# Patient Record
Sex: Female | Born: 1980
Health system: Southern US, Community
[De-identification: ages and names within clinical notes are randomized; demographics above are authoritative.]

## PROBLEM LIST (undated history)

## (undated) DIAGNOSIS — E559 Vitamin D deficiency, unspecified: Secondary | ICD-10-CM

## (undated) DIAGNOSIS — N921 Excessive and frequent menstruation with irregular cycle: Secondary | ICD-10-CM

## (undated) DIAGNOSIS — Z9289 Personal history of other medical treatment: Secondary | ICD-10-CM

## (undated) DIAGNOSIS — I10 Essential (primary) hypertension: Secondary | ICD-10-CM

## (undated) DIAGNOSIS — K760 Fatty (change of) liver, not elsewhere classified: Secondary | ICD-10-CM

## (undated) DIAGNOSIS — E119 Type 2 diabetes mellitus without complications: Secondary | ICD-10-CM

## (undated) DIAGNOSIS — D509 Iron deficiency anemia, unspecified: Secondary | ICD-10-CM

## (undated) DIAGNOSIS — D649 Anemia, unspecified: Secondary | ICD-10-CM

## (undated) DIAGNOSIS — E7800 Pure hypercholesterolemia, unspecified: Secondary | ICD-10-CM

## (undated) HISTORY — PX: ABDOMINAL HYSTERECTOMY: SHX81

## (undated) HISTORY — DX: Iron deficiency anemia, unspecified: D50.9

## (undated) HISTORY — DX: Excessive and frequent menstruation with irregular cycle: N92.1

## (undated) HISTORY — DX: Essential (primary) hypertension: I10

## (undated) HISTORY — PX: HIP PINNING: SHX1757

## (undated) HISTORY — DX: Fatty (change of) liver, not elsewhere classified: K76.0

## (undated) HISTORY — DX: Anemia, unspecified: D64.9

## (undated) HISTORY — PX: TONSILLECTOMY AND ADENOIDECTOMY: SUR1326

## (undated) HISTORY — DX: Vitamin D deficiency, unspecified: E55.9

---

## 2004-08-10 HISTORY — PX: TUBAL LIGATION: SHX77

## 2010-07-22 ENCOUNTER — Emergency Department (HOSPITAL_COMMUNITY)
Admission: EM | Admit: 2010-07-22 | Discharge: 2010-07-22 | Payer: Self-pay | Source: Home / Self Care | Admitting: Emergency Medicine

## 2010-09-22 ENCOUNTER — Emergency Department (HOSPITAL_COMMUNITY)
Admission: EM | Admit: 2010-09-22 | Discharge: 2010-09-23 | Disposition: A | Discharge status: Home / Self Care | Payer: Medicaid Other | Attending: Emergency Medicine | Admitting: Emergency Medicine

## 2010-09-22 ENCOUNTER — Emergency Department (HOSPITAL_COMMUNITY): Payer: Medicaid Other

## 2010-09-22 DIAGNOSIS — D259 Leiomyoma of uterus, unspecified: Secondary | ICD-10-CM | POA: Insufficient documentation

## 2010-09-22 DIAGNOSIS — I10 Essential (primary) hypertension: Secondary | ICD-10-CM | POA: Insufficient documentation

## 2010-09-22 DIAGNOSIS — D649 Anemia, unspecified: Secondary | ICD-10-CM | POA: Insufficient documentation

## 2010-09-22 LAB — URINALYSIS, ROUTINE W REFLEX MICROSCOPIC
Bilirubin Urine: NEGATIVE
Nitrite: NEGATIVE
Specific Gravity, Urine: 1.027 (ref 1.005–1.030)
Urine Glucose, Fasting: NEGATIVE mg/dL
pH: 6 (ref 5.0–8.0)

## 2010-09-22 LAB — CBC
Hemoglobin: 7.7 g/dL — ABNORMAL LOW (ref 12.0–15.0)
MCV: 67.4 fL — ABNORMAL LOW (ref 78.0–100.0)
Platelets: 338 10*3/uL (ref 150–400)
RBC: 3.87 MIL/uL (ref 3.87–5.11)
WBC: 17.2 10*3/uL — ABNORMAL HIGH (ref 4.0–10.5)

## 2010-09-22 LAB — DIFFERENTIAL
Basophils Relative: 0 % (ref 0–1)
Eosinophils Absolute: 0 10*3/uL (ref 0.0–0.7)
Lymphocytes Relative: 13 % (ref 12–46)
Monocytes Relative: 3 % (ref 3–12)
Neutrophils Relative %: 84 % — ABNORMAL HIGH (ref 43–77)

## 2010-09-22 LAB — POCT I-STAT, CHEM 8
BUN: 13 mg/dL (ref 6–23)
Calcium, Ion: 1.15 mmol/L (ref 1.12–1.32)
Chloride: 106 mEq/L (ref 96–112)
Creatinine, Ser: 0.8 mg/dL (ref 0.4–1.2)
Glucose, Bld: 102 mg/dL — ABNORMAL HIGH (ref 70–99)
HCT: 27 % — ABNORMAL LOW (ref 36.0–46.0)
Potassium: 3.7 mEq/L (ref 3.5–5.1)

## 2010-09-22 LAB — WET PREP, GENITAL
Clue Cells Wet Prep HPF POC: NONE SEEN
Trich, Wet Prep: NONE SEEN

## 2010-10-09 ENCOUNTER — Inpatient Hospital Stay (HOSPITAL_COMMUNITY)
Admission: AD | Admit: 2010-10-09 | Discharge: 2010-10-09 | Disposition: A | Discharge status: Home / Self Care | Payer: Medicaid Other | Source: Ambulatory Visit | Attending: Obstetrics & Gynecology | Admitting: Obstetrics & Gynecology

## 2010-10-09 DIAGNOSIS — N949 Unspecified condition associated with female genital organs and menstrual cycle: Secondary | ICD-10-CM | POA: Insufficient documentation

## 2010-10-09 DIAGNOSIS — D259 Leiomyoma of uterus, unspecified: Secondary | ICD-10-CM

## 2010-10-09 DIAGNOSIS — N938 Other specified abnormal uterine and vaginal bleeding: Secondary | ICD-10-CM | POA: Insufficient documentation

## 2010-10-09 LAB — CBC
HCT: 29.8 % — ABNORMAL LOW (ref 36.0–46.0)
Hemoglobin: 8.6 g/dL — ABNORMAL LOW (ref 12.0–15.0)
RBC: 4.36 MIL/uL (ref 3.87–5.11)
RDW: 18.8 % — ABNORMAL HIGH (ref 11.5–15.5)
WBC: 7.3 10*3/uL (ref 4.0–10.5)

## 2010-10-09 LAB — URINE MICROSCOPIC-ADD ON

## 2010-10-09 LAB — URINALYSIS, ROUTINE W REFLEX MICROSCOPIC
Bilirubin Urine: NEGATIVE
Specific Gravity, Urine: 1.015 (ref 1.005–1.030)
pH: 5.5 (ref 5.0–8.0)

## 2010-10-09 LAB — DIFFERENTIAL
Basophils Absolute: 0 10*3/uL (ref 0.0–0.1)
Lymphocytes Relative: 26 % (ref 12–46)
Neutro Abs: 4.6 10*3/uL (ref 1.7–7.7)
Neutrophils Relative %: 64 % (ref 43–77)

## 2010-10-09 LAB — SAMPLE TO BLOOD BANK

## 2010-10-31 ENCOUNTER — Encounter: Payer: Self-pay | Admitting: Oncology

## 2010-11-13 ENCOUNTER — Inpatient Hospital Stay (HOSPITAL_COMMUNITY)
Admission: AD | Admit: 2010-11-13 | Discharge: 2010-11-13 | Disposition: A | Discharge status: Home / Self Care | Payer: Medicaid Other | Source: Ambulatory Visit | Attending: Obstetrics and Gynecology | Admitting: Obstetrics and Gynecology

## 2010-11-13 DIAGNOSIS — N938 Other specified abnormal uterine and vaginal bleeding: Secondary | ICD-10-CM | POA: Insufficient documentation

## 2010-11-13 DIAGNOSIS — N949 Unspecified condition associated with female genital organs and menstrual cycle: Secondary | ICD-10-CM | POA: Insufficient documentation

## 2010-11-13 DIAGNOSIS — D259 Leiomyoma of uterus, unspecified: Secondary | ICD-10-CM | POA: Insufficient documentation

## 2010-11-13 LAB — DIFFERENTIAL
Eosinophils Relative: 2 % (ref 0–5)
Monocytes Absolute: 0.4 10*3/uL (ref 0.1–1.0)
Monocytes Relative: 6 % (ref 3–12)
Neutrophils Relative %: 65 % (ref 43–77)

## 2010-11-13 LAB — CBC
HCT: 25.5 % — ABNORMAL LOW (ref 36.0–46.0)
Platelets: 335 10*3/uL (ref 150–400)
RDW: 18.6 % — ABNORMAL HIGH (ref 11.5–15.5)
WBC: 7.4 10*3/uL (ref 4.0–10.5)

## 2010-11-13 LAB — COMPREHENSIVE METABOLIC PANEL
ALT: 18 U/L (ref 0–35)
AST: 17 U/L (ref 0–37)
Albumin: 3.7 g/dL (ref 3.5–5.2)
Alkaline Phosphatase: 61 U/L (ref 39–117)
Glucose, Bld: 139 mg/dL — ABNORMAL HIGH (ref 70–99)
Potassium: 3.7 mEq/L (ref 3.5–5.1)
Sodium: 142 mEq/L (ref 135–145)
Total Protein: 6.4 g/dL (ref 6.0–8.3)

## 2010-11-15 ENCOUNTER — Inpatient Hospital Stay (HOSPITAL_COMMUNITY)
Admission: AD | Admit: 2010-11-15 | Discharge: 2010-11-15 | Disposition: A | Discharge status: Home / Self Care | Payer: Medicaid Other | Source: Ambulatory Visit | Attending: Obstetrics and Gynecology | Admitting: Obstetrics and Gynecology

## 2010-11-15 DIAGNOSIS — M79609 Pain in unspecified limb: Secondary | ICD-10-CM

## 2010-11-15 DIAGNOSIS — N949 Unspecified condition associated with female genital organs and menstrual cycle: Secondary | ICD-10-CM | POA: Insufficient documentation

## 2010-11-15 DIAGNOSIS — N938 Other specified abnormal uterine and vaginal bleeding: Secondary | ICD-10-CM | POA: Insufficient documentation

## 2010-11-15 LAB — CBC
MCH: 19.8 pg — ABNORMAL LOW (ref 26.0–34.0)
MCHC: 29.1 g/dL — ABNORMAL LOW (ref 30.0–36.0)
MCV: 67.9 fL — ABNORMAL LOW (ref 78.0–100.0)
Platelets: 317 10*3/uL (ref 150–400)
RDW: 18.5 % — ABNORMAL HIGH (ref 11.5–15.5)

## 2010-11-15 LAB — BASIC METABOLIC PANEL
BUN: 9 mg/dL (ref 6–23)
Creatinine, Ser: 0.58 mg/dL (ref 0.4–1.2)
GFR calc non Af Amer: >60 mL/min (ref >60)

## 2010-11-21 ENCOUNTER — Encounter (HOSPITAL_BASED_OUTPATIENT_CLINIC_OR_DEPARTMENT_OTHER): Payer: Medicaid Other | Admitting: Oncology

## 2010-11-21 ENCOUNTER — Other Ambulatory Visit: Payer: Self-pay | Admitting: Oncology

## 2010-11-21 DIAGNOSIS — I10 Essential (primary) hypertension: Secondary | ICD-10-CM

## 2010-11-21 DIAGNOSIS — D259 Leiomyoma of uterus, unspecified: Secondary | ICD-10-CM

## 2010-11-21 DIAGNOSIS — D500 Iron deficiency anemia secondary to blood loss (chronic): Secondary | ICD-10-CM

## 2010-11-21 DIAGNOSIS — N920 Excessive and frequent menstruation with regular cycle: Secondary | ICD-10-CM

## 2010-11-21 LAB — CBC & DIFF AND RETIC
BASO%: 0 % (ref 0.0–2.0)
EOS%: 2.1 % (ref 0.0–7.0)
Immature Retic Fract: 16.3 % — ABNORMAL HIGH (ref 0.00–10.70)
LYMPH%: 19.9 % (ref 14.0–49.7)
MCH: 19.4 pg — ABNORMAL LOW (ref 25.1–34.0)
MCHC: 29 g/dL — ABNORMAL LOW (ref 31.5–36.0)
MONO#: 0.3 10*3/uL (ref 0.1–0.9)
RBC: 3.86 10*6/uL (ref 3.70–5.45)
Retic %: 1.86 % — ABNORMAL HIGH (ref 0.50–1.50)
WBC: 4.3 10*3/uL (ref 3.9–10.3)
lymph#: 0.9 10*3/uL (ref 0.9–3.3)

## 2010-11-21 LAB — COMPREHENSIVE METABOLIC PANEL
Alkaline Phosphatase: 50 U/L (ref 39–117)
Creatinine, Ser: 0.68 mg/dL (ref 0.40–1.20)
Glucose, Bld: 102 mg/dL — ABNORMAL HIGH (ref 70–99)
Sodium: 137 mEq/L (ref 135–145)
Total Bilirubin: 0.5 mg/dL (ref 0.3–1.2)
Total Protein: 6.9 g/dL (ref 6.0–8.3)

## 2010-11-21 LAB — LACTATE DEHYDROGENASE: LDH: 148 U/L (ref 94–250)

## 2010-11-25 LAB — HEMOGLOBINOPATHY EVALUATION
Hemoglobin Other: 0 % (ref 0.0–0.0)
Hgb A2 Quant: 2.4 % (ref 2.2–3.2)
Hgb A: 97.6 % (ref 96.8–97.8)

## 2010-11-25 LAB — IRON AND TIBC: TIBC: 453 ug/dL (ref 250–470)

## 2010-12-01 ENCOUNTER — Encounter (HOSPITAL_BASED_OUTPATIENT_CLINIC_OR_DEPARTMENT_OTHER): Payer: Medicaid Other | Admitting: Oncology

## 2010-12-01 DIAGNOSIS — D509 Iron deficiency anemia, unspecified: Secondary | ICD-10-CM

## 2010-12-03 ENCOUNTER — Encounter (HOSPITAL_COMMUNITY)
Admission: RE | Admit: 2010-12-03 | Discharge: 2010-12-03 | Disposition: A | Discharge status: Home / Self Care | Payer: Medicaid Other | Source: Ambulatory Visit | Attending: Obstetrics and Gynecology | Admitting: Obstetrics and Gynecology

## 2010-12-04 LAB — CBC
HCT: 24.1 % — ABNORMAL LOW (ref 36.0–46.0)
Hemoglobin: 6.8 g/dL — CL (ref 12.0–15.0)
MCH: 19.3 pg — ABNORMAL LOW (ref 26.0–34.0)
MCHC: 28.2 g/dL — ABNORMAL LOW (ref 30.0–36.0)
MCV: 68.3 fL — ABNORMAL LOW (ref 78.0–100.0)
RDW: 18.3 % — ABNORMAL HIGH (ref 11.5–15.5)

## 2010-12-05 ENCOUNTER — Ambulatory Visit (HOSPITAL_COMMUNITY)
Admission: RE | Admit: 2010-12-05 | Discharge: 2010-12-05 | Disposition: A | Discharge status: Home / Self Care | Payer: Medicaid Other | Source: Ambulatory Visit | Attending: Obstetrics and Gynecology | Admitting: Obstetrics and Gynecology

## 2010-12-05 ENCOUNTER — Other Ambulatory Visit: Payer: Self-pay | Admitting: Obstetrics and Gynecology

## 2010-12-05 DIAGNOSIS — Z01818 Encounter for other preprocedural examination: Secondary | ICD-10-CM | POA: Insufficient documentation

## 2010-12-05 DIAGNOSIS — Z01812 Encounter for preprocedural laboratory examination: Secondary | ICD-10-CM | POA: Insufficient documentation

## 2010-12-05 DIAGNOSIS — D250 Submucous leiomyoma of uterus: Secondary | ICD-10-CM | POA: Insufficient documentation

## 2010-12-05 DIAGNOSIS — N920 Excessive and frequent menstruation with regular cycle: Secondary | ICD-10-CM | POA: Insufficient documentation

## 2010-12-05 DIAGNOSIS — D649 Anemia, unspecified: Secondary | ICD-10-CM | POA: Insufficient documentation

## 2010-12-18 NOTE — H&P (Signed)
NAMESHAKEETA, GODETTE                ACCOUNT NO.:  [id number]  MEDICAL RECORD NO.:  [id number]           PATIENT TYPE:  LOCATION:                                 FACILITY:  PHYSICIAN:  Allyssia Skluzacek A. Travia Onstad, M.D. DATE OF BIRTH:  11/23/1980  DATE OF ADMISSION:  12/05/2010 DATE OF DISCHARGE:                             HISTORY & PHYSICAL   DIAGNOSES:  Menometrorrhagia, submucosal fibroid, and endometrial mass.  The patient is a 30 year old female, gravida 6, para 3 who presented to me back in March complaining of heavy vaginal bleeding since February 2012.  In March after taking Provera, she started bleeding and has not stopped bleeding since that time.  States she has had severe pelvic pain and clotting.  States she also has known uterine fibroids, currently not on any contraception or hormone therapy.  She is not started on any new medications.  She has no menopausal symptoms or vaginal discharge and no increased stress.  The patient was given Provera 10 mg twice a day and despite that her bleeding did slow down, but she still soaks through 2-3 pads a day.  She occasionally has nausea and vomiting with it.  She had ultrasound and sonohysterogram which was significant for uterus measuring 10.8 x 6.8 x 7.73.  Uterus is anteverted with normal ovaries. She had a submucosal 3.74-cm hot fibroid and endometrial mass measuring 1.7 x 2.81 cm.  PAST MEDICAL HISTORY:  Significant for chronic hypertension.  MEDICATIONS:  Provera, iron, and lisinopril.  The patient has no known drug allergies.  PAST SURGICAL HISTORY:  Significant for cesarean section x3, tubal ligation, tonsillectomy, and bilateral hip surgery.  The patient has occasional alcohol use.  No illicit drug use and smokes 4 cigarettes per day.  REVIEW OF SYSTEMS:  GENITOURINARY:  Significant for irregular bleeding and fibroids.  CARDIOVASCULAR:  Significant for chronic hypertension. ENDOCRINE:  No thyroid disease.   MUSCULOSKELETAL:  No weakness.  PHYSICAL EXAMINATION:  VITAL SIGNS:  Her blood pressure is 120/78.  She weighs 316 pounds.  She is 5 feet and 7 inches. HEENT:  Pupils are equal.  Hearing is normal.  Throat is clear. NECK:  Thyroid is not enlarged. HEART:  Regular rate and rhythm. LUNGS:  Clear to auscultation bilaterally. BREASTS:  No masses, discharge, skin change, or nipple retraction. BACK:  No CVA tenderness. GI:  Nontender without any mass or organomegaly. MUSCULOSKELETAL:  No cyanosis, clubbing, or edema. NEURO:  Within normal limits. PELVIS:  Full vaginal exam is within normal limits.  Uterus is difficult to tell the size secondary to body habitus.  Adnexa has no masses.  ASSESSMENT:  Menometrorrhagia, dysmenorrhea, submucosal fibroid, and endometrial mass.  Her hemoglobin is 7.8.  TSH and prolactin are normal.  The patient is given the option of observation and D and C with and without endometrial ablation, hysteroscopy hysterectomy or even try Lupron or birth control pills, but the patient is a smoker so she did not want to take the risk with birth control pills and obesity.  She desires to have a D and C, hysteroscopy, and removal of submucosal fibroid and endometrial mass  with ThermaChoice ablation.  She understands the risks but are not limited to bleeding, infection, damage to internal organs such as bowel, bladder, major blood vessels.  She understands that she may need a second surgery to totally remove the fibroid.     Rachel Dawson A. Normand Sloop, M.D.     Rachel Dawson  D:  12/04/2010  T:  12/05/2010  Job:  [id number]  Electronically Signed by Rachel Dawson M.D. on 12/18/2010 11:17:03 PM

## 2010-12-18 NOTE — Op Note (Signed)
Rachel Dawson, Rachel Dawson            ACCOUNT NO.:  [id number]  MEDICAL RECORD NO.:  [id number]           PATIENT TYPE:  O  LOCATION:  [facility]                          [facility]  [facility]  PHYSICIAN:  Jakevion Arney A. Zahraa Bhargava, M.D. DATE OF BIRTH:  02-Apr-1981  DATE OF PROCEDURE:  12/05/2010 DATE OF DISCHARGE:                              OPERATIVE REPORT   PREOPERATIVE DIAGNOSES:  Menometrorrhagia, anemia, uterine submucosal fibroid.  POSTOPERATIVE DIAGNOSES:  Menometrorrhagia, anemia, uterine submucosal fibroid.  PROCEDURES:  Dilation and curettage, hysteroscopy, resection of fibroid, attempted ThermaChoice ablation.  SURGEON:  Madalene Mickler A. Kortney Potvin, MD  ASSISTANTS:  None.  ANESTHESIA:  General and local.  FINDINGS:  Anteverted uterus, 3-cm fibroid.  Both ostia were visualized. There was a large clot seen.  SPECIMENS:  Endometrial curettings and pieces of fibroids sent to Pathology.  ESTIMATED BLOOD LOSS:  Minimal.  ESTIMATED BLOOD LOSS:  Minimal.  FLUID DEFICIT:  385 mL of normal saline, 250 mL of D5W.  Complications were none.  The patient went to PACU in stable condition.  PROCEDURE IN DETAIL:  The patient was taken to the operating room, placed in dorsal lithotomy position and prepped and draped in a normal sterile fashion.  A bivalve speculum was placed into the vagina.  The anterior lip of cervix was grasped with single-tooth tenaculum.  Before this was done, an in-and-out catheter was used to drain the bladder. The uterus did sound to 10 cm.  The cervix was dilated with Pratt dilators up to 21.  The hysteroscope was placed into the uterine cavity and there was a large clot which did distort some of the view of the anterior wall of the uterus.  I did see the submucosal fibroids 3 cm that did not appear to be on a stalk.  No polyps were seen and both ostia were visualized.  The hysteroscope was removed.  The resectoscope was then assembled and the VersaPoint loop was used to remove  some of the fibroid to sent to Pathology.  I then use a vaporizer to vaporize, I would say, about 70% of the fibroid.  I did not vaporize the entire fibroid as I did not want to perforate the uterus and get to the bottom of it, it was submucosal, not on the stalk.  I then did a sharp curetting and the endometrial curettings were sent to Pathology.  I then washed out the uterus and did another hysteroscopy.  Before washing the uterus out with D5W, there was 385 mL deficit of normal saline.  I then washed out the uterus with D5W for 3 minutes looking, there was no signs of perforation.  We then set up the ThermaChoice, ThermaChoice rep was there and I put the balloon in the uterus.  It did sound to 10 cm and could not maintain pressure in the balloon.  The cavity did not appear large on the hysteroscope, so I was concerned that there was a perforation.  I took the ThermaChoice balloon out.  I then re-did the hysteroscopy.  The cervix was normal.  The uterus was normal and together I looked at the entire fibroid and I said the  entire fibroid was not removed but there were no signs of perforation in any part of the fibroid.  I looked at the ostia, still no areas of perforation that I could see and the deficit with D5W was 250 mL which is also not consistent with a perforation.  I then removed the balloon, tried another one and again we just could not maintain the pressure.  I thought was that the cavity may just be bigger than we thought, but again I did not see any signs of perforation.  After the second balloon failed, I looked once, did look through the endocervix canal, the uterus, looked at the fibroid in detail that was mostly resected, both ostia, all walls of the uterus, most of the clot had been removed with polyp forceps in between doing the hysteroscopy and the hysteroscope myomectomy.  Again no signs of perforation.  I am not sure why the ThermaChoice did not work, but I did not  want to attempt to use any other heat in case there is a small perforation that I could not see, watched otherwise the patient carefully and explained to her in detail.     [name], M.D.     NAD/MEDQ  D:  12/05/2010  T:  12/06/2010  Job:  [id number]  Electronically Signed by Jaymes Graff M.D. on 12/18/2010 11:17:52 PM

## 2011-01-19 ENCOUNTER — Other Ambulatory Visit: Payer: Self-pay | Admitting: Oncology

## 2011-01-19 ENCOUNTER — Encounter (HOSPITAL_BASED_OUTPATIENT_CLINIC_OR_DEPARTMENT_OTHER): Payer: Medicaid Other | Admitting: Oncology

## 2011-01-19 DIAGNOSIS — D500 Iron deficiency anemia secondary to blood loss (chronic): Secondary | ICD-10-CM

## 2011-01-19 LAB — CBC WITH DIFFERENTIAL/PLATELET
BASO%: 0.3 % (ref 0.0–2.0)
HCT: 35.2 % (ref 34.8–46.6)
LYMPH%: 24.8 % (ref 14.0–49.7)
MCHC: 31.3 g/dL — ABNORMAL LOW (ref 31.5–36.0)
MCV: 78 fL — ABNORMAL LOW (ref 79.5–101.0)
MONO#: 0.3 10*3/uL (ref 0.1–0.9)
MONO%: 4.6 % (ref 0.0–14.0)
NEUT%: 68.3 % (ref 38.4–76.8)
Platelets: 291 10*3/uL (ref 145–400)
RBC: 4.51 10*6/uL (ref 3.70–5.45)
WBC: 7.4 10*3/uL (ref 3.9–10.3)

## 2011-01-19 LAB — FERRITIN: Ferritin: 18 ng/mL (ref 10–291)

## 2011-01-23 ENCOUNTER — Inpatient Hospital Stay (HOSPITAL_COMMUNITY)
Admission: AD | Admit: 2011-01-23 | Discharge: 2011-01-24 | Disposition: A | Discharge status: Home / Self Care | Payer: Medicaid Other | Source: Ambulatory Visit | Attending: Obstetrics and Gynecology | Admitting: Obstetrics and Gynecology

## 2011-01-23 DIAGNOSIS — N938 Other specified abnormal uterine and vaginal bleeding: Secondary | ICD-10-CM | POA: Insufficient documentation

## 2011-01-23 DIAGNOSIS — N949 Unspecified condition associated with female genital organs and menstrual cycle: Secondary | ICD-10-CM | POA: Insufficient documentation

## 2011-01-23 DIAGNOSIS — D649 Anemia, unspecified: Secondary | ICD-10-CM | POA: Insufficient documentation

## 2011-01-23 DIAGNOSIS — I10 Essential (primary) hypertension: Secondary | ICD-10-CM | POA: Insufficient documentation

## 2011-01-23 LAB — CBC
HCT: 32.8 % — ABNORMAL LOW (ref 36.0–46.0)
Hemoglobin: 10.1 g/dL — ABNORMAL LOW (ref 12.0–15.0)
MCH: 24.9 pg — ABNORMAL LOW (ref 26.0–34.0)
MCHC: 30.8 g/dL (ref 30.0–36.0)
RDW: 22.7 % — ABNORMAL HIGH (ref 11.5–15.5)

## 2011-01-24 LAB — POCT PREGNANCY, URINE: Preg Test, Ur: NEGATIVE

## 2011-01-30 ENCOUNTER — Emergency Department (HOSPITAL_COMMUNITY)
Admission: EM | Admit: 2011-01-30 | Discharge: 2011-01-31 | Discharge status: Against Medical Advice | Payer: 59 | Attending: Emergency Medicine | Admitting: Emergency Medicine

## 2011-01-30 DIAGNOSIS — N898 Other specified noninflammatory disorders of vagina: Secondary | ICD-10-CM | POA: Insufficient documentation

## 2011-02-02 ENCOUNTER — Inpatient Hospital Stay (HOSPITAL_COMMUNITY)
Admission: AD | Admit: 2011-02-02 | Discharge: 2011-02-02 | Disposition: A | Discharge status: Home / Self Care | Payer: 59 | Source: Ambulatory Visit | Attending: Obstetrics and Gynecology | Admitting: Obstetrics and Gynecology

## 2011-02-02 ENCOUNTER — Inpatient Hospital Stay (HOSPITAL_COMMUNITY): Payer: 59

## 2011-02-02 DIAGNOSIS — E669 Obesity, unspecified: Secondary | ICD-10-CM | POA: Insufficient documentation

## 2011-02-02 DIAGNOSIS — D259 Leiomyoma of uterus, unspecified: Secondary | ICD-10-CM | POA: Insufficient documentation

## 2011-02-02 DIAGNOSIS — I10 Essential (primary) hypertension: Secondary | ICD-10-CM | POA: Insufficient documentation

## 2011-02-02 DIAGNOSIS — D649 Anemia, unspecified: Secondary | ICD-10-CM | POA: Insufficient documentation

## 2011-02-02 DIAGNOSIS — N920 Excessive and frequent menstruation with regular cycle: Secondary | ICD-10-CM | POA: Insufficient documentation

## 2011-02-02 LAB — DIFFERENTIAL
Basophils Relative: 0 % (ref 0–1)
Eosinophils Absolute: 0.1 10*3/uL (ref 0.0–0.7)
Eosinophils Relative: 2 % (ref 0–5)
Lymphs Abs: 2 10*3/uL (ref 0.7–4.0)
Monocytes Relative: 6 % (ref 3–12)

## 2011-02-03 NOTE — Consult Note (Addendum)
NAMEZOIE, SARIN               ACCOUNT NO.:  [id number]  MEDICAL RECORD NO.:  [id number]  LOCATION:  [facility]                         [facility]ACILITY:  [facility]  PHYSICIAN:  Janine Limbo, M.D.DATE OF BIRTH:  January 13, 1981  DATE OF CONSULTATION:  02/02/2011 DATE OF DISCHARGE:  02/02/2011                                CONSULTATION   HISTORY OF PRESENT ILLNESS:  Ms. Melling is a 30 year old female, para 3-0- 3-3, who presents with menorrhagia and anemia.  The patient has been followed at the [facility].  The patient has a known history of submucosal fibroid.  The patient has a long history of menorrhagia, and her hemoglobin has been as low as 6.5.  The patient underwent a hysteroscopy with resection of a submucosal fibroid and a dilatation and curettage.  She has continued to have heavy bleeding.  She has been treated with progestin therapy by mouth and a Nexplanon procedure.  She has also been given estrogen by mouth.  Her bleeding continues to be extremely heavy with very large clots.  The patient has had 3 cesarean deliveries.  She has had a tubal ligation in the past.  PAST MEDICAL HISTORY:  The patient has a history of chronic hypertension.  She also has had a tonsillectomy and bilateral hip surgery.  The patient is obese.  CURRENT MEDICATIONS:  Lisinopril, iron, Provera, and estrogen tablets.  DRUG ALLERGIES:  No known drug allergies.  SOCIAL HISTORY:  The patient smokes 4 cigarettes each day.  She drinks alcohol socially.  She denies other recreational drug uses.  REVIEW OF SYSTEMS:  Please see history of present illness.  FAMILY HISTORY:  Noncontributory.  PHYSICAL EXAMINATION:  VITAL SIGNS:  Blood pressure is 149/81, pulse of 84, respirations 16, and temperature is 98.1. HEENT:  Within normal limits except for a patient that is frustrated. CHEST:  Clear. HEART:  Regular rate and rhythm. ABDOMEN:   Nontender. EXTREMITIES:  Grossly normal. NEUROLOGICAL:  Grossly normal. PELVIC:  External genitalia is normal.  Vagina is normal except for bright red blood in the vaginal vault.  Cervix is closed and no lesions are present.  Uterus is difficult to outline.  Adnexa no masses.  LABORATORY DATA:  Laboratory values hemoglobin is 8.5 (10.1 on January 24, 2011), hematocrit is 27.3%, platelet count is 309,000.  White blood cell count is 8000.  Ultrasound showed the uterus to be 12.8 x 8.9 cm.  There is a mass within the uterus that measures approximately 5.5 cm in size. This is consistent with a submucosal fibroid.  Ovaries are within normal limits.  There are fibroids noted at the fundus of the uterus measuring 3.8 cm.  ASSESSMENT: 1. Menorrhagia. 2. Anemia. 3. Fibroids. 4. Obesity. 5. Hypertension.  PLAN:  A long discussion was held with the patient and her support person.  We discussed our management options which included continued attempts at outpatient management using hormonal therapy.  We discussed uterine artery embolization.  We discussed endometrial ablation.  We discussed Lysteda therapy.  We also discussed hysterectomy.  The patient states that she no longer desires childbearing potential, and at this point she is ready to  proceed with hysterectomy.  I have recommended that the patient come into the hospital for IV estrogen therapy.  She reports that because of social issues, she is not able to do that.  We will therefore give the patient 25 mg of Premarin intramuscularly today. She will return to the hospital on February 03, 2011, for continued estrogen therapy.  She wishes to consider hysterectomy during that hospitalization if at all possible.     Janine Limbo, M.D.     AVS/MEDQ  D:  02/02/2011  T:  02/03/2011  Job:  [id number]  Electronically Signed by Kirkland Hun M.D. on 02/13/2011 07:24:55 AM

## 2011-02-05 ENCOUNTER — Encounter (HOSPITAL_COMMUNITY)
Admission: RE | Admit: 2011-02-05 | Discharge: 2011-02-05 | Disposition: A | Discharge status: Home / Self Care | Payer: Medicaid Other | Source: Ambulatory Visit | Attending: Obstetrics and Gynecology | Admitting: Obstetrics and Gynecology

## 2011-02-05 LAB — BASIC METABOLIC PANEL
CO2: 24 mEq/L (ref 19–32)
Calcium: 9.1 mg/dL (ref 8.4–10.5)
Creatinine, Ser: 0.58 mg/dL (ref 0.50–1.10)
Glucose, Bld: 121 mg/dL — ABNORMAL HIGH (ref 70–99)

## 2011-02-05 LAB — CBC
HCT: 25.6 % — ABNORMAL LOW (ref 36.0–46.0)
Hemoglobin: 7.8 g/dL — ABNORMAL LOW (ref 12.0–15.0)
MCH: 24.7 pg — ABNORMAL LOW (ref 26.0–34.0)
MCV: 81 fL (ref 78.0–100.0)
RBC: 3.16 MIL/uL — ABNORMAL LOW (ref 3.87–5.11)

## 2011-02-09 ENCOUNTER — Other Ambulatory Visit: Payer: Self-pay | Admitting: Obstetrics and Gynecology

## 2011-02-09 ENCOUNTER — Ambulatory Visit (HOSPITAL_COMMUNITY)
Admission: RE | Admit: 2011-02-09 | Discharge: 2011-02-10 | Disposition: A | Discharge status: Home / Self Care | Payer: Medicaid Other | Source: Ambulatory Visit | Attending: Obstetrics and Gynecology | Admitting: Obstetrics and Gynecology

## 2011-02-09 DIAGNOSIS — Z3046 Encounter for surveillance of implantable subdermal contraceptive: Secondary | ICD-10-CM | POA: Insufficient documentation

## 2011-02-09 DIAGNOSIS — Y9210 Unspecified residential institution as the place of occurrence of the external cause: Secondary | ICD-10-CM | POA: Insufficient documentation

## 2011-02-09 DIAGNOSIS — N920 Excessive and frequent menstruation with regular cycle: Secondary | ICD-10-CM | POA: Insufficient documentation

## 2011-02-09 DIAGNOSIS — D252 Subserosal leiomyoma of uterus: Secondary | ICD-10-CM | POA: Insufficient documentation

## 2011-02-09 DIAGNOSIS — D250 Submucous leiomyoma of uterus: Secondary | ICD-10-CM | POA: Insufficient documentation

## 2011-02-09 DIAGNOSIS — D649 Anemia, unspecified: Secondary | ICD-10-CM | POA: Insufficient documentation

## 2011-02-09 DIAGNOSIS — IMO0002 Reserved for concepts with insufficient information to code with codable children: Secondary | ICD-10-CM | POA: Insufficient documentation

## 2011-02-09 DIAGNOSIS — E669 Obesity, unspecified: Secondary | ICD-10-CM | POA: Insufficient documentation

## 2011-02-09 DIAGNOSIS — I10 Essential (primary) hypertension: Secondary | ICD-10-CM | POA: Insufficient documentation

## 2011-02-09 DIAGNOSIS — D251 Intramural leiomyoma of uterus: Secondary | ICD-10-CM | POA: Insufficient documentation

## 2011-02-09 LAB — CBC
HCT: 25.7 % — ABNORMAL LOW (ref 36.0–46.0)
Hemoglobin: 7.8 g/dL — ABNORMAL LOW (ref 12.0–15.0)
MCH: 24.3 pg — ABNORMAL LOW (ref 26.0–34.0)
MCHC: 30.4 g/dL (ref 30.0–36.0)
RBC: 3.21 MIL/uL — ABNORMAL LOW (ref 3.87–5.11)

## 2011-02-09 LAB — ABO/RH: ABO/RH(D): O POS

## 2011-02-10 LAB — CROSSMATCH
ABO/RH(D): O POS
Unit division: 0

## 2011-02-10 LAB — CBC
Hemoglobin: 8.9 g/dL — ABNORMAL LOW (ref 12.0–15.0)
MCH: 25.1 pg — ABNORMAL LOW (ref 26.0–34.0)
MCHC: 31.4 g/dL (ref 30.0–36.0)
Platelets: 375 10*3/uL (ref 150–400)

## 2011-02-11 NOTE — Op Note (Signed)
NAMEGEARL, KIMBROUGH               ACCOUNT NO.:  [id number]  MEDICAL RECORD NO.:  [id number]  LOCATION:  9316                          [facility]ILITY:  [facility]  PHYSICIAN:  Alantra Popoca A. Tsuyako Jolley, M.D. DATE OF BIRTH:  Feb 24, 1981  DATE OF PROCEDURE:  02/09/2011 DATE OF DISCHARGE:                              OPERATIVE REPORT   PREOPERATIVE DIAGNOSES: 1. Menorrhagia. 2. Severe anemia. 3. Uterine fibroids. 4. Nexplanon contraceptive implant.  POSTOPERATIVE DIAGNOSES: 1. Menorrhagia. 2. Severe anemia. 3. Uterine fibroids. 4. Nexplanon contraceptive implant.  PROCEDURES: 1. Laparoscopic-assisted vaginal hysterectomy. 2. Cystoscopy. 3. Removal of Nexplanon. 4. Repair of vaginal laceration and vaginal packing.  SURGEON:  Stevens Magwood A. Normand Sloop, MD  ASSISTANT:  Marquis Lunch. Lowell Guitar, PA.  ANESTHESIA:  General and local.  FINDINGS:  A fibroid uterus with some filmy bladder adhesions, normal- appearing tubes and ovaries.  The uterus weighed 377 grams and sent to pathology.  ESTIMATED BLOOD LOSS:  300 mL.  IV FLUIDS:  Two units of packed red blood cells as the preoperative hemoglobin was 7.  URINE OUTPUT:  700 mL.  COMPLICATIONS:  None.  DISPOSITION:  The patient went to the recovery room in stable condition.  The patient understood the risks to be but not limited to bleeding, infection, damage to internal organs such as bowel, bladder, and major blood vessels.  I reviewed this with her.  She consented to obtaining blood before the procedure.  DESCRIPTION OF PROCEDURE:  The patient was taken to the operating room where she was placed in dorsal lithotomy position and prepped and draped in normal sterile fashion.  A Foley catheter was placed into the bladder.  A bivalve speculum placed into the vagina.  The anterior lip of cervix was grasped with a single-tooth tenaculum and a Hulka manipulator was placed into the uterine cavity.  All instruments to the size of the Hulka were removed.   Attention was then turned to the umbilicus where 5 mL of 0.25% Marcaine was placed around the umbilicus and a 10-mm infraumbilical incision was made with a scalpel and carried down to the fascia.  The fascia was then incised and the midline extended bilaterally.  The peritoneum was identified, tented up, and entered sharply and without difficulty the fascia was circumscribed with 0-Vicryl.  The Hasson was placed and anchored to the fascial stitch. Intra-abdominal placement was confirmed with the laparoscope after the abdomen was filled with CO2 gas.  The patient had a large fibroid uterus, normal adnexa, and normal abdominal anatomy.  Two 5-mm trocars were placed in the right and left lower quadrants with being careful not to puncture the inferior epigastric arteries and vessels.  Then the patient's right and left utero-ovarian ligaments were cauterized and cut.  Hemostasis was assured.  Both round ligaments were cauterized and cut.  The bladder flap was then made by hydrodissection and then cut using the gyrus.  The bladder flap was removed away.  Attention was then turned to the vagina where 20 mL of Pitressin mixture was placed around the cervix in a circumferential manner.  The cervix was then cut with a knife in a circumferential manner and the vaginal mucosa posterior and inferior mucosa was dissected  off the cervix.  The anterior-posterior cul-de-sac was then entered without difficulty.  The uterosacral ligaments were clamped with Heaney clamps, cut, and suture ligated.  The cardinal ligaments were clamped, cut, and suture ligated.  Hemostasis was assured.  The uterus was then removed without difficulty and weighed.  There was some bleeding on the patient's left side of the vaginal wall from within the pedicles which were made hemostatic with a figure-of-eight suture.  Irrigation was done.  Hemostasis was assured. McCall suture was placed.  The two uterosacral ligament angles  were placed on the free needle.  The suture was placed in the free needle and anchored to the vagina.  The vaginal mucosa was then closed using 0- Vicryl in a figure-of-eight interrupted sutures.  The McCall suture was then tied after the vaginal retractor was removed.  There was a small laceration just above the introitus which was bleeding.  This was made hemostatic with figure-of-eight 0-Vicryl suture.  Cystoscopy was then done.  The patient's given indigo carmine.  The bladder was noted to be normal.  Both ureteral orifices were seen to the efflux.  Attention was then turned back to the abdomen which was insufflated with CO2 gas. Irrigation was done.  There was some bleeding from one of the pedicles on the left side to be of a vessel.  This was then cauterized with the gyrus.  Irrigation was done again and it was noted to be hemostatic. There was slight ooze from the cuff when Surgicel was placed along the cuff.  All instruments were removed without difficulty.  The umbilical suture was reapproximated by tying the suture that was already placed. The two small incisions were closed using Dermabond.  The skin incision at the umbilicus was closed using 3-0 Monocryl in a subcuticular fashion.  I did another cystoscopy because of the bleeding pedicle. Again, the bladder was intact.  Both ureters were seen to efflux. Attention was then turned to the patient's left arm where 3 mL of Marcaine was placed after Betadine was used and Nexplanon was removed without difficulty.  Steri-Strip was used to close the incision line. Sponge, lap, and needle counts were correct.  The patient went to recovery room in stable condition.     Brit Wernette A. Normand Sloop, M.D.     NAD/MEDQ  D:  02/09/2011  T:  02/10/2011  Job:  [id number]  Electronically Signed by Jaymes Graff M.D. on 02/11/2011 01:06:49 PM

## 2011-02-18 NOTE — H&P (Signed)
NAME:  Burks, Meribeth                    ACCOUNT NO.:  MEDICAL RECORD NO.:  [id number]  LOCATION:                                 FACILITY:  PHYSICIAN:  Naima A. Dillard, M.D. DATE OF BIRTH:  1981/04/25  DATE OF ADMISSION: DATE OF DISCHARGE:                             HISTORY & PHYSICAL   HISTORY OF PRESENT ILLNESS:  Ms. Rachel Dawson is a 30 year old single black female para 3-0-3-3, presenting for laparoscopically-assisted vaginal hysterectomy because of refractory menorrhagia and severe anemia.  Since February 2012, the patient has bled continuously, requiring her to change a Poise pad every 1-1-1/2 hours, accompanied by heavy clotting. The patient was tried on Provera, however, had no response to that therapy.  An ultrasound showed that she had a uterus measuring 10.8 x 7.79 x 6.84 cm and a submucosal fibroid measuring 3.74 x 3.73 x 3.10 cm. A sonohysterogram showed further delineation of the submucosal fibroid which appeared to be on a stalk, measuring 3.79 x 4.25 x 4.97 cm.  The patient had an additional endometrial mass that measured 1.23 x 1.73 x 2.81 cm and her ovaries appeared normal on that study.  Subsequently, the patient underwent hysteroscopy, D and C with resection of submucosal fibroid and an attempted ThermaChoice ablation, however, the ablation was not successful.  Subsequently, the patient continued to bleed and received a Hematology consult that concluded her bleeding was probably GYN related (i.e. fibroids) and was started on iron infusions to replenish her iron stores and normalize her hemoglobin.  The patient was then given Nexplanon intradermal progestin device to help to manage her bleeding, but again, her bleeding continued.  She admits to back pain, leg cramps, vaginitis symptoms, and mild urinary frequency, but denies any changes in her bowel movements.  Given the protracted nature of her symptoms and its lack of response to various therapies along with her  persistent anemia, the patient has decided to proceed with definitive therapy in the form of hysterectomy.  PAST MEDICAL HISTORY:  OB History:  Gravida 6, para 3-0-3-3.  The patient had 3 cesarean sections. GYN History:  Menarche 30 years old, last menstrual period in (bleeding since February 2012).  The patient uses bilateral tubal ligation as her method of contraception.  She denies any history of sexually transmitted diseases or abnormal Pap smears.  Last normal Pap smear was May 2011.  MEDICAL HISTORY:  Hypertension, anemia, uterine fibroids.  SURGICAL HISTORY:  In 1988, tonsillectomy.  In 1990, bilateral pins placed in both of her hips.  In 2006, bilateral tubal ligation.  In April 2012, hysteroscopy D and C with attempted ThermaChoice endometrial ablation.  The patient does have a history of blood transfusions during the time she had a tonsillectomy.  Denies any problems with anesthesia.  FAMILY HISTORY:  Unknown due to the fact she is adopted.  SOCIAL HISTORY:  The patient works as a Research scientist (medical) at [employer]. She is single.  HABITS:  She does use tobacco.  Occasionally, consumes alcohol.  Denies any illicit drug use.  CURRENT MEDICATIONS:  1  Lisinopril 20/25 mg daily. 1. Naproxen 500 mg twice daily as needed. 2. Vicodin as needed. 3.  Estradiol 2 mg twice daily. 4. Iron infusions every 2 months.  ALLERGIES:  The patient is allergic to LATEX, it causes a rash, but denies any known drug allergies.  She has no sensitivity to peanuts, soy, or shellfish.  REVIEW OF SYSTEMS:  The patient does wear glasses.  Denies any chest pain, shortness of breath, nausea, vomiting, diarrhea, but does admit to occasional frontal headaches without photophobia, nausea, or unilateral weakness.  She denies any skin rashes, any cough, any joint swelling, myalgias, arthralgias, and except as is mentioned in history present illness, the patient's review of systems is otherwise  negative.  PHYSICAL EXAMINATION:  VITAL SIGNS:  Blood pressure is 122/82, pulse is 80, respirations 16, temperature 98.1 degrees Fahrenheit orally, weight 316 pounds, height 5 feet 2 inches tall.  Body mass index is 50. HEENT:  Head, the patient is hirsute. NECK:  Supple without masses.  There is no thyromegaly or cervical adenopathy. HEART:  Regular rate and rhythm. LUNGS:  Clear. BACK:  No CVA tenderness. ABDOMEN:  No tenderness, masses, or organomegaly. EXTREMITIES:  No clubbing, cyanosis, or edema. PELVIC:  EG/BUS is normal.  Vagina is normal.  There is scant amount of blood in the vaginal vault.  Cervix is nontender without lesions. Uterus appears normal size, shape, and consistency without tenderness though exam is limited by body habitus.  Adnexa, no tenderness or masses.  IMPRESSION: 1. Menorrhagia. 2. Anemia. 3. Uterine fibroids. 4. Nexplanon.  DISPOSITION:  A discussion was held with the patient regarding indications for her procedure along with their risks which include but are not limited to reaction to anesthesia, damage to adjacent organs, infection, excessive bleeding, and the possibility of an open abdominal excision may be necessary to complete her surgery safely.  The patient was given the MiraLax bowel prep to be completed 24 hours prior to her procedure.  She has consented to proceed with a laparoscopically- assisted vaginal hysterectomy with the possibility of a total abdominal hysterectomy along with removal of Nexplanon intradermal in device at [facility] on February 09, 2011, at 12 o'clock noon.     Rashanna Christiana J. Lowell Guitar, P.A.-C   ______________________________ Pierre Bali Normand Sloop, M.D.   EJP/MEDQ  D:  02/13/2011  T:  02/14/2011  Job:  [id number]

## 2011-03-03 ENCOUNTER — Encounter (HOSPITAL_BASED_OUTPATIENT_CLINIC_OR_DEPARTMENT_OTHER): Payer: Medicaid Other | Admitting: Oncology

## 2011-03-03 ENCOUNTER — Other Ambulatory Visit: Payer: Self-pay | Admitting: Oncology

## 2011-03-03 DIAGNOSIS — D509 Iron deficiency anemia, unspecified: Secondary | ICD-10-CM

## 2011-03-03 LAB — CBC WITH DIFFERENTIAL/PLATELET
Basophils Absolute: 0 10*3/uL (ref 0.0–0.1)
Eosinophils Absolute: 0.2 10*3/uL (ref 0.0–0.5)
HCT: 31.8 % — ABNORMAL LOW (ref 34.8–46.6)
HGB: 9.8 g/dL — ABNORMAL LOW (ref 11.6–15.9)
LYMPH%: 21.7 % (ref 14.0–49.7)
MCV: 77.8 fL — ABNORMAL LOW (ref 79.5–101.0)
MONO%: 4.1 % (ref 0.0–14.0)
NEUT#: 5.5 10*3/uL (ref 1.5–6.5)
NEUT%: 71.8 % (ref 38.4–76.8)
Platelets: 374 10*3/uL (ref 145–400)

## 2011-03-04 ENCOUNTER — Ambulatory Visit: Admit: 2011-03-04 | Payer: Self-pay | Admitting: Obstetrics and Gynecology

## 2011-03-04 SURGERY — HYSTERECTOMY, VAGINAL, LAPAROSCOPY-ASSISTED
Anesthesia: General

## 2011-07-16 ENCOUNTER — Encounter: Payer: Self-pay | Admitting: *Deleted

## 2011-07-17 ENCOUNTER — Encounter: Payer: Self-pay | Admitting: Oncology

## 2011-07-17 DIAGNOSIS — N921 Excessive and frequent menstruation with irregular cycle: Secondary | ICD-10-CM | POA: Insufficient documentation

## 2011-07-17 DIAGNOSIS — D259 Leiomyoma of uterus, unspecified: Secondary | ICD-10-CM | POA: Insufficient documentation

## 2011-07-17 DIAGNOSIS — D509 Iron deficiency anemia, unspecified: Secondary | ICD-10-CM | POA: Insufficient documentation

## 2011-07-17 DIAGNOSIS — I10 Essential (primary) hypertension: Secondary | ICD-10-CM | POA: Insufficient documentation

## 2011-07-24 ENCOUNTER — Other Ambulatory Visit: Payer: Medicaid Other

## 2011-07-24 ENCOUNTER — Ambulatory Visit: Payer: Medicaid Other | Admitting: Oncology

## 2011-10-28 ENCOUNTER — Emergency Department (HOSPITAL_COMMUNITY)
Admission: EM | Admit: 2011-10-28 | Discharge: 2011-10-28 | Discharge status: Against Medical Advice | Payer: 59 | Attending: Emergency Medicine | Admitting: Emergency Medicine

## 2011-10-28 ENCOUNTER — Encounter (HOSPITAL_COMMUNITY): Payer: Self-pay | Admitting: *Deleted

## 2011-10-28 DIAGNOSIS — M25569 Pain in unspecified knee: Secondary | ICD-10-CM | POA: Insufficient documentation

## 2011-10-28 DIAGNOSIS — R42 Dizziness and giddiness: Secondary | ICD-10-CM | POA: Insufficient documentation

## 2011-10-28 DIAGNOSIS — R51 Headache: Secondary | ICD-10-CM | POA: Insufficient documentation

## 2011-10-28 DIAGNOSIS — R110 Nausea: Secondary | ICD-10-CM | POA: Insufficient documentation

## 2011-10-28 NOTE — ED Notes (Signed)
No answer

## 2011-10-28 NOTE — ED Notes (Signed)
No answer x1

## 2011-10-28 NOTE — ED Notes (Signed)
Called for patient in waiting room for a reassessment of vitals; no answer

## 2011-10-28 NOTE — ED Notes (Signed)
Pt states that starting yesterday she began frontal headache associated with nausea and fatigue. Reports i think my blood pressure is high but i took my meds. Pt states that she is having swelling to her right lower leg. Pt denies any sob or chest pain.

## 2012-05-23 ENCOUNTER — Emergency Department (HOSPITAL_COMMUNITY)
Admission: EM | Admit: 2012-05-23 | Discharge: 2012-05-23 | Disposition: A | Discharge status: Home / Self Care | Payer: 59 | Attending: Emergency Medicine | Admitting: Emergency Medicine

## 2012-05-23 ENCOUNTER — Emergency Department (HOSPITAL_COMMUNITY): Payer: 59

## 2012-05-23 ENCOUNTER — Encounter (HOSPITAL_COMMUNITY): Payer: Self-pay | Admitting: Emergency Medicine

## 2012-05-23 DIAGNOSIS — I10 Essential (primary) hypertension: Secondary | ICD-10-CM | POA: Insufficient documentation

## 2012-05-23 DIAGNOSIS — R079 Chest pain, unspecified: Secondary | ICD-10-CM | POA: Insufficient documentation

## 2012-05-23 DIAGNOSIS — R51 Headache: Secondary | ICD-10-CM | POA: Insufficient documentation

## 2012-05-23 LAB — CBC WITH DIFFERENTIAL/PLATELET
Basophils Absolute: 0 10*3/uL (ref 0.0–0.1)
Basophils Relative: 0 % (ref 0–1)
Eosinophils Absolute: 0.2 10*3/uL (ref 0.0–0.7)
HCT: 39.2 % (ref 36.0–46.0)
MCH: 26.9 pg (ref 26.0–34.0)
MCHC: 32.7 g/dL (ref 30.0–36.0)
Monocytes Absolute: 0.5 10*3/uL (ref 0.1–1.0)
Neutro Abs: 4.9 10*3/uL (ref 1.7–7.7)
Neutrophils Relative %: 62 % (ref 43–77)
RDW: 13.7 % (ref 11.5–15.5)

## 2012-05-23 LAB — BASIC METABOLIC PANEL
BUN: 9 mg/dL (ref 6–23)
Chloride: 103 mEq/L (ref 96–112)
Creatinine, Ser: 0.6 mg/dL (ref 0.50–1.10)
GFR calc Af Amer: >90 mL/min (ref >90)
GFR calc non Af Amer: >90 mL/min (ref >90)

## 2012-05-23 LAB — TROPONIN I: Troponin I: <0.3 ng/mL (ref <0.30)

## 2012-05-23 MED ORDER — PROMETHAZINE HCL 25 MG/ML IJ SOLN
25.0000 mg | Freq: Once | INTRAMUSCULAR | Status: AC
  Administered 2012-05-23: 25 mg via INTRAVENOUS
  Filled 2012-05-23: qty 1

## 2012-05-23 NOTE — ED Notes (Signed)
Pt c/o intermittant central chest pain radiates under right breast, described as burning x 3 days. Pt also c/o headache with nausea, left shoulder and left hand numbness.

## 2012-05-23 NOTE — ED Provider Notes (Signed)
History     CSN: [id number]  Arrival date & time 05/23/12  1059   First MD Initiated Contact with Patient 05/23/12 1156      Chief Complaint  Patient presents with  . Chest Pain    (Consider location/radiation/quality/duration/timing/severity/associated sxs/prior treatment) Patient is a 31 y.o. female presenting with chest pain. The history is provided by the patient.  Chest Pain Primary symptoms include nausea. Pertinent negatives for primary symptoms include no shortness of breath, no abdominal pain and no vomiting.  Pertinent negatives for associated symptoms include no numbness and no weakness.    patient's been having episodes of throbbing frontal headache. These are associated with nauseousness. She states the headache will improve and then she'll develop chest pain. It is in her lower chest and goes to the lower left breast. The headaches last couple hours and the chest pain last couple hours after that. The pain is in the chest as described as burning. No cough or trouble breathing. No swelling or legs. No numbness or weakness. No cough or fevers. She she states this is unlike previous headaches that she's had. Past Medical History  Diagnosis Date  . Microcytic anemia   . Menometrorrhagia   . Uterine fibroid   . Hypertension     Past Surgical History  Procedure Date  . Abdominal hysterectomy   . Tonsillectomy   . Cesarean section   . Hip pinning B/L hips    No family history on file.  History  Substance Use Topics  . Smoking status: Current Every Day Smoker    Types: Cigarettes  . Smokeless tobacco: Not on file  . Alcohol Use: No    OB History    Grav Para Term Preterm Abortions TAB SAB Ect Mult Living                  Review of Systems  Constitutional: Negative for activity change and appetite change.  HENT: Negative for neck stiffness.   Eyes: Negative for pain.  Respiratory: Negative for chest tightness and shortness of breath.   Cardiovascular:  Positive for chest pain. Negative for leg swelling.  Gastrointestinal: Positive for nausea. Negative for vomiting, abdominal pain and diarrhea.  Genitourinary: Negative for flank pain.  Musculoskeletal: Negative for back pain.  Skin: Negative for rash.  Neurological: Positive for headaches. Negative for weakness and numbness.  Psychiatric/Behavioral: Negative for behavioral problems.    Allergies  Review of patient's allergies indicates no known allergies.  Home Medications   Current Outpatient Rx  Name Route Sig Dispense Refill  . IBUPROFEN 200 MG PO TABS Oral Take 400 mg by mouth once as needed. As needed for severe headache.      BP 169/99  Pulse 55  Temp 98.1 F (36.7 C) (Oral)  Resp 20  SpO2 96%  LMP 01/05/2011  Physical Exam  Nursing note and vitals reviewed. Constitutional: She is oriented to person, place, and time. She appears well-developed and well-nourished.       Patient is obese  HENT:  Head: Normocephalic and atraumatic.  Eyes: EOM are normal. Pupils are equal, round, and reactive to light.  Neck: Normal range of motion. Neck supple.  Cardiovascular: Normal rate, regular rhythm and normal heart sounds.   No murmur heard. Pulmonary/Chest: Effort normal and breath sounds normal. No respiratory distress. She has no wheezes. She has no rales.  Abdominal: Soft. Bowel sounds are normal. She exhibits no distension. There is no tenderness. There is no rebound and no guarding.  Musculoskeletal: Normal range of motion.  Neurological: She is alert and oriented to person, place, and time. No cranial nerve deficit.  Skin: Skin is warm and dry.  Psychiatric: She has a normal mood and affect. Her speech is normal.    ED Course  Procedures (including critical care time)  Labs Reviewed  BASIC METABOLIC PANEL - Abnormal; Notable for the following:    Glucose, Bld 157 (*)     All other components within normal limits  CBC WITH DIFFERENTIAL  TROPONIN I   Dg Chest 2  View  05/23/2012  *RADIOLOGY REPORT*  Clinical Data: Chest pain.  Headaches and back pain.  History of hypertension, numbness.  Feet and leg swelling.  CHEST - 2 VIEW  Comparison: None  Findings: Lordotic positioning.  Heart size is mildly enlarged. There are no focal consolidations or pleural effusions.  No evidence for pulmonary edema. Visualized osseous structures have a normal appearance.  IMPRESSION: Cardiomegaly without pulmonary edema.   Original Report Authenticated By: Patterson Hammersmith, M.D.      1. Headache   2. Chest pain   3. Hypertension      Date: 05/23/2012  Rate: 57  Rhythm: sinus bradycardia  QRS Axis: normal  Intervals: normal  ST/T Wave abnormalities: normal  Conduction Disutrbances:none  Narrative Interpretation:   Old EKG Reviewed: none available    MDM     a  patient presents with chest pain also headache and nauseousness. Patient feels better after treatment. She has refused head CT for her a typical headache. She feels better after treatment. He'll be discharged home. I doubt cardiac cause to the chest pain.   Juliet Rude. Rubin Payor, MD 05/23/12 [id number]

## 2012-06-14 ENCOUNTER — Emergency Department (HOSPITAL_BASED_OUTPATIENT_CLINIC_OR_DEPARTMENT_OTHER)
Admission: EM | Admit: 2012-06-14 | Discharge: 2012-06-14 | Disposition: A | Discharge status: Home / Self Care | Payer: 59 | Attending: Emergency Medicine | Admitting: Emergency Medicine

## 2012-06-14 ENCOUNTER — Encounter (HOSPITAL_BASED_OUTPATIENT_CLINIC_OR_DEPARTMENT_OTHER): Payer: Self-pay

## 2012-06-14 DIAGNOSIS — Z862 Personal history of diseases of the blood and blood-forming organs and certain disorders involving the immune mechanism: Secondary | ICD-10-CM | POA: Insufficient documentation

## 2012-06-14 DIAGNOSIS — L02419 Cutaneous abscess of limb, unspecified: Secondary | ICD-10-CM | POA: Insufficient documentation

## 2012-06-14 DIAGNOSIS — I10 Essential (primary) hypertension: Secondary | ICD-10-CM | POA: Insufficient documentation

## 2012-06-14 DIAGNOSIS — L0291 Cutaneous abscess, unspecified: Secondary | ICD-10-CM

## 2012-06-14 DIAGNOSIS — L0390 Cellulitis, unspecified: Secondary | ICD-10-CM

## 2012-06-14 DIAGNOSIS — F17200 Nicotine dependence, unspecified, uncomplicated: Secondary | ICD-10-CM | POA: Insufficient documentation

## 2012-06-14 MED ORDER — LIDOCAINE-EPINEPHRINE 2 %-1:100000 IJ SOLN
20.0000 mL | Freq: Once | INTRAMUSCULAR | Status: AC
  Administered 2012-06-14: 1 mL
  Filled 2012-06-14: qty 1

## 2012-06-14 MED ORDER — HYDROCODONE-ACETAMINOPHEN 5-325 MG PO TABS: ORAL_TABLET | ORAL | Status: DC

## 2012-06-14 MED ORDER — CEPHALEXIN 250 MG PO CAPS
500.0000 mg | ORAL_CAPSULE | Freq: Once | ORAL | Status: AC
  Administered 2012-06-14: 500 mg via ORAL
  Filled 2012-06-14: qty 2

## 2012-06-14 MED ORDER — HYDROCODONE-ACETAMINOPHEN 5-325 MG PO TABS
1.0000 | ORAL_TABLET | Freq: Once | ORAL | Status: AC
  Administered 2012-06-14: 1 via ORAL
  Filled 2012-06-14: qty 1

## 2012-06-14 MED ORDER — SULFAMETHOXAZOLE-TMP DS 800-160 MG PO TABS
1.0000 | ORAL_TABLET | Freq: Once | ORAL | Status: AC
  Administered 2012-06-14: 1 via ORAL
  Filled 2012-06-14: qty 1

## 2012-06-14 MED ORDER — SULFAMETHOXAZOLE-TRIMETHOPRIM 800-160 MG PO TABS: 1.0000 | ORAL_TABLET | Freq: Two times a day (BID) | ORAL | Status: DC

## 2012-06-14 MED ORDER — CEPHALEXIN 500 MG PO CAPS: 500.0000 mg | ORAL_CAPSULE | Freq: Four times a day (QID) | ORAL | Status: DC

## 2012-06-14 NOTE — ED Provider Notes (Signed)
History     CSN: [id number]  Arrival date & time 06/14/12  1057   First MD Initiated Contact with Patient 06/14/12 1156      Chief Complaint  Patient presents with  . Cellulitis    (Consider location/radiation/quality/duration/timing/severity/associated sxs/prior treatment) HPI Comments: Patient complains of left lower leg redness that began approximately one and half days ago. Patient noted that it worsened overnight while she was at work. She thinks that she may have been bitten by something. She denies fever, chills, nausea or vomiting. No history of diabetes. Patient describes the pain as burning. Does not radiate. Onset acute. Course is gradually worsening. Nothing makes symptoms better or worse.  The history is provided by the patient.    Past Medical History  Diagnosis Date  . Microcytic anemia   . Menometrorrhagia   . Hypertension     Past Surgical History  Procedure Date  . Abdominal hysterectomy   . Tonsillectomy   . Cesarean section   . Hip pinning B/L hips    No family history on file.  History  Substance Use Topics  . Smoking status: Current Every Day Smoker    Types: Cigarettes  . Smokeless tobacco: Not on file  . Alcohol Use: Yes    OB History    Grav Para Term Preterm Abortions TAB SAB Ect Mult Living                  Review of Systems  Constitutional: Negative for fever.  Gastrointestinal: Negative for nausea and vomiting.  Skin: Positive for color change.       Positive for abscess.  Hematological: Negative for adenopathy.    Allergies  Review of patient's allergies indicates no known allergies.  Home Medications   Current Outpatient Rx  Name  Route  Sig  Dispense  Refill  . IBUPROFEN 200 MG PO TABS   Oral   Take 400 mg by mouth once as needed. As needed for severe headache.           BP 187/95  Pulse 80  Temp 98.9 F (37.2 C) (Oral)  Resp 20  Ht 5\' 7"  (1.702 m)  Wt 295 lb (133.811 kg)  BMI 46.20 kg/m2  SpO2 100%   LMP 01/05/2011  Physical Exam  Nursing note and vitals reviewed. Constitutional: She appears well-developed and well-nourished.  HENT:  Head: Normocephalic and atraumatic.  Eyes: Conjunctivae normal are normal.  Neck: Normal range of motion. Neck supple.  Pulmonary/Chest: No respiratory distress.  Musculoskeletal: Normal range of motion. She exhibits tenderness.  Neurological: She is alert.  Skin: Skin is warm and dry.       1cm area of induration with several cm circular area of skin redness and warmth on L lateral lower leg consistent with cellulitis. Compartments of lower leg are soft.   Psychiatric: She has a normal mood and affect.    ED Course  Procedures (including critical care time)  Labs Reviewed - No data to display No results found.   1. Abscess   2. Cellulitis     12:06 PM Patient seen and examined. Medications ordered. Will I&D small indurated area.   Vital signs reviewed and are as follows: Filed Vitals:   06/14/12 1106  BP: 187/95  Pulse: 80  Temp: 98.9 F (37.2 C)  Resp: 20   INCISION AND DRAINAGE Performed by: Carolee Rota Consent: Verbal consent obtained. Risks and benefits: risks, benefits and alternatives were discussed Type: abscess  Body area: L lateral  lower leg  Anesthesia: local infiltration  Local anesthetic: lidocaine 2% with epinephrine  Anesthetic total: 3 ml  Complexity: complex Blunt dissection to break up loculations  Drainage: purulent  Drainage amount: minimal  Packing material: none  Patient tolerance: Patient tolerated the procedure well with no immediate complications.   Pt counseled on wound care.   The patient was urged to return to the Emergency Department urgently with worsening pain, swelling, expanding erythema especially if it streaks away from the affected area, fever, or if they have any other concerns.   The patient was urged to return to the Emergency Department or go to their PCP in 48 hours for  wound recheck if the area is not significantly improved.  The patient verbalized understanding and stated agreement with this plan.   Patient counseled on use of narcotic pain medications. Counseled not to combine these medications with others containing tylenol. Urged not to drink alcohol, drive, or perform any other activities that requires focus while taking these medications. The patient verbalizes understanding and agrees with the plan.  12:44 PM Patient was informed of high blood pressure reading today.  Patient was counseled about long-term health problems hypertension can cause and was urged to follow-up with a primary care doctor in the next week for a blood pressure recheck.  Patient verbalized understanding.   MDM  Patient with skin abscess amenable to incision and drainage.  Cellulitis present without systemic symptoms. Outpatient abx trial given. Will d/c to home. Do not suspect necrotizing infection. No concern for compartment syndrome.        Renne Crigler, [place] 06/14/12 1244

## 2012-06-14 NOTE — ED Notes (Signed)
C/o small area to left outer lower leg with itching-started 2 days ago-has cont'd to increase in size with redness around area

## 2012-06-14 NOTE — ED Provider Notes (Signed)
Medical screening examination/treatment/procedure(s) were performed by non-physician practitioner and as supervising physician I was immediately available for consultation/collaboration.    Amando Chaput L Keira Bohlin, MD 06/14/12 1603 

## 2012-06-16 ENCOUNTER — Encounter (HOSPITAL_COMMUNITY): Payer: Self-pay | Admitting: Emergency Medicine

## 2012-06-16 ENCOUNTER — Emergency Department (HOSPITAL_COMMUNITY)
Admission: EM | Admit: 2012-06-16 | Discharge: 2012-06-16 | Disposition: A | Discharge status: Home / Self Care | Payer: 59 | Source: Home / Self Care | Attending: Family Medicine | Admitting: Family Medicine

## 2012-06-16 DIAGNOSIS — L03119 Cellulitis of unspecified part of limb: Secondary | ICD-10-CM

## 2012-06-16 DIAGNOSIS — L02416 Cutaneous abscess of left lower limb: Secondary | ICD-10-CM

## 2012-06-16 NOTE — ED Notes (Signed)
Reports checking blood pressure today at drug store, 193/107.  Patient reports feeling bad today: headache across forehead.  Reports she was dizzy earlier, not now.  And patient is being treated with antibiotics and pain medicine since Tuesday of this week-  Seen at [facility].  Patient reports swelling increasing in left lower leg.  Reports pain is worsening. Patient reports bp being elevated on Tuesday when she was seen for leg complaint.

## 2012-06-16 NOTE — ED Notes (Signed)
Dressing placed by livia, emt.  Patient ready to be discharged

## 2012-06-16 NOTE — ED Provider Notes (Signed)
History     CSN: [id number]  Arrival date & time 06/16/12  1301   First MD Initiated Contact with Patient 06/16/12 1312      Chief Complaint  Patient presents with  . Leg Pain    (Consider location/radiation/quality/duration/timing/severity/associated sxs/prior treatment) Patient is a 31 y.o. female presenting with leg pain. The history is provided by the patient and a friend.  Leg Pain  The incident occurred more than 2 days ago (seen 11/5 with abscess, had i+d , here for recheck,). The pain is present in the left leg. The pain is mild.    Past Medical History  Diagnosis Date  . Microcytic anemia   . Menometrorrhagia   . Hypertension     Past Surgical History  Procedure Date  . Abdominal hysterectomy   . Tonsillectomy   . Cesarean section   . Hip pinning B/L hips    History reviewed. No pertinent family history.  History  Substance Use Topics  . Smoking status: Current Every Day Smoker    Types: Cigarettes  . Smokeless tobacco: Not on file  . Alcohol Use: Yes    OB History    Grav Para Term Preterm Abortions TAB SAB Ect Mult Living                  Review of Systems  Allergies  Review of patient's allergies indicates no known allergies.  Home Medications   Current Outpatient Rx  Name  Route  Sig  Dispense  Refill  . CEPHALEXIN 500 MG PO CAPS   Oral   Take 1 capsule (500 mg total) by mouth 4 (four) times daily.   28 capsule   0   . HYDROCODONE-ACETAMINOPHEN 5-325 MG PO TABS      Take 1-2 tablets every 6 hours as needed for severe pain   10 tablet   0   . LISINOPRIL-HYDROCHLOROTHIAZIDE PO   Oral   Take by mouth. Unsure of mg for this medicine, has been off this since march or April 2013         . SULFAMETHOXAZOLE-TRIMETHOPRIM 800-160 MG PO TABS   Oral   Take 1 tablet by mouth every 12 (twelve) hours.   14 tablet   0   . IBUPROFEN 200 MG PO TABS   Oral   Take 400 mg by mouth once as needed. As needed for severe headache.            BP 107/75  Pulse 64  Temp 98.2 F (36.8 C) (Oral)  Resp 20  SpO2 98%  LMP 01/05/2011  Physical Exam  Nursing note and vitals reviewed. Constitutional: She is oriented to person, place, and time. She appears well-developed and well-nourished. She appears distressed.  Musculoskeletal: She exhibits tenderness.  Neurological: She is alert and oriented to person, place, and time.  Skin: Skin is warm and dry.       Nondraining i+d site to left lat calf area, no warmth , mild diffuse sts and tenderness.    ED Course  Procedures (including critical care time)  Labs Reviewed  GLUCOSE, CAPILLARY - Abnormal; Notable for the following:    Glucose-Capillary 105 (*)     All other components within normal limits   No results found.   1. Abscess of leg, left       MDM          Linna Hoff, MD 06/16/12 [phone]

## 2012-06-16 NOTE — ED Notes (Signed)
Patient's left lower leg is swollen, increased from Tuesday per patient.  Redness around wound.  Scabbing is dark.  Patient reports she works nights and stands alot

## 2013-11-03 ENCOUNTER — Emergency Department (HOSPITAL_COMMUNITY)
Admission: EM | Admit: 2013-11-03 | Discharge: 2013-11-03 | Disposition: A | Discharge status: Home / Self Care | Payer: 59 | Attending: Emergency Medicine | Admitting: Emergency Medicine

## 2013-11-03 ENCOUNTER — Encounter (HOSPITAL_COMMUNITY): Payer: Self-pay | Admitting: Emergency Medicine

## 2013-11-03 ENCOUNTER — Emergency Department (INDEPENDENT_AMBULATORY_CARE_PROVIDER_SITE_OTHER)
Admission: EM | Admit: 2013-11-03 | Discharge: 2013-11-03 | Disposition: A | Discharge status: Nursing Home (Includes ICF) | Payer: 59 | Source: Home / Self Care

## 2013-11-03 DIAGNOSIS — M7989 Other specified soft tissue disorders: Secondary | ICD-10-CM

## 2013-11-03 DIAGNOSIS — Z8742 Personal history of other diseases of the female genital tract: Secondary | ICD-10-CM | POA: Insufficient documentation

## 2013-11-03 DIAGNOSIS — R81 Glycosuria: Secondary | ICD-10-CM

## 2013-11-03 DIAGNOSIS — I10 Essential (primary) hypertension: Secondary | ICD-10-CM

## 2013-11-03 DIAGNOSIS — R519 Headache, unspecified: Secondary | ICD-10-CM

## 2013-11-03 DIAGNOSIS — R51 Headache: Secondary | ICD-10-CM

## 2013-11-03 DIAGNOSIS — Z862 Personal history of diseases of the blood and blood-forming organs and certain disorders involving the immune mechanism: Secondary | ICD-10-CM | POA: Insufficient documentation

## 2013-11-03 DIAGNOSIS — R29898 Other symptoms and signs involving the musculoskeletal system: Secondary | ICD-10-CM

## 2013-11-03 DIAGNOSIS — Z79899 Other long term (current) drug therapy: Secondary | ICD-10-CM | POA: Insufficient documentation

## 2013-11-03 DIAGNOSIS — I160 Hypertensive urgency: Secondary | ICD-10-CM

## 2013-11-03 DIAGNOSIS — Z90710 Acquired absence of both cervix and uterus: Secondary | ICD-10-CM | POA: Insufficient documentation

## 2013-11-03 DIAGNOSIS — R002 Palpitations: Secondary | ICD-10-CM

## 2013-11-03 DIAGNOSIS — F17200 Nicotine dependence, unspecified, uncomplicated: Secondary | ICD-10-CM | POA: Insufficient documentation

## 2013-11-03 LAB — BASIC METABOLIC PANEL
BUN: 10 mg/dL (ref 6–23)
CHLORIDE: 103 meq/L (ref 96–112)
CO2: 25 meq/L (ref 19–32)
Calcium: 9.7 mg/dL (ref 8.4–10.5)
Creatinine, Ser: 0.61 mg/dL (ref 0.50–1.10)
GFR calc Af Amer: >90 mL/min (ref >90)
GFR calc non Af Amer: >90 mL/min (ref >90)
Glucose, Bld: 134 mg/dL — ABNORMAL HIGH (ref 70–99)
Potassium: 3.8 mEq/L (ref 3.7–5.3)
SODIUM: 142 meq/L (ref 137–147)

## 2013-11-03 LAB — CBC WITH DIFFERENTIAL/PLATELET
Basophils Absolute: 0 10*3/uL (ref 0.0–0.1)
Basophils Relative: 0 % (ref 0–1)
EOS PCT: 1 % (ref 0–5)
Eosinophils Absolute: 0.1 10*3/uL (ref 0.0–0.7)
HEMATOCRIT: 41.5 % (ref 36.0–46.0)
Hemoglobin: 13.8 g/dL (ref 12.0–15.0)
LYMPHS ABS: 2.4 10*3/uL (ref 0.7–4.0)
LYMPHS PCT: 29 % (ref 12–46)
MCH: 27.4 pg (ref 26.0–34.0)
MCHC: 33.3 g/dL (ref 30.0–36.0)
MCV: 82.5 fL (ref 78.0–100.0)
MONO ABS: 0.4 10*3/uL (ref 0.1–1.0)
Monocytes Relative: 5 % (ref 3–12)
Neutro Abs: 5.5 10*3/uL (ref 1.7–7.7)
Neutrophils Relative %: 65 % (ref 43–77)
Platelets: 302 10*3/uL (ref 150–400)
RBC: 5.03 MIL/uL (ref 3.87–5.11)
RDW: 13.7 % (ref 11.5–15.5)
WBC: 8.4 10*3/uL (ref 4.0–10.5)

## 2013-11-03 LAB — URINALYSIS, ROUTINE W REFLEX MICROSCOPIC
BILIRUBIN URINE: NEGATIVE
GLUCOSE, UA: 250 mg/dL — AB
Hgb urine dipstick: NEGATIVE
KETONES UR: NEGATIVE mg/dL
LEUKOCYTES UA: NEGATIVE
Nitrite: NEGATIVE
PH: 6 (ref 5.0–8.0)
Protein, ur: NEGATIVE mg/dL
Specific Gravity, Urine: 1.022 (ref 1.005–1.030)
Urobilinogen, UA: 0.2 mg/dL (ref 0.0–1.0)

## 2013-11-03 LAB — I-STAT TROPONIN, ED: Troponin i, poc: 0 ng/mL (ref 0.00–0.08)

## 2013-11-03 MED ORDER — LISINOPRIL 10 MG PO TABS: 10.0000 mg | ORAL_TABLET | Freq: Every day | ORAL | Status: DC

## 2013-11-03 MED ORDER — LISINOPRIL 10 MG PO TABS
10.0000 mg | ORAL_TABLET | Freq: Once | ORAL | Status: AC
  Administered 2013-11-03: 10 mg via ORAL
  Filled 2013-11-03: qty 1

## 2013-11-03 NOTE — ED Notes (Signed)
Pt  Reports     Symptoms     Of           Headache  /  Arm  Pain  Bilateral          With  The  Symptoms  X  4  Days         Pt  Also  Reports  Some  Swelling    Of  extremitys             Pt  Reports       that  She  Has  Not  Been  On   her  bp  meds     As  She  Has  No pcp            She  Has  An  appt  With  [facility]  In  About 1  Month

## 2013-11-03 NOTE — ED Provider Notes (Signed)
CSN: [id number]     Arrival date & time 11/03/13  1042 History   First MD Initiated Contact with Patient 11/03/13 1135     Chief Complaint  Patient presents with  . Hypertension   (Consider location/radiation/quality/duration/timing/severity/associated sxs/prior Treatment) HPI Comments: 33 year old morbidly a severely obese female with a history of uncontrolled hypertension has been experiencing a headache for 4 days as well as numbness, tingling and heavy feeling in the right and left arms. She also notes some swelling in the hands and feet. 2 days ago she experienced an unknown period time of palpitations. Denies chest pain, tightness, heaviness, pressure or fullness. Denies nausea, vomiting or urinary symptoms. She has not seen her PCP in 3 years, she has not attempted to obtain one. He has not taken any of her medications during this time. Obesity , HTN, smoking are risk factors   Past Medical History  Diagnosis Date  . Microcytic anemia   . Menometrorrhagia   . Hypertension    Past Surgical History  Procedure Laterality Date  . Abdominal hysterectomy    . Tonsillectomy    . Cesarean section    . Hip pinning  B/L hips   History reviewed. No pertinent family history. History  Substance Use Topics  . Smoking status: Current Every Day Smoker    Types: Cigarettes  . Smokeless tobacco: Not on file  . Alcohol Use: Yes   OB History   Grav Para Term Preterm Abortions TAB SAB Ect Mult Living                 Review of Systems  Constitutional: Positive for activity change. Negative for fever.  HENT: Negative.   Respiratory: Negative for cough, chest tightness, shortness of breath and wheezing.   Cardiovascular: Positive for palpitations and leg swelling. Negative for chest pain.  Genitourinary: Negative.   Musculoskeletal: Negative for arthralgias, back pain and neck pain.  Skin: Negative for rash.  Neurological: Positive for numbness. Negative for tremors, syncope and speech  difficulty.    Allergies  Review of patient's allergies indicates no known allergies.  Home Medications   Current Outpatient Rx  Name  Route  Sig  Dispense  Refill  . cephALEXin (KEFLEX) 500 MG capsule   Oral   Take 1 capsule (500 mg total) by mouth 4 (four) times daily.   28 capsule   0   . HYDROcodone-acetaminophen (NORCO/VICODIN) 5-325 MG per tablet      Take 1-2 tablets every 6 hours as needed for severe pain   10 tablet   0   . ibuprofen (ADVIL,MOTRIN) 200 MG tablet   Oral   Take 400 mg by mouth once as needed. As needed for severe headache.         Marland Kitchen LISINOPRIL-HYDROCHLOROTHIAZIDE PO   Oral   Take by mouth. Unsure of mg for this medicine, has been off this since march or April 2013         . sulfamethoxazole-trimethoprim (SEPTRA DS) 800-160 MG per tablet   Oral   Take 1 tablet by mouth every 12 (twelve) hours.   14 tablet   0    BP 212/101  Pulse 74  Temp(Src) 98.1 F (36.7 C) (Oral)  Resp 20  SpO2 96%  LMP 01/05/2011 Physical Exam  Nursing note and vitals reviewed. Constitutional: She is oriented to person, place, and time. She appears well-developed and well-nourished. No distress.  HENT:  Mouth/Throat: No oropharyngeal exudate.  Eyes: Conjunctivae and EOM are normal. Pupils are  equal, round, and reactive to light.  Neck: Normal range of motion. Neck supple. No JVD present.  No cervical bruits  Cardiovascular: Normal rate, regular rhythm and normal heart sounds.   Pulmonary/Chest: Effort normal.  Bilateral BS diminished. No wheezes.  Limited chest expansion.    Musculoskeletal: She exhibits edema.  Lymphadenopathy:    She has no cervical adenopathy.  Neurological: She is alert and oriented to person, place, and time. She exhibits normal muscle tone.  Skin: Skin is warm and dry.    ED Course  Procedures (including critical care time) Labs Review Labs Reviewed - No data to display Imaging Review No results found. EKG: NSR. No ectopy. ST  abnormality V1, V2. No ischemic changes.  MDM   1. Hypertensive urgency   2. Arm heaviness   3. Headache   4. Hand swelling   5. Heart palpitations    Transfer to [facility] for evaluation of uncontrolled HTN with arm heaviness, palpitations 2 d ago, headache for 4 d.  No PCP. Risk factors, morbid obesity, smoking, uncontrolled HTN, sedentary lifestyle.     Janne Napoleon, NP 11/03/13 1245

## 2013-11-03 NOTE — ED Notes (Addendum)
Pt from [facility] with c/o HTN. Hx of the same, but denies taking any HTN rx. Reports 6/10 HA and bilateral arm heaviness x 4 days. Denies any CP, SOB, blurred vision. Neuro intact.

## 2013-11-03 NOTE — Discharge Instructions (Signed)
Take medications as directed every morning for your high blood pressure. Follow up with your doctor as scheduled for further management. Return to the emergency department if you develop any worsening symptoms, visual changes, dizziness, chest pain, shortness of breath. Resource guide provided below for further follow up reference.    Emergency Department Resource Guide 1) Find a Doctor and Pay Out of Pocket Although you won't have to find out who is covered by your insurance plan, it is a good idea to ask around and get recommendations. You will then need to call the office and see if the doctor you have chosen will accept you as a new patient and what types of options they offer for patients who are self-pay. Some doctors offer discounts or will set up payment plans for their patients who do not have insurance, but you will need to ask so you aren't surprised when you get to your appointment.  2) Contact Your Local Health Department Not all health departments have doctors that can see patients for sick visits, but many do, so it is worth a call to see if yours does. If you don't know where your local health department is, you can check in your phone book. The CDC also has a tool to help you locate your state's health department, and many state websites also have listings of all of their local health departments.  3) Find a [facility] If your illness is not likely to be very severe or complicated, you may want to try a walk in clinic. These are popping up all over the country in pharmacies, drugstores, and shopping centers. They're usually staffed by nurse practitioners or physician assistants that have been trained to treat common illnesses and complaints. They're usually fairly quick and inexpensive. However, if you have serious medical issues or chronic medical problems, these are probably not your best option.  No Primary Care Doctor: - Call Health Connect at  [phone] - they can help you  locate a primary care doctor that  accepts your insurance, provides certain services, etc. - Physician Referral Service- [phone]  Chronic Pain Problems: Organization         Address  Phone   Notes  [facility]  [phone] Patients need to be referred by their primary care doctor.   Medication Assistance: Organization         Address  Phone   Notes  [facility] Medication [facility] [address] [place], [place] [address] [phone] --Must be a resident of [facility] -- Must have NO insurance coverage whatsoever (no Medicaid/ Medicare, etc.) -- The pt. MUST have a primary care doctor that directs their care regularly and follows them in the community   MedAssist  [phone]   [employer]  [phone]    Agencies that provide inexpensive medical care: Organization         Address  Phone   Notes  [place]  [phone]   [facility]    [phone]   [facility] [place],  [address] [phone]   [place] [address], [place] [phone]   Planned Parenthood    [phone]   [facility]    [phone]   [place] [address], [place] Phone:  [phone], Fax:  [phone] Hours of Operation:  9  am - 6 pm, M-F.  Also accepts Medicaid/Medicare and self-pay.  [facility]  [address], [place] Phone: [phone]) [phone], Fax: [phone]) [phone]. Hours of Operation:  8:30 am - 5:30 pm, M-F.  Also accepts Medicaid and self-pay.  [facility] [place] [address], [place] Phone: [phone]) [phone]   [facility] [address], [place], [place] [phone], Ext. [phone] Mondays & Thursdays: 7-9 AM.  First 15 patients are seen on a first come, first serve basis.    Medicaid-accepting [facility]  Providers:  Organization         Address  Phone   Notes  [facility] [address], [place] [phone]) [phone] Also accepts self-pay patients.  [facility] [address] [place] [address], [place]  [phone]) [phone]   [facility] [address], [place] [phone]) [phone]   [facility] [address] [place] [address], [place] [phone]) [phone]   Renaye RakersVeita Bland [address], [place]   [phone] Only accepts [place] Access [place] patients after they have their name applied to their card.   Self-Pay (no insurance) in [facility] [place] [place]:  Organization         Address  Phone   Notes  Sickle Cell Patients, [facility] [address], [place] [phone]) [phone]   [facility] [address], [place] [phone]) [phone]   [facility] [place]  [address], [place] [phone]) [phone]   [facility]/Dr. Osei-Bonsu  [address] [place] [address], [place] or [phone], [place] [phone]) [phone] Phone number for both [place] and [place] locations is the same.  [facility] [address], [place] [phone]) [phone]   [facility] [place] [address] [place] [address], [place] or [address] [phone]) [phone]   [facility] [address], [place] [phone]) [phone], phone; [phone]) [phone], fax Sees patients 1st and 3rd Saturday of every month.  Must not qualify for public or private insurance (i.e. Medicaid, Medicare, [place] Health Choice, Veterans' Benefits)  Household income should be no more than 200% of the poverty level The clinic cannot treat you if you are pregnant or think you are pregnant  Sexually transmitted diseases are not treated at the clinic.    Dental Care: Organization         Address  Phone  Notes  [facility] Department of [facility] [address], [place] [phone] Accepts children up to age 35 who are enrolled in [place] or [place] Health Choice; pregnant women with a Medicaid card; and children who have applied for Medicaid or [place] Health Choice, but were declined, whose parents can pay a reduced fee at time of service.  [facility] [place]  [address], [place] [phone]) [phone] Accepts children up to age 35 who are enrolled in [place] or [place] Health Choice; pregnant women with a Medicaid card; and children who have applied for Medicaid or [place] Health Choice, but were declined, whose parents can pay a reduced fee at time of service.  [facility]  [address], [place] [phone]) [phone] Patients are seen by appointment only. Walk-ins are not accepted. [facility] will see patients 18 years of  age and older. Monday - Tuesday (8am-5pm) Most Wednesdays (8:30-5pm) $30 per visit, cash only  [facility]  [address], [facility] [phone] Patients are seen by appointment only. Walk-ins are not accepted. [place] will see patients 67 years of age and older. One Wednesday Evening (Monthly: Volunteer Based).  $30 per visit, cash only  [place]  [phone] for adults; Children under age 56, call Graduate Pediatric Dentistry at [phone]. Children aged 3-14, please call [phone] to request a pediatric application.  Dental services are provided in all areas of dental care including fillings, crowns and bridges, complete and partial dentures, implants, gum treatment, root canals, and extractions. Preventive care is also provided. Treatment is provided to both adults and children. Patients are selected via a lottery and there is often a waiting list.   [facility] [address] [place]  [phone] [email or web address]   [facility]  [address] [place], [place] [phone], Ext. [phone] Second and Fourth Thursday of each month, opens at 6:30 AM; Clinic ends at 9 AM.  Patients are seen on a first-come first-served basis, and a limited number are seen during each clinic.   [facility]  [address]ard Danker [place], [place] [phone]   Eligibility Requirements You must have lived in [place], [place], or [place] counties for at least the last three months.   You cannot be eligible for state or federal sponsored [employer], including [employer], [place], or [employer].   You generally cannot be eligible for healthcare insurance through your employer.    How to apply: Eligibility screenings are held every Tuesday and Wednesday afternoon from 1:00 pm until 4:00 pm. You do not need an appointment for the interview!  [facility] [address] [place], [place]   [place]  [place] Department  [place]  [phone]    Behavioral Health Resources in the Community: Intensive Outpatient Programs Organization         Address  Phone  Notes  [place] [address]. [address], [place], [place] [phone]   [facility] Outpatient [address], [place], [place]   ADS: Alcohol & Drug Svcs [address] [place], [place]   [place] [address],  [place], [place] or [phone]   Substance Abuse Resources Organization         Address  Phone  Notes  Alcohol and Drug Services  [phone]   [place]  [phone]   [facility] [place]   Chinita Pester  [phone]   Residential & Outpatient Substance Abuse Program  [phone]   Psychological Services Organization         Address  Phone  Notes  [facility] [place]  [employer]  [phone]   [place] [address], [place] or [phone]    Mobile Crisis Teams Organization         Address  Phone  Notes  [facility], Mobile Crisis Care Unit  [phone]   [facility]  [address] [place], [place]   [facility] [address] [place] [phone]    Self-Help/Support Groups Organization  Address  Phone             Notes  [place]. of [place] - variety of support groups  [place] Call for more information  Narcotics Anonymous (NA), [facility] [address], [employer] [place]  2 meetings at this location   Special educational needs teacher         Address  Phone  Notes  [facility] [place],    [place]  [phone]   [facility]  [address] [place] [address]92, [place], [place]   [place], [place] [phone] Admissions: 8am-3pm M-F  Incentives Substance [place] [address]    [place], [place] [phone]   [facility] [address] [place], [place], [place]   [facility] [address],  [place], [place]   [facility] Programs - Intensive Outpatient [address], [place], [place]   [facility] [place].,  [place], [place] [phone] or [phone]   [facility] (RTS) [address], [place], [place] Accepts Medicaid  [facility] [address],  [place] [phone] Substance Abuse/Addiction Treatment   [facility] Organization         Address  Phone  Notes  [facility]  [phone]   Domenic Schwab, PhD [address] [place], [place]   [phone] or [phone]    [place], [place] [phone]   [facility] [address], [place], [place] [phone] Insurance/Medicaid/sponsorship through [facility] and Families [address], [address] [place]                                    [place], [place] [phone] [place] [address], [place] [phone]    Dr. Adele Schilder  [phone]   [facility] Dept. 1) [address] [place] 2) [place] 3)  [place] [address], [place] [phone]  [phone]   [place] [phone] or [phone] (After Hours)       Hypertension Hypertension is another name for high blood pressure. High blood pressure may mean that your heart needs to work harder to pump blood. Blood pressure consists of two numbers, which includes a higher number over a lower number (example: 110/72). HOME CARE   Make lifestyle changes as told by your doctor. This may include weight loss and exercise.  Take your blood pressure medicine every day.  Limit how much salt you use.  Stop smoking if you smoke.  Do not use drugs.  Talk to your doctor if you are using decongestants or birth control pills. These medicines might make blood pressure higher.  Females should not drink more than 1 alcoholic drink per day. Males should not drink more than 2 alcoholic drinks per day.  See your doctor as told. GET HELP RIGHT AWAY IF:   You have a blood pressure reading with a top number of 180 or higher.  You get a very bad headache.  You get blurred or changing vision.  You feel confused.  You feel  weak, numb, or faint.  You get chest or belly (abdominal) pain.  You throw up (vomit).  You cannot breathe very well. MAKE SURE YOU:   Understand these instructions.  Will watch your condition.  Will get help right away  if you are not doing well or get worse. Document Released: 01/13/2008 Document Revised: 10/19/2011 Document Reviewed: 01/13/2008 [facility] Patient Information 2014 [place], [place].

## 2013-11-04 NOTE — ED Provider Notes (Signed)
CSN: [id number]     Arrival date & time 11/03/13  1254 History   First MD Initiated Contact with Patient 11/03/13 1507     Chief Complaint  Patient presents with  . Hypertension     (Consider location/radiation/quality/duration/timing/severity/associated sxs/prior Treatment) Patient is a 33 y.o. female presenting with hypertension.  Hypertension   33 yo obese female with hx of HTN, presents with c/o HTN. Patient states she was seen at urgent care for HA and "arm heaviness"  But sent to ED due to significantly elevated BP of 212/101. Patient admits to 4 day hx of generalized HA that has now resolved. Patient also states she has been experiencing upper and lower extremity swelling with complaints of bilateral arm "pressure" sensation that she also describes as "heaviness". Patient denies visual changes, urinary sxs, N/V, Chest pain, SOB, dizziness. Patient thinks her sxs are related to her BP and is here to get started on medication. Patietn states she just got a PCP but unable to see them until April. PMH significant for Anemia, HTN, and Hysterectomy.    Patient states Past Medical History  Diagnosis Date  . Microcytic anemia   . Menometrorrhagia   . Hypertension    Past Surgical History  Procedure Laterality Date  . Abdominal hysterectomy    . Tonsillectomy    . Cesarean section    . Hip pinning  B/L hips   No family history on file. History  Substance Use Topics  . Smoking status: Current Every Day Smoker -- 0.50 packs/day    Types: Cigarettes  . Smokeless tobacco: Not on file  . Alcohol Use: Yes   OB History   Grav Para Term Preterm Abortions TAB SAB Ect Mult Living                 Review of Systems  All other systems reviewed and are negative.   Review of Systems  All other systems reviewed and are negative.     Allergies  Review of patient's allergies indicates no known allergies.  Home Medications   Current Outpatient Rx  Name  Route  Sig  Dispense   Refill  . acetaminophen (TYLENOL) 325 MG tablet   Oral   Take 650 mg by mouth every 6 (six) hours as needed for mild pain, moderate pain, fever or headache.         . lisinopril (PRINIVIL,ZESTRIL) 10 MG tablet   Oral   Take 1 tablet (10 mg total) by mouth daily.   30 tablet   0    BP 153/75  Pulse 55  Temp(Src) 98 F (36.7 C) (Oral)  Resp 17  Wt 318 lb 1 oz (144.272 kg)  SpO2 100%  LMP 01/05/2011 Physical Exam  Nursing note and vitals reviewed. Constitutional: She is oriented to person, place, and time. She appears well-developed and well-nourished. No distress.  HENT:  Head: Normocephalic and atraumatic.  Right Ear: Tympanic membrane and ear canal normal.  Left Ear: Tympanic membrane and ear canal normal.  Nose: Nose normal. Right sinus exhibits no maxillary sinus tenderness and no frontal sinus tenderness. Left sinus exhibits no maxillary sinus tenderness and no frontal sinus tenderness.  Mouth/Throat: Uvula is midline, oropharynx is clear and moist and mucous membranes are normal. No oropharyngeal exudate, posterior oropharyngeal edema or posterior oropharyngeal erythema.  Eyes: Conjunctivae and EOM are normal. Pupils are equal, round, and reactive to light. Right eye exhibits no discharge. Left eye exhibits no discharge. No scleral icterus.  Neck: Trachea normal,  normal range of motion, full passive range of motion without pain and phonation normal. Neck supple. No JVD present. No spinous process tenderness and no muscular tenderness present. Carotid bruit is not present. No rigidity. No tracheal deviation, no edema, no erythema and normal range of motion present.  No meningeal signs on exam   Cardiovascular: Normal rate and regular rhythm.  Exam reveals no gallop and no friction rub.   No murmur heard. Pulmonary/Chest: Effort normal and breath sounds normal. No stridor. No respiratory distress. She has no wheezes. She has no rhonchi. She has no rales.  Abdominal: Soft.  Bowel sounds are normal. She exhibits no distension. There is no hepatosplenomegaly. There is no tenderness. There is no rigidity, no rebound, no guarding, no tenderness at McBurney's point and negative Murphy's sign.  Musculoskeletal: Normal range of motion. She exhibits no edema.  Lymphadenopathy:    She has no cervical adenopathy.  Neurological: She is alert and oriented to person, place, and time. She has normal strength. No cranial nerve deficit or sensory deficit.  CN II-XII grossly intact.   Skin: Skin is warm and dry. No rash noted. She is not diaphoretic.  Psychiatric: She has a normal mood and affect. Her behavior is normal.    ED Course  Procedures (including critical care time) Labs Review Labs Reviewed  BASIC METABOLIC PANEL - Abnormal; Notable for the following:    Glucose, Bld 134 (*)    All other components within normal limits  URINALYSIS, ROUTINE W REFLEX MICROSCOPIC - Abnormal; Notable for the following:    Glucose, UA 250 (*)    All other components within normal limits  CBC WITH DIFFERENTIAL  I-STAT TROPOININ, ED   Imaging Review No results found.   EKG Interpretation None      MDM   Final diagnoses:  HTN (hypertension)  Glucosuria    Patient hypertensive on presentation to 212/101 Patient started on Lisinopril 10.  BP normalized throughout stay and noted to be 153/75 prior to discharge.  No evidence of end organ damage. BMP WNL  CBC WNL  Paitent noted to have mild glucosuria.  Negative troponin. No evidence of ischemia. EKG shows no ischemia.   Patient appears stable for discharge. Patient given rx for BP medication and recommend follow up with PCP as soon as possible for further management of her BP. Patient advised to return if she develops any new or worsening sxs.   Meds given in ED:  Medications  lisinopril (PRINIVIL,ZESTRIL) tablet 10 mg (10 mg Oral Given 11/03/13 1615)    Discharge Medication List as of 11/03/2013  5:09 PM    START  taking these medications   Details  lisinopril (PRINIVIL,ZESTRIL) 10 MG tablet Take 1 tablet (10 mg total) by mouth daily., Starting 11/03/2013, Until Discontinued, Print            Sherrie George, [place] 11/04/13 1740

## 2013-11-05 NOTE — ED Provider Notes (Signed)
Medical screening examination/treatment/procedure(s) were performed by non-physician practitioner and as supervising physician I was immediately available for consultation/collaboration.   EKG Interpretation   Date/Time:  Friday November 03 2013 16:04:16 EDT Ventricular Rate:  53 PR Interval:  173 QRS Duration: 89 QT Interval:  462 QTC Calculation: 434 R Axis:   49 Text Interpretation:  Sinus rhythm Borderline T wave abnormalities ED  PHYSICIAN INTERPRETATION AVAILABLE IN [facility] Confirmed by TEST,  Record ([id number]) on 11/05/2013 12:51:32 PM        Tanna Furry, MD 11/05/13 2021

## 2013-11-05 NOTE — ED Provider Notes (Signed)
Medical screening examination/treatment/procedure(s) were performed by a resident physician or non-physician practitioner and as the supervising physician I was immediately available for consultation/collaboration.  Lynne Leader, MD    Gregor Hams, MD 11/05/13 [phone]

## 2014-03-18 ENCOUNTER — Encounter (HOSPITAL_COMMUNITY): Payer: Self-pay | Admitting: Emergency Medicine

## 2014-03-18 ENCOUNTER — Emergency Department (HOSPITAL_COMMUNITY)
Admission: EM | Admit: 2014-03-18 | Discharge: 2014-03-18 | Disposition: A | Discharge status: Home / Self Care | Payer: 59 | Attending: Emergency Medicine | Admitting: Emergency Medicine

## 2014-03-18 ENCOUNTER — Emergency Department (HOSPITAL_COMMUNITY): Payer: 59

## 2014-03-18 DIAGNOSIS — R079 Chest pain, unspecified: Secondary | ICD-10-CM | POA: Insufficient documentation

## 2014-03-18 DIAGNOSIS — R51 Headache: Secondary | ICD-10-CM | POA: Insufficient documentation

## 2014-03-18 DIAGNOSIS — Z8742 Personal history of other diseases of the female genital tract: Secondary | ICD-10-CM | POA: Diagnosis not present

## 2014-03-18 DIAGNOSIS — R209 Unspecified disturbances of skin sensation: Secondary | ICD-10-CM | POA: Insufficient documentation

## 2014-03-18 DIAGNOSIS — Z862 Personal history of diseases of the blood and blood-forming organs and certain disorders involving the immune mechanism: Secondary | ICD-10-CM | POA: Diagnosis not present

## 2014-03-18 DIAGNOSIS — R0789 Other chest pain: Secondary | ICD-10-CM

## 2014-03-18 DIAGNOSIS — M542 Cervicalgia: Secondary | ICD-10-CM | POA: Diagnosis not present

## 2014-03-18 DIAGNOSIS — F17200 Nicotine dependence, unspecified, uncomplicated: Secondary | ICD-10-CM | POA: Insufficient documentation

## 2014-03-18 DIAGNOSIS — I10 Essential (primary) hypertension: Secondary | ICD-10-CM | POA: Insufficient documentation

## 2014-03-18 LAB — COMPREHENSIVE METABOLIC PANEL
ALK PHOS: 85 U/L (ref 39–117)
ALT: 15 U/L (ref 0–35)
AST: 18 U/L (ref 0–37)
Albumin: 3.8 g/dL (ref 3.5–5.2)
Anion gap: 11 (ref 5–15)
BUN: 10 mg/dL (ref 6–23)
CHLORIDE: 103 meq/L (ref 96–112)
CO2: 25 meq/L (ref 19–32)
Calcium: 9 mg/dL (ref 8.4–10.5)
Creatinine, Ser: 0.52 mg/dL (ref 0.50–1.10)
Glucose, Bld: 158 mg/dL — ABNORMAL HIGH (ref 70–99)
POTASSIUM: 4.7 meq/L (ref 3.7–5.3)
Sodium: 139 mEq/L (ref 137–147)
Total Bilirubin: <0.2 mg/dL — ABNORMAL LOW (ref 0.3–1.2)
Total Protein: 7.4 g/dL (ref 6.0–8.3)

## 2014-03-18 LAB — CBC WITH DIFFERENTIAL/PLATELET
Basophils Absolute: 0 10*3/uL (ref 0.0–0.1)
Basophils Relative: 0 % (ref 0–1)
Eosinophils Absolute: 0.1 10*3/uL (ref 0.0–0.7)
Eosinophils Relative: 2 % (ref 0–5)
HCT: 38.1 % (ref 36.0–46.0)
Hemoglobin: 12.5 g/dL (ref 12.0–15.0)
LYMPHS ABS: 1.7 10*3/uL (ref 0.7–4.0)
LYMPHS PCT: 24 % (ref 12–46)
MCH: 26.9 pg (ref 26.0–34.0)
MCHC: 32.8 g/dL (ref 30.0–36.0)
MCV: 81.9 fL (ref 78.0–100.0)
Monocytes Absolute: 0.3 10*3/uL (ref 0.1–1.0)
Monocytes Relative: 4 % (ref 3–12)
NEUTROS PCT: 70 % (ref 43–77)
Neutro Abs: 4.9 10*3/uL (ref 1.7–7.7)
PLATELETS: 302 10*3/uL (ref 150–400)
RBC: 4.65 MIL/uL (ref 3.87–5.11)
RDW: 13.3 % (ref 11.5–15.5)
WBC: 7.1 10*3/uL (ref 4.0–10.5)

## 2014-03-18 MED ORDER — IBUPROFEN 800 MG PO TABS: 800.0000 mg | ORAL_TABLET | Freq: Three times a day (TID) | ORAL | Status: DC

## 2014-03-18 MED ORDER — OXYCODONE-ACETAMINOPHEN 5-325 MG PO TABS
2.0000 | ORAL_TABLET | Freq: Once | ORAL | Status: AC
  Administered 2014-03-18: 2 via ORAL
  Filled 2014-03-18: qty 2

## 2014-03-18 MED ORDER — OXYCODONE-ACETAMINOPHEN 5-325 MG PO TABS: 2.0000 | ORAL_TABLET | ORAL | Status: DC | PRN

## 2014-03-18 MED ORDER — LISINOPRIL 20 MG PO TABS: 20.0000 mg | ORAL_TABLET | Freq: Every day | ORAL | Status: DC

## 2014-03-18 NOTE — ED Notes (Signed)
Patient transported to X-ray 

## 2014-03-18 NOTE — Discharge Instructions (Signed)
Chest Wall Pain Chest wall pain is pain in or around the bones and muscles of your chest. It may take up to 6 weeks to get better. It may take longer if you must stay physically active in your work and activities.  CAUSES  Chest wall pain may happen on its own. However, it may be caused by:  A viral illness like the flu.  Injury.  Coughing.  Exercise.  Arthritis.  Fibromyalgia.  Shingles. HOME CARE INSTRUCTIONS   Avoid overtiring physical activity. Try not to strain or perform activities that cause pain. This includes any activities using your chest or your abdominal and side muscles, especially if heavy weights are used.  Put ice on the sore area.  Put ice in a plastic bag.  Place a towel between your skin and the bag.  Leave the ice on for 15-20 minutes per hour while awake for the first 2 days.  Only take over-the-counter or prescription medicines for pain, discomfort, or fever as directed by your caregiver. SEEK IMMEDIATE MEDICAL CARE IF:   Your pain increases, or you are very uncomfortable.  You have a fever.  Your chest pain becomes worse.  You have new, unexplained symptoms.  You have nausea or vomiting.  You feel sweaty or lightheaded.  You have a cough with phlegm (sputum), or you cough up blood. MAKE SURE YOU:   Understand these instructions.  Will watch your condition.  Will get help right away if you are not doing well or get worse. Document Released: 07/27/2005 Document Revised: 10/19/2011 Document Reviewed: 03/23/2011 [facility] Patient Information 2015 [place], [place]. This information is not intended to replace advice given to you by your health care provider. Make sure you discuss any questions you have with your health care provider. Hypertension Hypertension, commonly called high blood pressure, is when the force of blood pumping through your arteries is too strong. Your arteries are the blood vessels that carry blood from your heart throughout  your body. A blood pressure reading consists of a higher number over a lower number, such as 110/72. The higher number (systolic) is the pressure inside your arteries when your heart pumps. The lower number (diastolic) is the pressure inside your arteries when your heart relaxes. Ideally you want your blood pressure below 120/80. Hypertension forces your heart to work harder to pump blood. Your arteries may become narrow or stiff. Having hypertension puts you at risk for heart disease, stroke, and other problems.  RISK FACTORS Some risk factors for high blood pressure are controllable. Others are not.  Risk factors you cannot control include:   Race. You may be at higher risk if you are African American.  Age. Risk increases with age.  Gender. Men are at higher risk than women before age 72 years. After age 68, women are at higher risk than men. Risk factors you can control include:  Not getting enough exercise or physical activity.  Being overweight.  Getting too much fat, sugar, calories, or salt in your diet.  Drinking too much alcohol. SIGNS AND SYMPTOMS Hypertension does not usually cause signs or symptoms. Extremely high blood pressure (hypertensive crisis) may cause headache, anxiety, shortness of breath, and nosebleed. DIAGNOSIS  To check if you have hypertension, your health care provider will measure your blood pressure while you are seated, with your arm held at the level of your heart. It should be measured at least twice using the same arm. Certain conditions can cause a difference in blood pressure between your  right and left arms. A blood pressure reading that is higher than normal on one occasion does not mean that you need treatment. If one blood pressure reading is high, ask your health care provider about having it checked again. TREATMENT  Treating high blood pressure includes making lifestyle changes and possibly taking medicine. Living a healthy lifestyle can help lower  high blood pressure. You may need to change some of your habits. Lifestyle changes may include:  Following the DASH diet. This diet is high in fruits, vegetables, and whole grains. It is low in salt, red meat, and added sugars.  Getting at least 2 hours of brisk physical activity every week.  Losing weight if necessary.  Not smoking.  Limiting alcoholic beverages.  Learning ways to reduce stress. If lifestyle changes are not enough to get your blood pressure under control, your health care provider may prescribe medicine. You may need to take more than one. Work closely with your health care provider to understand the risks and benefits. HOME CARE INSTRUCTIONS  Have your blood pressure rechecked as directed by your health care provider.   Take medicines only as directed by your health care provider. Follow the directions carefully. Blood pressure medicines must be taken as prescribed. The medicine does not work as well when you skip doses. Skipping doses also puts you at risk for problems.   Do not smoke.   Monitor your blood pressure at home as directed by your health care provider. SEEK MEDICAL CARE IF:   You think you are having a reaction to medicines taken.  You have recurrent headaches or feel dizzy.  You have swelling in your ankles.  You have trouble with your vision. SEEK IMMEDIATE MEDICAL CARE IF:  You develop a severe headache or confusion.  You have unusual weakness, numbness, or feel faint.  You have severe chest or abdominal pain.  You vomit repeatedly.  You have trouble breathing. MAKE SURE YOU:   Understand these instructions.  Will watch your condition.  Will get help right away if you are not doing well or get worse. Document Released: 07/27/2005 Document Revised: 12/11/2013 Document Reviewed: 05/19/2013 [facility] Patient Information 2015 [place], [place]. This information is not intended to replace advice given to you by your health care  provider. Make sure you discuss any questions you have with your health care provider.

## 2014-03-18 NOTE — ED Provider Notes (Signed)
CSN: [id number]     Arrival date & time 03/18/14  30 History   First MD Initiated Contact with Patient 03/18/14 1115     Chief Complaint  Patient presents with  . Headache  . Numbness     (Consider location/radiation/quality/duration/timing/severity/associated sxs/prior Treatment) Patient is a 33 y.o. female presenting with headaches. The history is provided by the patient. No language interpreter was used.  Headache Pain location:  Generalized Quality:  Dull Radiates to:  Does not radiate Severity currently:  7/10 Severity at highest:  7/10 Onset quality:  Gradual Duration: months. Timing:  Constant Progression:  Worsening Chronicity:  New Similar to prior headaches: no   Relieved by:  Nothing Worsened by:  Nothing tried Ineffective treatments:  None tried Associated symptoms: neck pain and numbness   Risk factors: no anger     Past Medical History  Diagnosis Date  . Microcytic anemia   . Menometrorrhagia   . Hypertension    Past Surgical History  Procedure Laterality Date  . Abdominal hysterectomy    . Tonsillectomy    . Cesarean section    . Hip pinning  B/L hips   History reviewed. No pertinent family history. History  Substance Use Topics  . Smoking status: Current Every Day Smoker -- 0.50 packs/day    Types: Cigarettes  . Smokeless tobacco: Not on file  . Alcohol Use: Yes   OB History   Grav Para Term Preterm Abortions TAB SAB Ect Mult Living                 Review of Systems  Musculoskeletal: Positive for neck pain.  Neurological: Positive for numbness and headaches.  All other systems reviewed and are negative.     Allergies  Review of patient's allergies indicates no known allergies.  Home Medications   Prior to Admission medications   Medication Sig Start Date End Date Taking? Authorizing Provider  acetaminophen (TYLENOL) 325 MG tablet Take 650 mg by mouth every 6 (six) hours as needed for mild pain, moderate pain, fever or headache.    Yes Historical Provider, MD  ibuprofen (ADVIL,MOTRIN) 200 MG tablet Take 200 mg by mouth every 6 (six) hours as needed for moderate pain.   Yes Historical Provider, MD   BP 195/111  Pulse 71  Temp(Src) 98.6 F (37 C) (Oral)  Resp 18  SpO2 96%  LMP 01/05/2011 Physical Exam  Nursing note and vitals reviewed. Constitutional: She is oriented to person, place, and time. She appears well-developed and well-nourished.  HENT:  Head: Normocephalic and atraumatic.  Right Ear: External ear normal.  Nose: Nose normal.  Mouth/Throat: Oropharynx is clear and moist.  Eyes: EOM are normal. Pupils are equal, round, and reactive to light.  Neck: Normal range of motion.  Cardiovascular: Normal rate, regular rhythm and normal heart sounds.   Pulmonary/Chest: Effort normal and breath sounds normal.  Abdominal: Soft. She exhibits no distension.  Musculoskeletal: Normal range of motion.  Neurological: She is alert and oriented to person, place, and time.  Skin: Skin is warm.  Psychiatric: She has a normal mood and affect.    ED Course  Procedures (including critical care time) Labs Review Labs Reviewed - No data to display  Imaging Review No results found.   EKG Interpretation   Date/Time:  Sunday March 18 2014 12:29:21 EDT Ventricular Rate:  56 PR Interval:  173 QRS Duration: 85 QT Interval:  454 QTC Calculation: 438 R Axis:   60 Text Interpretation:  Sinus rhythm  Borderline repolarization abnormality  Baseline wander in lead(s) V2 No significant change was found Confirmed by  CAMPOS  MD, KEVIN ([id number]) on 03/18/2014 12:34:49 PM      MDM EKg is normal,   Labs normal,   Chest xray is normal.   Pt is reproducible by palpation and by range of motion.   Pt given  Perocet for pain.   Pt is out of blood pressure medications.   She is on the [facility] practice waiting list but needs her bp medications  Pt given rx for lisinopril. Percocet and ibuprofen    Final diagnoses:  Chest wall pain     Pt feels better,  Agrees with plan    Fransico Meadow, PA-C 03/18/14 1412

## 2014-03-18 NOTE — ED Notes (Signed)
Pt reports having a headache, tingling sensation to arms and hands, swelling to hands and feet, pain to her legs. Hx of htn and has been without her meds x 2 months.

## 2014-03-18 NOTE — ED Provider Notes (Signed)
Medical screening examination/treatment/procedure(s) were performed by non-physician practitioner and as supervising physician I was immediately available for consultation/collaboration.   EKG Interpretation   Date/Time:  Sunday March 18 2014 12:29:21 EDT Ventricular Rate:  56 PR Interval:  173 QRS Duration: 85 QT Interval:  454 QTC Calculation: 438 R Axis:   60 Text Interpretation:  Sinus rhythm Borderline repolarization abnormality  Baseline wander in lead(s) V2 No significant change was found Confirmed by  Eniya Cannady  MD, Jontue Crumpacker ([id number]) on 03/18/2014 12:34:49 PM        Hoy Morn, MD 03/18/14 1428

## 2014-03-19 ENCOUNTER — Encounter (HOSPITAL_COMMUNITY): Payer: Self-pay | Admitting: Emergency Medicine

## 2014-03-19 ENCOUNTER — Emergency Department (HOSPITAL_COMMUNITY)
Admission: EM | Admit: 2014-03-19 | Discharge: 2014-03-19 | Disposition: A | Discharge status: Home / Self Care | Payer: 59 | Attending: Emergency Medicine | Admitting: Emergency Medicine

## 2014-03-19 ENCOUNTER — Emergency Department (HOSPITAL_COMMUNITY): Payer: 59

## 2014-03-19 DIAGNOSIS — Z8742 Personal history of other diseases of the female genital tract: Secondary | ICD-10-CM | POA: Insufficient documentation

## 2014-03-19 DIAGNOSIS — Z79899 Other long term (current) drug therapy: Secondary | ICD-10-CM | POA: Insufficient documentation

## 2014-03-19 DIAGNOSIS — R071 Chest pain on breathing: Secondary | ICD-10-CM | POA: Insufficient documentation

## 2014-03-19 DIAGNOSIS — I10 Essential (primary) hypertension: Secondary | ICD-10-CM | POA: Diagnosis not present

## 2014-03-19 DIAGNOSIS — Z862 Personal history of diseases of the blood and blood-forming organs and certain disorders involving the immune mechanism: Secondary | ICD-10-CM | POA: Diagnosis not present

## 2014-03-19 DIAGNOSIS — Z791 Long term (current) use of non-steroidal anti-inflammatories (NSAID): Secondary | ICD-10-CM | POA: Insufficient documentation

## 2014-03-19 DIAGNOSIS — Z90710 Acquired absence of both cervix and uterus: Secondary | ICD-10-CM | POA: Diagnosis not present

## 2014-03-19 DIAGNOSIS — Z87891 Personal history of nicotine dependence: Secondary | ICD-10-CM | POA: Diagnosis not present

## 2014-03-19 DIAGNOSIS — M5412 Radiculopathy, cervical region: Secondary | ICD-10-CM | POA: Insufficient documentation

## 2014-03-19 DIAGNOSIS — R51 Headache: Secondary | ICD-10-CM | POA: Diagnosis not present

## 2014-03-19 DIAGNOSIS — R079 Chest pain, unspecified: Secondary | ICD-10-CM | POA: Diagnosis present

## 2014-03-19 DIAGNOSIS — R519 Headache, unspecified: Secondary | ICD-10-CM

## 2014-03-19 DIAGNOSIS — R0789 Other chest pain: Secondary | ICD-10-CM

## 2014-03-19 LAB — CBC WITH DIFFERENTIAL/PLATELET
BASOS PCT: 0 % (ref 0–1)
Basophils Absolute: 0 10*3/uL (ref 0.0–0.1)
EOS ABS: 0.1 10*3/uL (ref 0.0–0.7)
Eosinophils Relative: 2 % (ref 0–5)
HCT: 38.1 % (ref 36.0–46.0)
HEMOGLOBIN: 12.2 g/dL (ref 12.0–15.0)
Lymphocytes Relative: 26 % (ref 12–46)
Lymphs Abs: 1.5 10*3/uL (ref 0.7–4.0)
MCH: 26.6 pg (ref 26.0–34.0)
MCHC: 32 g/dL (ref 30.0–36.0)
MCV: 83 fL (ref 78.0–100.0)
Monocytes Absolute: 0.3 10*3/uL (ref 0.1–1.0)
Monocytes Relative: 5 % (ref 3–12)
Neutro Abs: 4 10*3/uL (ref 1.7–7.7)
Neutrophils Relative %: 67 % (ref 43–77)
PLATELETS: 296 10*3/uL (ref 150–400)
RBC: 4.59 MIL/uL (ref 3.87–5.11)
RDW: 13.5 % (ref 11.5–15.5)
WBC: 5.9 10*3/uL (ref 4.0–10.5)

## 2014-03-19 LAB — BASIC METABOLIC PANEL
Anion gap: 13 (ref 5–15)
BUN: 10 mg/dL (ref 6–23)
CALCIUM: 9 mg/dL (ref 8.4–10.5)
CO2: 22 mEq/L (ref 19–32)
Chloride: 105 mEq/L (ref 96–112)
Creatinine, Ser: 0.58 mg/dL (ref 0.50–1.10)
GFR calc non Af Amer: >90 mL/min (ref >90)
Glucose, Bld: 178 mg/dL — ABNORMAL HIGH (ref 70–99)
POTASSIUM: 4.4 meq/L (ref 3.7–5.3)
Sodium: 140 mEq/L (ref 137–147)

## 2014-03-19 LAB — PRO B NATRIURETIC PEPTIDE: PRO B NATRI PEPTIDE: 61 pg/mL (ref 0–125)

## 2014-03-19 LAB — D-DIMER, QUANTITATIVE: D-Dimer, Quant: <0.27 ug/mL-FEU (ref 0.00–0.48)

## 2014-03-19 LAB — TROPONIN I: Troponin I: <0.3 ng/mL (ref <0.30)

## 2014-03-19 MED ORDER — HYDRALAZINE HCL 20 MG/ML IJ SOLN
10.0000 mg | Freq: Once | INTRAMUSCULAR | Status: AC
  Administered 2014-03-19: 10 mg via INTRAVENOUS
  Filled 2014-03-19: qty 1

## 2014-03-19 MED ORDER — ONDANSETRON HCL 4 MG/2ML IJ SOLN
4.0000 mg | Freq: Once | INTRAMUSCULAR | Status: AC
  Administered 2014-03-19: 4 mg via INTRAVENOUS
  Filled 2014-03-19: qty 2

## 2014-03-19 MED ORDER — DIPHENHYDRAMINE HCL 50 MG/ML IJ SOLN
25.0000 mg | Freq: Once | INTRAMUSCULAR | Status: AC
  Administered 2014-03-19: 25 mg via INTRAVENOUS
  Filled 2014-03-19: qty 1

## 2014-03-19 MED ORDER — HYDROCHLOROTHIAZIDE 25 MG PO TABS: 25.0000 mg | ORAL_TABLET | Freq: Every day | ORAL | Status: DC

## 2014-03-19 MED ORDER — PROCHLORPERAZINE EDISYLATE 5 MG/ML IJ SOLN
10.0000 mg | Freq: Four times a day (QID) | INTRAMUSCULAR | Status: DC | PRN
  Administered 2014-03-19: 10 mg via INTRAVENOUS
  Filled 2014-03-19: qty 2

## 2014-03-19 MED ORDER — MORPHINE SULFATE 4 MG/ML IJ SOLN
4.0000 mg | Freq: Once | INTRAMUSCULAR | Status: AC
Start: 2014-03-19 — End: 2014-03-19
  Administered 2014-03-19: 4 mg via INTRAVENOUS
  Filled 2014-03-19: qty 1

## 2014-03-19 MED ORDER — KETOROLAC TROMETHAMINE 30 MG/ML IJ SOLN
30.0000 mg | Freq: Once | INTRAMUSCULAR | Status: AC
  Administered 2014-03-19: 30 mg via INTRAVENOUS
  Filled 2014-03-19: qty 1

## 2014-03-19 NOTE — Discharge Instructions (Signed)
Cervical Radiculopathy Cervical radiculopathy happens when a nerve in the neck is pinched or bruised by a slipped (herniated) disk or by arthritic changes in the bones of the cervical spine. This can occur due to an injury or as part of the normal aging process. Pressure on the cervical nerves can cause pain or numbness that runs from your neck all the way down into your arm and fingers. CAUSES  There are many possible causes, including:  Injury.  Muscle tightness in the neck from overuse.  Swollen, painful joints (arthritis).  Breakdown or degeneration in the bones and joints of the spine (spondylosis) due to aging.  Bone spurs that may develop near the cervical nerves. SYMPTOMS  Symptoms include pain, weakness, or numbness in the affected arm and hand. Pain can be severe or irritating. Symptoms may be worse when extending or turning the neck. DIAGNOSIS  Your caregiver will ask about your symptoms and do a physical exam. He or she may test your strength and reflexes. X-rays, CT scans, and MRI scans may be needed in cases of injury or if the symptoms do not go away after a period of time. Electromyography (EMG) or nerve conduction testing may be done to study how your nerves and muscles are working. TREATMENT  Your caregiver may recommend certain exercises to help relieve your symptoms. Cervical radiculopathy can, and often does, get better with time and treatment. If your problems continue, treatment options may include:  Wearing a soft collar for short periods of time.  Physical therapy to strengthen the neck muscles.  Medicines, such as nonsteroidal anti-inflammatory drugs (NSAIDs), oral corticosteroids, or spinal injections.  Surgery. Different types of surgery may be done depending on the cause of your problems. HOME CARE INSTRUCTIONS   Put ice on the affected area.  Put ice in a plastic bag.  Place a towel between your skin and the bag.  Leave the ice on for 15-20 minutes,  03-04 times a day or as directed by your caregiver.  If ice does not help, you can try using heat. Take a warm shower or bath, or use a hot water bottle as directed by your caregiver.  You may try a gentle neck and shoulder massage.  Use a flat pillow when you sleep.  Only take over-the-counter or prescription medicines for pain, discomfort, or fever as directed by your caregiver.  If physical therapy was prescribed, follow your caregiver's directions.  If a soft collar was prescribed, use it as directed. SEEK IMMEDIATE MEDICAL CARE IF:   Your pain gets much worse and cannot be controlled with medicines.  You have weakness or numbness in your hand, arm, face, or leg.  You have a high fever or a stiff, rigid neck.  You lose bowel or bladder control (incontinence).  You have trouble with walking, balance, or speaking. MAKE SURE YOU:   Understand these instructions.  Will watch your condition.  Will get help right away if you are not doing well or get worse. Document Released: 04/21/2001 Document Revised: 10/19/2011 Document Reviewed: 03/10/2011 [facility] Patient Information 2015 [place], [place]. This information is not intended to replace advice given to you by your health care provider. Make sure you discuss any questions you have with your health care provider.  Chest Wall Pain Chest wall pain is pain in or around the bones and muscles of your chest. It may take up to 6 weeks to get better. It may take longer if you must stay physically active in your work  and activities.  CAUSES  Chest wall pain may happen on its own. However, it may be caused by:  A viral illness like the flu.  Injury.  Coughing.  Exercise.  Arthritis.  Fibromyalgia.  Shingles. HOME CARE INSTRUCTIONS   Avoid overtiring physical activity. Try not to strain or perform activities that cause pain. This includes any activities using your chest or your abdominal and side muscles, especially if heavy  weights are used.  Put ice on the sore area.  Put ice in a plastic bag.  Place a towel between your skin and the bag.  Leave the ice on for 15-20 minutes per hour while awake for the first 2 days.  Only take over-the-counter or prescription medicines for pain, discomfort, or fever as directed by your caregiver. SEEK IMMEDIATE MEDICAL CARE IF:   Your pain increases, or you are very uncomfortable.  You have a fever.  Your chest pain becomes worse.  You have new, unexplained symptoms.  You have nausea or vomiting.  You feel sweaty or lightheaded.  You have a cough with phlegm (sputum), or you cough up blood. MAKE SURE YOU:   Understand these instructions.  Will watch your condition.  Will get help right away if you are not doing well or get worse. Document Released: 07/27/2005 Document Revised: 10/19/2011 Document Reviewed: 03/23/2011 [facility] Patient Information 2015 [place], [place]. This information is not intended to replace advice given to you by your health care provider. Make sure you discuss any questions you have with your health care provider.  General Headache Without Cause A headache is pain or discomfort felt around the head or neck area. The specific cause of a headache may not be found. There are many causes and types of headaches. A few common ones are:  Tension headaches.  Migraine headaches.  Cluster headaches.  Chronic daily headaches. HOME CARE INSTRUCTIONS   Keep all follow-up appointments with your caregiver or any specialist referral.  Only take over-the-counter or prescription medicines for pain or discomfort as directed by your caregiver.  Lie down in a dark, quiet room when you have a headache.  Keep a headache journal to find out what may trigger your migraine headaches. For example, write down:  What you eat and drink.  How much sleep you get.  Any change to your diet or medicines.  Try massage or other relaxation  techniques.  Put ice packs or heat on the head and neck. Use these 3 to 4 times per day for 15 to 20 minutes each time, or as needed.  Limit stress.  Sit up straight, and do not tense your muscles.  Quit smoking if you smoke.  Limit alcohol use.  Decrease the amount of caffeine you drink, or stop drinking caffeine.  Eat and sleep on a regular schedule.  Get 7 to 9 hours of sleep, or as recommended by your caregiver.  Keep lights dim if bright lights bother you and make your headaches worse. SEEK MEDICAL CARE IF:   You have problems with the medicines you were prescribed.  Your medicines are not working.  You have a change from the usual headache.  You have nausea or vomiting. SEEK IMMEDIATE MEDICAL CARE IF:   Your headache becomes severe.  You have a fever.  You have a stiff neck.  You have loss of vision.  You have muscular weakness or loss of muscle control.  You start losing your balance or have trouble walking.  You feel faint or pass out.  You have severe symptoms that are different from your first symptoms. MAKE SURE YOU:   Understand these instructions.  Will watch your condition.  Will get help right away if you are not doing well or get worse. Document Released: 07/27/2005 Document Revised: 10/19/2011 Document Reviewed: 08/12/2011 [facility] Patient Information 2015 [place], [place]. This information is not intended to replace advice given to you by your health care provider. Make sure you discuss any questions you have with your health care provider.   Emergency Department Resource Guide 1) Find a Doctor and Pay Out of Pocket Although you won't have to find out who is covered by your insurance plan, it is a good idea to ask around and get recommendations. You will then need to call the office and see if the doctor you have chosen will accept you as a new patient and what types of options they offer for patients who are self-pay. Some doctors offer  discounts or will set up payment plans for their patients who do not have insurance, but you will need to ask so you aren't surprised when you get to your appointment.  2) Contact Your Local Health Department Not all health departments have doctors that can see patients for sick visits, but many do, so it is worth a call to see if yours does. If you don't know where your local health department is, you can check in your phone book. The CDC also has a tool to help you locate your state's health department, and many state websites also have listings of all of their local health departments.  3) Find a [facility] If your illness is not likely to be very severe or complicated, you may want to try a walk in clinic. These are popping up all over the country in pharmacies, drugstores, and shopping centers. They're usually staffed by nurse practitioners or physician assistants that have been trained to treat common illnesses and complaints. They're usually fairly quick and inexpensive. However, if you have serious medical issues or chronic medical problems, these are probably not your best option.  No Primary Care Doctor: - Call Health Connect at  [phone] - they can help you locate a primary care doctor that  accepts your insurance, provides certain services, etc. - Physician Referral Service- [phone]  Chronic Pain Problems: Organization         Address  Phone   Notes  [facility]  [phone] Patients need to be referred by their primary care doctor.   Medication Assistance: Organization         Address  Phone   Notes  [facility] Medication [facility] [address] [place], [place] [address] [phone] --Must be a resident of [facility] -- Must have NO insurance coverage whatsoever (no Medicaid/ Medicare, etc.) -- The pt. MUST have a primary care doctor that directs their care regularly and follows them in the community   MedAssist   [phone]   [employer]  [phone]    Agencies that provide inexpensive medical care: Organization         Address  Phone   Notes  [facility]  [phone]   [facility]    [phone]   [facility] [place], [place] [address] [phone]   [place] [address] [place] [phone]   Planned Parenthood    [phone]  [facility]    [phone]   [facility]  [address], [place] Phone:  [phone], Fax:  [phone] Hours of Operation:  9 am - 6 pm, M-F.  Also accepts Medicaid/Medicare and self-pay.  [facility] for [address] [place], [address], [place] Phone: [phone], Fax: [phone]. Hours of Operation:  8:30 am - 5:30 pm, M-F.  Also accepts Medicaid and self-pay.  [facility] [address], [place] Phone: [phone]   [place], [place], [place] [phone], Ext. [phone] Mondays & Thursdays: 7-9 AM.  First 15 patients are seen on a first come, first serve basis.    [place] Providers:  Organization         Address  Phone   Notes  [facility] [address], [place] [phone] Also accepts self-pay patients.  [facility] [address], [place]  [phone]   [place], [address], [place] [phone]   [facility] [address] [place] [phone]   Lucianne Lei [address], [place]   [phone] Only accepts [place] Access [place] patients after they have their name applied to their card.   Self-Pay (no insurance) in [facility]:  Organization         Address  Phone   Notes  Sickle Cell Patients, [facility] [place] [phone]   [facility] Rafael Capo [phone]   [facility] [place]  [place], [place], [place] [phone]   [facility]/Dr. Osei-Bonsu  [address], [place] or [address], [place] [phone] Phone number for both [place] and [place] locations is the same.  Urgent Medical and [facility] [address], [place] [phone]   [facility] [address], [place] or [address] [phone]   [facility] [address], [place] [phone], phone; [phone], fax Sees patients 1st and 3rd Saturday of every month.  Must not qualify for public or private insurance (i.e. Medicaid, Medicare, [place] Health Choice, Veterans' Benefits)  Household income should be no more than 200% of the poverty level The clinic cannot treat you if you are pregnant or think you are pregnant  Sexually transmitted diseases are not treated at the clinic.    Dental Care: Organization         Address  Phone  Notes  [facility] Department of [facility] [phone] Accepts children up to age 31 who are enrolled in [place] or [place]; pregnant women with a Medicaid card; and children who have applied for Medicaid or [place] Health Choice, but were declined, whose parents can pay a reduced fee at time of service.  [facility] Department of [facility]  [address], [place] [phone] Accepts children up to age 44 who are enrolled in [place] or [place]; pregnant women with a Medicaid card; and children who have applied for Medicaid or [place] Health Choice, but were declined, whose parents can pay a reduced fee at time of  service.  [facility]  [place]  [phone] Patients are seen by appointment only. Walk-ins are not accepted. [place] will see patients 44 years of age and older. Monday - Tuesday (8am-5pm) Most Wednesdays (8:30-5pm) $30 per visit, cash only  [facility] Adult Dental Access PROGRAM  [address], [facility] [phone] Patients are seen by appointment only. Walk-ins are not accepted. [place] will see patients 23 years of age and older. One Wednesday Evening (Monthly: Volunteer Based).  $30 per visit, cash only  [place]  [phone] for adults; Children under age 67, call Graduate Pediatric Dentistry at [phone]. Children aged 39-14, please call [phone] to request a pediatric application.  Dental services are provided in all areas of dental care including fillings, crowns and bridges, complete and partial dentures, implants, gum treatment, root canals, and extractions. Preventive care is also provided. Treatment is provided to both adults and children. Patients are selected via a lottery and there is often a waiting list.   [facility] [address] [place]  [phone] [email or web address]   [facility] [address] [place], [place] [phone], Ext. [phone] Second and Fourth Thursday of each month, opens at 6:30 AM; Clinic ends at 9 AM.  Patients are seen on a first-come first-served basis, and a limited number are seen during each clinic.   [facility]  [address] Hillard Danker [place], [place] [phone]   Eligibility Requirements You must have lived in [place], [place], or [place] counties for at least the last three months.   You cannot be eligible for state or federal sponsored [employer], including [employer], [place], or [employer].   You generally cannot be eligible for healthcare insurance through your employer.    How to apply: Eligibility screenings are held every Tuesday and Wednesday  afternoon from 1:00 pm until 4:00 pm. You do not need an appointment for the interview!  [facility] [address] [place], [place]   [place]  [place] Department  [place]  [phone]    Behavioral Health Resources in the Community: Intensive Outpatient Programs Organization         Address  Phone  Notes  Laurens McDonald. [address], [place], [place] [phone]   [facility] Outpatient [address], [place], [place]   ADS: [facility] [address], [place], [place]   [place] [address]  [place], [place] or [phone]   Substance Abuse Resources Organization         Address  Phone  Notes  Alcohol and Drug Services  [phone]   [place]  [phone]   [facility] [place]   Chinita Pester  [phone]   Residential & Outpatient Substance Abuse Program  [phone]   Psychological Services Organization         Address  Phone  Notes  [facility] [place]  [place]  [phone]   [place] [address], [place] [phone] or [phone]    Mobile Crisis Teams Organization         Address  Phone  Notes  [facility], Mobile Crisis Care Unit  [phone]   Assertive Psychotherapeutic Services  [place], [place]   [facility] [address] [place] [phone]    Self-Help/Support Groups Organization         Address  Phone             Notes  [place]. of [place] - variety of support groups  [place] Call for more information  Narcotics Anonymous (NA), [facility] [address], [employer] [place]  2 meetings at this location   Materials engineer         Address  Phone  Notes  [facility] [place],    [place]  [phone]   [facility]  [address], [place] [phone], [place], [place]   [place], [place] [phone] Admissions: 8am-3pm M-F  Incentives Substance [place] [address],    [place], [place] [phone]   [facility] [address] [place], [place], [place]   [facility] [address]  [place], [place]   [facility] - Intensive Outpatient [address] Kristeen [address], [place], [place]   [facility] ([place].,  [place], [place] [phone] or [phone]   [facility] Services (RTS) [address], [place], [place] Accepts Medicaid  [facility] [address]  [place] [phone] Substance Abuse/Addiction Treatment   [facility] Organization         Address  Phone  Notes  [facility]  [phone]   Domenic Schwab, PhD [address] Arlis Porta [place], [place]   [phone] or [phone]   [place], [place] [phone]   [facility] [address], [place], [place] [phone] Insurance/Medicaid/sponsorship through [facility] and Families [address] [place]                                    [place], [place] [phone] [place] [address], [place] [phone]    Dr. Adele Schilder  [phone]   [facility] Dept. 1) [address], [place] 2) [place] 3)  [place] [address], [place] [phone]  [phone]   [place] [phone] or [phone] (After Hours)

## 2014-03-19 NOTE — ED Notes (Signed)
EMS - Pt coming from home with ongoing left sided chest pain x 4 days.  Pt reports with the chest pain she is having radiation to the left neck and left arm.  Pt is reporting sharp pain and soreness in the left arm and tingling in the left leg.  Pt has been diaphoretic, with nausea and diarrhea, denies emesis.  Pt EKG with EMS was unremarkable.  BP 208/106, 170 CBG, 55 HR and 98% room air.

## 2014-03-19 NOTE — ED Provider Notes (Signed)
CSN: [id number]     Arrival date & time 03/19/14  2831 History   First MD Initiated Contact with Patient 03/19/14 [phone]     Chief Complaint  Patient presents with  . Chest Pain     (Consider location/radiation/quality/duration/timing/severity/associated sxs/prior Treatment) HPI Comments: Patient presents to the ER for evaluation of headache, left upper chest pain, left arm pain with numbness and tingling. Patient reports that she has had ongoing symptoms for approximately 4 days. Patient was seen in ER yesterday, worked up and discharged with chest wall pain. Patient reports that upon waking this morning she has had severe pain on the left side of her neck radiating up into the left head. Pain in his shoulder worsens with movement or palpation. She denies injury. There is no shortness of breath, cough or fever. Patient reports that her blood pressure was elevated yesterday, had been off her Prinivil for one month. She was given a prescription to restart yesterday.  Patient is a 33 y.o. female presenting with chest pain.  Chest Pain Associated symptoms: numbness     Past Medical History  Diagnosis Date  . Microcytic anemia   . Menometrorrhagia   . Hypertension    Past Surgical History  Procedure Laterality Date  . Abdominal hysterectomy    . Tonsillectomy    . Cesarean section    . Hip pinning  B/L hips   No family history on file. History  Substance Use Topics  . Smoking status: Former Smoker -- 0.50 packs/day    Types: Cigarettes  . Smokeless tobacco: Not on file  . Alcohol Use: Yes   OB History   Grav Para Term Preterm Abortions TAB SAB Ect Mult Living                 Review of Systems  Cardiovascular: Positive for chest pain.  Musculoskeletal: Positive for arthralgias and neck pain.  Neurological: Positive for numbness.  All other systems reviewed and are negative.     Allergies  Review of patient's allergies indicates no known allergies.  Home Medications    Prior to Admission medications   Medication Sig Start Date End Date Taking? Authorizing Provider  acetaminophen (TYLENOL) 325 MG tablet Take 650 mg by mouth every 6 (six) hours as needed for mild pain, moderate pain, fever or headache.   Yes Historical Provider, MD  ibuprofen (ADVIL,MOTRIN) 200 MG tablet Take 200 mg by mouth every 6 (six) hours as needed for moderate pain.   Yes Historical Provider, MD  ibuprofen (ADVIL,MOTRIN) 800 MG tablet Take 800 mg by mouth every 8 (eight) hours as needed for mild pain.   Yes Historical Provider, MD  lisinopril (PRINIVIL,ZESTRIL) 20 MG tablet Take 20 mg by mouth daily.   Yes Historical Provider, MD  oxyCODONE-acetaminophen (PERCOCET/ROXICET) 5-325 MG per tablet Take 1-2 tablets by mouth every 4 (four) hours as needed for severe pain.   Yes Historical Provider, MD  hydrochlorothiazide (HYDRODIURIL) 25 MG tablet Take 1 tablet (25 mg total) by mouth daily. 03/19/14   Orpah Greek, MD   BP 158/86  Pulse 61  Temp(Src) 98.3 F (36.8 C) (Oral)  Resp 20  SpO2 96%  LMP 01/05/2011 Physical Exam  Constitutional: She is oriented to person, place, and time. She appears well-developed and well-nourished. No distress.  HENT:  Head: Normocephalic and atraumatic.  Right Ear: Hearing normal.  Left Ear: Hearing normal.  Nose: Nose normal.  Mouth/Throat: Oropharynx is clear and moist and mucous membranes are normal.  Eyes:  Conjunctivae and EOM are normal. Pupils are equal, round, and reactive to light.  Neck: Neck supple. Muscular tenderness (severe left-sided tenderness and spasm) present. Decreased range of motion (Secondary to pain) present.    Cardiovascular: Regular rhythm, S1 normal and S2 normal.  Exam reveals no gallop and no friction rub.   No murmur heard. Pulmonary/Chest: Effort normal and breath sounds normal. No respiratory distress. She exhibits no tenderness.  Abdominal: Soft. Normal appearance and bowel sounds are normal. There is no  hepatosplenomegaly. There is no tenderness. There is no rebound, no guarding, no tenderness at McBurney's point and negative Murphy's sign. No hernia.  Musculoskeletal:       Left shoulder: She exhibits decreased range of motion (increased pain with movement, especially raising her arm) and tenderness.  Neurological: She is alert and oriented to person, place, and time. She has normal strength. No cranial nerve deficit or sensory deficit. Coordination normal. GCS eye subscore is 4. GCS verbal subscore is 5. GCS motor subscore is 6.  Skin: Skin is warm, dry and intact. No rash noted. No cyanosis.  Psychiatric: She has a normal mood and affect. Her speech is normal and behavior is normal. Thought content normal.    ED Course  Procedures (including critical care time) Labs Review Labs Reviewed  BASIC METABOLIC PANEL - Abnormal; Notable for the following:    Glucose, Bld 178 (*)    All other components within normal limits  CBC WITH DIFFERENTIAL  TROPONIN I  PRO B NATRIURETIC PEPTIDE  D-DIMER, QUANTITATIVE    Imaging Review Dg Chest 2 View  03/18/2014   CLINICAL DATA:  33 year old female with left arm and leg tingling. Headache and numbness.  EXAM: CHEST  2 VIEW  COMPARISON:  05/23/2012  FINDINGS: Cardiomegaly again noted.  There is no evidence of focal airspace disease, pulmonary edema, suspicious pulmonary nodule/mass, pleural effusion, or pneumothorax.  No acute bony abnormalities are identified.  IMPRESSION: Cardiomegaly without evidence of active cardiopulmonary disease.   Electronically Signed   By: Hassan Rowan M.D.   On: 03/18/2014 13:31   Ct Head Wo Contrast  03/19/2014   CLINICAL DATA:  Headache with elevated blood pressure  EXAM: CT HEAD WITHOUT CONTRAST  TECHNIQUE: Contiguous axial images were obtained from the base of the skull through the vertex without intravenous contrast.  COMPARISON:  None.  FINDINGS: Sinuses/Soft tissues: Partial opacification of the left maxillary sinus. Likely  due to combination of fluid and a mucous retention cyst or polyp. Other paranasal sinuses and mastoid air cells are clear.  Intracranial: No mass lesion, hemorrhage, hydrocephalus, acute infarct, intra-axial, or extra-axial fluid collection.  IMPRESSION: 1.  No acute intracranial abnormality. 2. Sinus disease.   Electronically Signed   By: Abigail Miyamoto M.D.   On: 03/19/2014 09:55     EKG Interpretation   Date/Time:  Monday March 19 2014 08:53:20 EDT Ventricular Rate:  55 PR Interval:  181 QRS Duration: 94 QT Interval:  480 QTC Calculation: 459 R Axis:   60 Text Interpretation:  Sinus rhythm Normal ECG Confirmed by Saraiah Bhat  MD,  Lisbeth Puller [phone]) on 03/19/2014 9:05:18 AM      MDM   Final diagnoses:  Headache around the eyes  Cervical radiculopathy  Chest wall pain  Essential hypertension   Patient presented to the ER for evaluation of multiple complaints. Patient has persistent chest pain today. She was awake yesterday and felt to have chest wall pain. She reports that her numbness, tingling and pain in the left  shoulder and arm have worsened. Pain is on the left side of her neck as well and the left side of her head. CT scan of head was performed to evaluate for headache as well as left arm numbness, although it still could be all secondary to cervical radiculopathy. CT scan of head was unremarkable. Patient's numbness and tingling is not felt to be secondary to a central nervous system process. Patient has exquisite tenderness over the left side of her neck, left shoulder. Pain also significantly worsens with movement of the neck and arm and this is consistent with radiculopathy. I do not suspect subarachnoid hemorrhage in this patient.  Repeat cardiac evaluation today was negative. Patient did report shortness of breath with exertion here in the ER, therefore d-dimer and BNP were added to her labs. Both were negative. Patient was markedly hypertensive here in the ER yesterday and  today. She has been restarted on her lisinopril at home, patient's blood pressure significantly improved with pain medication as well as hydralazine here in the ER. We'll add hydrochlorothiazide to her lisinopril.    Orpah Greek, MD 03/19/14 [phone]

## 2014-03-19 NOTE — ED Notes (Signed)
She denies having CP but c/o HA;nurse infomred

## 2014-03-19 NOTE — ED Notes (Signed)
Pt attempted to be walked to the bathroom when per the Tech became really dizzy.  Pt assisted back in to the bed with the assistance of the Tech and family member at bedside.  Pt did not hit her head or have complete LOC, c/o of dizziness.  Pt safely back in the bed and aided on to the bedpan to void.

## 2014-10-23 ENCOUNTER — Encounter: Payer: 59 | Attending: Family Medicine

## 2014-10-23 VITALS — Ht 67.0 in | Wt 311.4 lb

## 2014-10-23 DIAGNOSIS — E118 Type 2 diabetes mellitus with unspecified complications: Secondary | ICD-10-CM | POA: Insufficient documentation

## 2014-10-23 DIAGNOSIS — IMO0002 Reserved for concepts with insufficient information to code with codable children: Secondary | ICD-10-CM

## 2014-10-23 DIAGNOSIS — Z713 Dietary counseling and surveillance: Secondary | ICD-10-CM | POA: Diagnosis not present

## 2014-10-23 DIAGNOSIS — E1165 Type 2 diabetes mellitus with hyperglycemia: Secondary | ICD-10-CM

## 2014-10-24 NOTE — Progress Notes (Signed)
Patient was seen on 10/23/14 for the first of a series of three diabetes self-management courses at the [facility].  Patient Education Plan per assessed needs and concerns is to attend four course education program for Diabetes Self Management Education.  The following learning objectives were met by the patient during this class:  Describe diabetes  State some common risk factors for diabetes  Defines the role of glucose and insulin  Identifies type of diabetes and pathophysiology  Describe the relationship between diabetes and cardiovascular risk  State the members of the Healthcare Team  States the rationale for glucose monitoring  State when to test glucose  State their individual Target Range  State the importance of logging glucose readings  Describe how to interpret glucose readings  Identifies A1C target  Explain the correlation between A1c and eAG values  State symptoms and treatment of high blood glucose  State symptoms and treatment of low blood glucose  Explain proper technique for glucose testing  Identifies proper sharps disposal  Handouts given during class include:  Living Well with Diabetes book  Carb Counting and Meal Planning book  Meal Plan Card  Carbohydrate guide  Meal planning worksheet  Low Sodium Flavoring Tips  The diabetes portion plate  K1P to eAG Conversion Chart  Diabetes Medications  Diabetes Recommended Care Schedule  Support Group  Diabetes Success Plan  Core Class Satisfaction Survey  Follow-Up Plan:  Attend core 2

## 2014-10-30 DIAGNOSIS — E118 Type 2 diabetes mellitus with unspecified complications: Principal | ICD-10-CM

## 2014-10-30 DIAGNOSIS — IMO0002 Reserved for concepts with insufficient information to code with codable children: Secondary | ICD-10-CM

## 2014-10-30 DIAGNOSIS — E1165 Type 2 diabetes mellitus with hyperglycemia: Secondary | ICD-10-CM

## 2014-10-30 NOTE — Progress Notes (Signed)

## 2014-11-06 ENCOUNTER — Ambulatory Visit: Payer: Self-pay

## 2014-11-19 ENCOUNTER — Emergency Department (HOSPITAL_COMMUNITY)
Admission: EM | Admit: 2014-11-19 | Discharge: 2014-11-19 | Disposition: A | Discharge status: Home / Self Care | Payer: 59 | Source: Home / Self Care | Attending: Emergency Medicine | Admitting: Emergency Medicine

## 2014-11-19 ENCOUNTER — Encounter (HOSPITAL_COMMUNITY): Payer: Self-pay

## 2014-11-19 DIAGNOSIS — Z79899 Other long term (current) drug therapy: Secondary | ICD-10-CM

## 2014-11-19 DIAGNOSIS — R112 Nausea with vomiting, unspecified: Secondary | ICD-10-CM

## 2014-11-19 DIAGNOSIS — Z862 Personal history of diseases of the blood and blood-forming organs and certain disorders involving the immune mechanism: Secondary | ICD-10-CM | POA: Insufficient documentation

## 2014-11-19 DIAGNOSIS — Z6841 Body Mass Index (BMI) 40.0 and over, adult: Secondary | ICD-10-CM

## 2014-11-19 DIAGNOSIS — Z90710 Acquired absence of both cervix and uterus: Secondary | ICD-10-CM

## 2014-11-19 DIAGNOSIS — Z87891 Personal history of nicotine dependence: Secondary | ICD-10-CM

## 2014-11-19 DIAGNOSIS — Z8742 Personal history of other diseases of the female genital tract: Secondary | ICD-10-CM | POA: Insufficient documentation

## 2014-11-19 DIAGNOSIS — K859 Acute pancreatitis, unspecified: Principal | ICD-10-CM | POA: Diagnosis present

## 2014-11-19 DIAGNOSIS — R197 Diarrhea, unspecified: Secondary | ICD-10-CM

## 2014-11-19 DIAGNOSIS — Z791 Long term (current) use of non-steroidal anti-inflammatories (NSAID): Secondary | ICD-10-CM

## 2014-11-19 DIAGNOSIS — E119 Type 2 diabetes mellitus without complications: Secondary | ICD-10-CM | POA: Insufficient documentation

## 2014-11-19 DIAGNOSIS — D509 Iron deficiency anemia, unspecified: Secondary | ICD-10-CM | POA: Diagnosis present

## 2014-11-19 DIAGNOSIS — E1165 Type 2 diabetes mellitus with hyperglycemia: Secondary | ICD-10-CM | POA: Diagnosis present

## 2014-11-19 DIAGNOSIS — A5901 Trichomonal vulvovaginitis: Secondary | ICD-10-CM | POA: Diagnosis present

## 2014-11-19 DIAGNOSIS — E860 Dehydration: Secondary | ICD-10-CM | POA: Diagnosis present

## 2014-11-19 DIAGNOSIS — E876 Hypokalemia: Secondary | ICD-10-CM | POA: Diagnosis present

## 2014-11-19 DIAGNOSIS — I10 Essential (primary) hypertension: Secondary | ICD-10-CM

## 2014-11-19 DIAGNOSIS — K7581 Nonalcoholic steatohepatitis (NASH): Secondary | ICD-10-CM | POA: Diagnosis present

## 2014-11-19 DIAGNOSIS — E669 Obesity, unspecified: Secondary | ICD-10-CM

## 2014-11-19 DIAGNOSIS — R1011 Right upper quadrant pain: Secondary | ICD-10-CM | POA: Diagnosis not present

## 2014-11-19 LAB — URINE MICROSCOPIC-ADD ON

## 2014-11-19 LAB — CBC WITH DIFFERENTIAL/PLATELET
BASOS ABS: 0 10*3/uL (ref 0.0–0.1)
Basophils Relative: 0 % (ref 0–1)
EOS ABS: 0.1 10*3/uL (ref 0.0–0.7)
EOS PCT: 1 % (ref 0–5)
HCT: 38.4 % (ref 36.0–46.0)
Hemoglobin: 12.5 g/dL (ref 12.0–15.0)
Lymphocytes Relative: 22 % (ref 12–46)
Lymphs Abs: 1.9 10*3/uL (ref 0.7–4.0)
MCH: 26.9 pg (ref 26.0–34.0)
MCHC: 32.6 g/dL (ref 30.0–36.0)
MCV: 82.8 fL (ref 78.0–100.0)
Monocytes Absolute: 0.3 10*3/uL (ref 0.1–1.0)
Monocytes Relative: 4 % (ref 3–12)
Neutro Abs: 6 10*3/uL (ref 1.7–7.7)
Neutrophils Relative %: 73 % (ref 43–77)
Platelets: 321 10*3/uL (ref 150–400)
RBC: 4.64 MIL/uL (ref 3.87–5.11)
RDW: 13.9 % (ref 11.5–15.5)
WBC: 8.3 10*3/uL (ref 4.0–10.5)

## 2014-11-19 LAB — COMPREHENSIVE METABOLIC PANEL
ALT: 28 U/L (ref 0–35)
AST: 17 U/L (ref 0–37)
Albumin: 4.1 g/dL (ref 3.5–5.2)
Alkaline Phosphatase: 71 U/L (ref 39–117)
Anion gap: 12 (ref 5–15)
BUN: 9 mg/dL (ref 6–23)
CO2: 23 mmol/L (ref 19–32)
CREATININE: 0.79 mg/dL (ref 0.50–1.10)
Calcium: 9.5 mg/dL (ref 8.4–10.5)
Chloride: 101 mmol/L (ref 96–112)
Glucose, Bld: 182 mg/dL — ABNORMAL HIGH (ref 70–99)
Potassium: 3.6 mmol/L (ref 3.5–5.1)
Sodium: 136 mmol/L (ref 135–145)
TOTAL PROTEIN: 7.5 g/dL (ref 6.0–8.3)
Total Bilirubin: 0.5 mg/dL (ref 0.3–1.2)

## 2014-11-19 LAB — URINALYSIS, ROUTINE W REFLEX MICROSCOPIC
Bilirubin Urine: NEGATIVE
Glucose, UA: NEGATIVE mg/dL
Hgb urine dipstick: NEGATIVE
Ketones, ur: NEGATIVE mg/dL
Nitrite: NEGATIVE
Protein, ur: NEGATIVE mg/dL
Specific Gravity, Urine: 1.013 (ref 1.005–1.030)
Urobilinogen, UA: 0.2 mg/dL (ref 0.0–1.0)
pH: 5 (ref 5.0–8.0)

## 2014-11-19 MED ORDER — ONDANSETRON HCL 4 MG/2ML IJ SOLN
4.0000 mg | Freq: Once | INTRAMUSCULAR | Status: AC
  Administered 2014-11-19: 4 mg via INTRAVENOUS
  Filled 2014-11-19: qty 2

## 2014-11-19 MED ORDER — CIPROFLOXACIN HCL 500 MG PO TABS
500.0000 mg | ORAL_TABLET | Freq: Once | ORAL | Status: AC
  Administered 2014-11-19: 500 mg via ORAL
  Filled 2014-11-19: qty 1

## 2014-11-19 MED ORDER — LOPERAMIDE HCL 2 MG PO CAPS
4.0000 mg | ORAL_CAPSULE | Freq: Once | ORAL | Status: AC
  Administered 2014-11-19: 4 mg via ORAL
  Filled 2014-11-19: qty 2

## 2014-11-19 MED ORDER — METRONIDAZOLE 500 MG PO TABS
500.0000 mg | ORAL_TABLET | Freq: Three times a day (TID) | ORAL | Status: DC
Start: 2014-11-19 — End: 2014-11-27

## 2014-11-19 MED ORDER — METRONIDAZOLE 500 MG PO TABS
500.0000 mg | ORAL_TABLET | Freq: Once | ORAL | Status: AC
  Administered 2014-11-19: 500 mg via ORAL
  Filled 2014-11-19: qty 1

## 2014-11-19 MED ORDER — ONDANSETRON HCL 4 MG PO TABS: 4.0000 mg | ORAL_TABLET | Freq: Four times a day (QID) | ORAL | Status: DC

## 2014-11-19 MED ORDER — CIPROFLOXACIN HCL 500 MG PO TABS: 500.0000 mg | ORAL_TABLET | Freq: Two times a day (BID) | ORAL | Status: DC

## 2014-11-19 MED ORDER — HYDROMORPHONE HCL 1 MG/ML IJ SOLN
1.0000 mg | Freq: Once | INTRAMUSCULAR | Status: AC
  Administered 2014-11-19: 1 mg via INTRAVENOUS
  Filled 2014-11-19: qty 1

## 2014-11-19 MED ORDER — PROMETHAZINE HCL 25 MG/ML IJ SOLN
25.0000 mg | Freq: Once | INTRAMUSCULAR | Status: AC
  Administered 2014-11-19: 25 mg via INTRAMUSCULAR
  Filled 2014-11-19: qty 1

## 2014-11-19 MED ORDER — TRAMADOL HCL 50 MG PO TABS: 50.0000 mg | ORAL_TABLET | Freq: Four times a day (QID) | ORAL | Status: DC | PRN

## 2014-11-19 MED ORDER — SODIUM CHLORIDE 0.9 % IV BOLUS (SEPSIS)
1000.0000 mL | Freq: Once | INTRAVENOUS | Status: AC
  Administered 2014-11-19: 1000 mL via INTRAVENOUS

## 2014-11-19 NOTE — Discharge Instructions (Signed)
Diarrhea °Diarrhea is watery poop (stool). It can make you feel weak, tired, thirsty, or give you a dry mouth (signs of dehydration). Watery poop is a sign of another problem, most often an infection. It often lasts 2-3 days. It can last longer if it is a sign of something serious. Take care of yourself as told by your doctor. °HOME CARE  °· Drink 1 cup (8 ounces) of fluid each time you have watery poop. °· Do not drink the following fluids: °¨ Those that contain simple sugars (fructose, glucose, galactose, lactose, sucrose, maltose). °¨ Sports drinks. °¨ Fruit juices. °¨ Whole milk products. °¨ Sodas. °¨ Drinks with caffeine (coffee, tea, soda) or alcohol. °· Oral rehydration solution may be used if the doctor says it is okay. You may make your own solution. Follow this recipe: °¨  - teaspoon table salt. °¨ ¾ teaspoon baking soda. °¨  teaspoon salt substitute containing potassium chloride. °¨ 1 tablespoons sugar. °¨ 1 liter (34 ounces) of water. °· Avoid the following foods: °¨ High fiber foods, such as raw fruits and vegetables. °¨ Nuts, seeds, and whole grain breads and cereals. °¨  Those that are sweetened with sugar alcohols (xylitol, sorbitol, mannitol). °· Try eating the following foods: °¨ Starchy foods, such as rice, toast, pasta, low-sugar cereal, oatmeal, baked potatoes, crackers, and bagels. °¨ Bananas. °¨ Applesauce. °· Eat probiotic-rich foods, such as yogurt and milk products that are fermented. °· Wash your hands well after each time you have watery poop. °· Only take medicine as told by your doctor. °· Take a warm bath to help lessen burning or pain from having watery poop. °GET HELP RIGHT AWAY IF:  °· You cannot drink fluids without throwing up (vomiting). °· You keep throwing up. °· You have blood in your poop, or your poop looks black and tarry. °· You do not pee (urinate) in 6-8 hours, or there is only a small amount of very dark pee. °· You have belly (abdominal) pain that gets worse or stays  in the same spot (localizes). °· You are weak, dizzy, confused, or light-headed. °· You have a very bad headache. °· Your watery poop gets worse or does not get better. °· You have a fever or lasting symptoms for more than 2-3 days. °· You have a fever and your symptoms suddenly get worse. °MAKE SURE YOU:  °· Understand these instructions. °· Will watch your condition. °· Will get help right away if you are not doing well or get worse. °Document Released: 01/13/2008 Document Revised: 12/11/2013 Document Reviewed: 04/03/2012 °Exit[employer]® Patient Information ©2015 [employer]. This information is not intended to replace advice given to you by your health care provider. Make sure you discuss any questions you have with your health care provider. ° °Nausea and Vomiting °Nausea means you feel sick to your stomach. Throwing up (vomiting) is a reflex where stomach contents come out of your mouth. °HOME CARE  °· Take medicine as told by your doctor. °· Do not force yourself to eat. However, you do need to drink fluids. °· If you feel like eating, eat a normal diet as told by your doctor. °¨ Eat rice, wheat, potatoes, bread, lean meats, yogurt, fruits, and vegetables. °¨ Avoid high-fat foods. °· Drink enough fluids to keep your pee (urine) clear or pale yellow. °· Ask your doctor how to replace body fluid losses (rehydrate). Signs of body fluid loss (dehydration) include: °¨ Feeling very thirsty. °¨ Dry lips and mouth. °¨ Feeling dizzy. °¨   Dark pee. °¨ Peeing less than normal. °¨ Feeling confused. °¨ Fast breathing or heart rate. °GET HELP RIGHT AWAY IF:  °· You have blood in your throw up. °· You have black or bloody poop (stool). °· You have a bad headache or stiff neck. °· You feel confused. °· You have bad belly (abdominal) pain. °· You have chest pain or trouble breathing. °· You do not pee at least once every 8 hours. °· You have cold, clammy skin. °· You keep throwing up after 24 to 48 hours. °· You have a  fever. °MAKE SURE YOU:  °· Understand these instructions. °· Will watch your condition. °· Will get help right away if you are not doing well or get worse. °Document Released: 01/13/2008 Document Revised: 10/19/2011 Document Reviewed: 12/26/2010 °ExitCare® Patient Information ©2015 [employer]. This information is not intended to replace advice given to you by your health care provider. Make sure you discuss any questions you have with your health care provider. ° °

## 2014-11-19 NOTE — ED Provider Notes (Signed)
CSN: [id number]     Arrival date & time 11/19/14  0749 History   First MD Initiated Contact with Patient 11/19/14 0751     Chief Complaint  Patient presents with  . Abdominal Pain     (Consider location/radiation/quality/duration/timing/severity/associated sxs/prior Treatment) HPI   34 year old female with nausea, vomiting and diarrhea. Symptom onset almost a week ago. Persistent since then. Vomiting and diarrhea every day since then. No fevers or chills. No sick contacts. No recent antibiotic usage. No blood in stool or melena. Has noticed small streaks of blood in her emesis within the past 48 hours. No blood thinners. No recent medication changes aside from recently been started on victoza for her diabetes.  Past Medical History  Diagnosis Date  . Microcytic anemia   . Menometrorrhagia   . Hypertension   . Diabetes mellitus without complication    Past Surgical History  Procedure Laterality Date  . Abdominal hysterectomy    . Tonsillectomy    . Cesarean section    . Hip pinning  B/L hips   History reviewed. No pertinent family history. History  Substance Use Topics  . Smoking status: Former Smoker -- 0.50 packs/day    Types: Cigarettes  . Smokeless tobacco: Not on file  . Alcohol Use: Yes   OB History    No data available     Review of Systems  All systems reviewed and negative, other than as noted in HPI.   Allergies  Review of patient's allergies indicates no known allergies.  Home Medications   Prior to Admission medications   Medication Sig Start Date End Date Taking? Authorizing Provider  acetaminophen (TYLENOL) 325 MG tablet Take 650 mg by mouth every 6 (six) hours as needed for mild pain, moderate pain, fever or headache.    Historical Provider, MD  glimepiride (AMARYL) 2 MG tablet Take 2 mg by mouth daily with breakfast.    Historical Provider, MD  hydrochlorothiazide (HYDRODIURIL) 25 MG tablet Take 1 tablet (25 mg total) by mouth daily. 03/19/14    Orpah Greek, MD  ibuprofen (ADVIL,MOTRIN) 200 MG tablet Take 200 mg by mouth every 6 (six) hours as needed for moderate pain.    Historical Provider, MD  ibuprofen (ADVIL,MOTRIN) 800 MG tablet Take 800 mg by mouth every 8 (eight) hours as needed for mild pain.    Historical Provider, MD  lisinopril (PRINIVIL,ZESTRIL) 20 MG tablet Take 20 mg by mouth daily.    Historical Provider, MD  metFORMIN (GLUCOPHAGE) 500 MG tablet Take 500 mg by mouth 2 (two) times daily with a meal.    Historical Provider, MD  oxyCODONE-acetaminophen (PERCOCET/ROXICET) 5-325 MG per tablet Take 1-2 tablets by mouth every 4 (four) hours as needed for severe pain.    Historical Provider, MD   BP 160/106 mmHg  Pulse 70  Temp(Src) 98.2 F (36.8 C) (Oral)  Resp 18  Ht 5\' 7"  (1.702 m)  Wt 282 lb (127.914 kg)  BMI 44.16 kg/m2  SpO2 96%  LMP 01/05/2011 Physical Exam  Constitutional: She appears well-developed and well-nourished. No distress.  Sitting in bed. No acute distress. Obese.  HENT:  Head: Normocephalic and atraumatic.  Eyes: Conjunctivae are normal. Right eye exhibits no discharge. Left eye exhibits no discharge.  Neck: Neck supple.  Cardiovascular: Normal rate, regular rhythm and normal heart sounds.  Exam reveals no gallop and no friction rub.   No murmur heard. Pulmonary/Chest: Effort normal and breath sounds normal. No respiratory distress.  Abdominal: Soft. She exhibits no  distension. There is no tenderness.  Musculoskeletal: She exhibits no edema or tenderness.  Neurological: She is alert.  Skin: Skin is warm and dry. She is not diaphoretic.  Psychiatric: She has a normal mood and affect. Her behavior is normal. Thought content normal.  Nursing note and vitals reviewed.   ED Course  Procedures (including critical care time) Labs Review Labs Reviewed  COMPREHENSIVE METABOLIC PANEL - Abnormal; Notable for the following:    Glucose, Bld 182 (*)    All other components within normal limits   URINALYSIS, ROUTINE W REFLEX MICROSCOPIC - Abnormal; Notable for the following:    APPearance HAZY (*)    Leukocytes, UA SMALL (*)    All other components within normal limits  URINE MICROSCOPIC-ADD ON - Abnormal; Notable for the following:    Squamous Epithelial / LPF MANY (*)    Bacteria, UA FEW (*)    All other components within normal limits  CBC WITH DIFFERENTIAL/PLATELET    Imaging Review No results found.   EKG Interpretation None      MDM   Final diagnoses:  Nausea vomiting and diarrhea    34 year old female with nausea, vomiting and diarrhea. He be viral illness. Benign abdominal examination. We'll check basic labs given the duration of her symptoms. IV fluids. Symptomatically treatment. Low suspicion for acute surgical process or other serious issue at this time.    Virgel Manifold, MD 11/23/14 [phone]

## 2014-11-19 NOTE — ED Notes (Signed)
MD made aware of pt increase in pain

## 2014-11-19 NOTE — ED Notes (Signed)
RN went to d/c pt and she began vomiting.  MD made aware and further orders received.

## 2014-11-19 NOTE — ED Notes (Signed)
Pt reports abdominal pain with n/v/d x1 week.  Pt reports she was recently put on Victoza and assumed her symptoms were side effects from the medications but reports "I had a rough night last night and  I'm just so weak.  I throw up and have diarrhea every day, multiple times a day."

## 2014-11-21 ENCOUNTER — Emergency Department (HOSPITAL_COMMUNITY)
Admission: EM | Admit: 2014-11-21 | Discharge: 2014-11-21 | Discharge status: Against Medical Advice | Payer: 59 | Source: Home / Self Care

## 2014-11-21 ENCOUNTER — Encounter (HOSPITAL_COMMUNITY): Payer: Self-pay | Admitting: Physical Medicine and Rehabilitation

## 2014-11-21 DIAGNOSIS — R112 Nausea with vomiting, unspecified: Secondary | ICD-10-CM

## 2014-11-21 DIAGNOSIS — R109 Unspecified abdominal pain: Secondary | ICD-10-CM | POA: Insufficient documentation

## 2014-11-21 DIAGNOSIS — E119 Type 2 diabetes mellitus without complications: Secondary | ICD-10-CM | POA: Insufficient documentation

## 2014-11-21 DIAGNOSIS — R197 Diarrhea, unspecified: Secondary | ICD-10-CM | POA: Insufficient documentation

## 2014-11-21 LAB — CBC WITH DIFFERENTIAL/PLATELET
BASOS ABS: 0 10*3/uL (ref 0.0–0.1)
Basophils Relative: 0 % (ref 0–1)
Eosinophils Absolute: 0.1 10*3/uL (ref 0.0–0.7)
Eosinophils Relative: 1 % (ref 0–5)
HCT: 38.7 % (ref 36.0–46.0)
HEMOGLOBIN: 12.7 g/dL (ref 12.0–15.0)
LYMPHS PCT: 13 % (ref 12–46)
Lymphs Abs: 1.1 10*3/uL (ref 0.7–4.0)
MCH: 27.3 pg (ref 26.0–34.0)
MCHC: 32.8 g/dL (ref 30.0–36.0)
MCV: 83.2 fL (ref 78.0–100.0)
Monocytes Absolute: 0.4 10*3/uL (ref 0.1–1.0)
Monocytes Relative: 5 % (ref 3–12)
NEUTROS ABS: 6.8 10*3/uL (ref 1.7–7.7)
Neutrophils Relative %: 81 % — ABNORMAL HIGH (ref 43–77)
PLATELETS: 321 10*3/uL (ref 150–400)
RBC: 4.65 MIL/uL (ref 3.87–5.11)
RDW: 14 % (ref 11.5–15.5)
WBC: 8.4 10*3/uL (ref 4.0–10.5)

## 2014-11-21 LAB — COMPREHENSIVE METABOLIC PANEL
ALT: 782 U/L — ABNORMAL HIGH (ref 0–35)
AST: 952 U/L — AB (ref 0–37)
Albumin: 3.9 g/dL (ref 3.5–5.2)
Alkaline Phosphatase: 151 U/L — ABNORMAL HIGH (ref 39–117)
Anion gap: 11 (ref 5–15)
BUN: 6 mg/dL (ref 6–23)
CALCIUM: 9.7 mg/dL (ref 8.4–10.5)
CO2: 25 mmol/L (ref 19–32)
Chloride: 99 mmol/L (ref 96–112)
Creatinine, Ser: 0.77 mg/dL (ref 0.50–1.10)
Glucose, Bld: 172 mg/dL — ABNORMAL HIGH (ref 70–99)
Potassium: 3.6 mmol/L (ref 3.5–5.1)
SODIUM: 135 mmol/L (ref 135–145)
Total Bilirubin: 2.2 mg/dL — ABNORMAL HIGH (ref 0.3–1.2)
Total Protein: 7.7 g/dL (ref 6.0–8.3)

## 2014-11-21 LAB — LIPASE, BLOOD: LIPASE: 94 U/L — AB (ref 11–59)

## 2014-11-21 NOTE — ED Notes (Signed)
Pt presents to department for evaluation of L sided abdominal pain, nausea, vomiting and diarrhea. Ongoing x1 week, was seen here on Monday for same. 8/10 L sided abdominal pain upon arrival to ED.

## 2014-11-22 ENCOUNTER — Encounter (HOSPITAL_COMMUNITY): Payer: Self-pay | Admitting: Emergency Medicine

## 2014-11-22 ENCOUNTER — Inpatient Hospital Stay (HOSPITAL_COMMUNITY)
Admission: EM | Admit: 2014-11-22 | Discharge: 2014-11-27 | DRG: 439 | Disposition: A | Discharge status: Home / Self Care | Payer: 59 | Attending: Internal Medicine | Admitting: Internal Medicine

## 2014-11-22 ENCOUNTER — Emergency Department (HOSPITAL_COMMUNITY): Payer: 59

## 2014-11-22 DIAGNOSIS — K858 Other acute pancreatitis without necrosis or infection: Secondary | ICD-10-CM

## 2014-11-22 DIAGNOSIS — K7581 Nonalcoholic steatohepatitis (NASH): Secondary | ICD-10-CM | POA: Diagnosis present

## 2014-11-22 DIAGNOSIS — R112 Nausea with vomiting, unspecified: Secondary | ICD-10-CM | POA: Diagnosis not present

## 2014-11-22 DIAGNOSIS — A5901 Trichomonal vulvovaginitis: Secondary | ICD-10-CM | POA: Diagnosis present

## 2014-11-22 DIAGNOSIS — E1165 Type 2 diabetes mellitus with hyperglycemia: Secondary | ICD-10-CM | POA: Diagnosis not present

## 2014-11-22 DIAGNOSIS — Z87891 Personal history of nicotine dependence: Secondary | ICD-10-CM | POA: Diagnosis not present

## 2014-11-22 DIAGNOSIS — R740 Nonspecific elevation of levels of transaminase and lactic acid dehydrogenase [LDH]: Secondary | ICD-10-CM

## 2014-11-22 DIAGNOSIS — R1011 Right upper quadrant pain: Secondary | ICD-10-CM

## 2014-11-22 DIAGNOSIS — E860 Dehydration: Secondary | ICD-10-CM | POA: Diagnosis not present

## 2014-11-22 DIAGNOSIS — E669 Obesity, unspecified: Secondary | ICD-10-CM | POA: Diagnosis present

## 2014-11-22 DIAGNOSIS — R1032 Left lower quadrant pain: Secondary | ICD-10-CM

## 2014-11-22 DIAGNOSIS — Z794 Long term (current) use of insulin: Secondary | ICD-10-CM

## 2014-11-22 DIAGNOSIS — Z6841 Body Mass Index (BMI) 40.0 and over, adult: Secondary | ICD-10-CM | POA: Diagnosis not present

## 2014-11-22 DIAGNOSIS — IMO0002 Reserved for concepts with insufficient information to code with codable children: Secondary | ICD-10-CM

## 2014-11-22 DIAGNOSIS — I152 Hypertension secondary to endocrine disorders: Secondary | ICD-10-CM

## 2014-11-22 DIAGNOSIS — K859 Acute pancreatitis without necrosis or infection, unspecified: Secondary | ICD-10-CM | POA: Diagnosis present

## 2014-11-22 DIAGNOSIS — N61 Mastitis without abscess: Secondary | ICD-10-CM | POA: Diagnosis present

## 2014-11-22 DIAGNOSIS — I10 Essential (primary) hypertension: Secondary | ICD-10-CM | POA: Diagnosis present

## 2014-11-22 DIAGNOSIS — D509 Iron deficiency anemia, unspecified: Secondary | ICD-10-CM | POA: Diagnosis present

## 2014-11-22 DIAGNOSIS — Z79899 Other long term (current) drug therapy: Secondary | ICD-10-CM | POA: Diagnosis not present

## 2014-11-22 DIAGNOSIS — R7989 Other specified abnormal findings of blood chemistry: Secondary | ICD-10-CM | POA: Diagnosis not present

## 2014-11-22 DIAGNOSIS — A599 Trichomoniasis, unspecified: Secondary | ICD-10-CM

## 2014-11-22 DIAGNOSIS — R109 Unspecified abdominal pain: Secondary | ICD-10-CM

## 2014-11-22 DIAGNOSIS — R7401 Elevation of levels of liver transaminase levels: Secondary | ICD-10-CM | POA: Diagnosis present

## 2014-11-22 DIAGNOSIS — R945 Abnormal results of liver function studies: Secondary | ICD-10-CM

## 2014-11-22 DIAGNOSIS — E876 Hypokalemia: Secondary | ICD-10-CM | POA: Diagnosis present

## 2014-11-22 DIAGNOSIS — R197 Diarrhea, unspecified: Secondary | ICD-10-CM

## 2014-11-22 DIAGNOSIS — Z90710 Acquired absence of both cervix and uterus: Secondary | ICD-10-CM | POA: Diagnosis not present

## 2014-11-22 DIAGNOSIS — Z791 Long term (current) use of non-steroidal anti-inflammatories (NSAID): Secondary | ICD-10-CM | POA: Diagnosis not present

## 2014-11-22 HISTORY — DX: Personal history of other medical treatment: Z92.89

## 2014-11-22 HISTORY — DX: Type 2 diabetes mellitus without complications: E11.9

## 2014-11-22 LAB — ETHANOL

## 2014-11-22 LAB — COMPREHENSIVE METABOLIC PANEL
ALK PHOS: 196 U/L — AB (ref 39–117)
ALT: 708 U/L — ABNORMAL HIGH (ref 0–35)
ANION GAP: 11 (ref 5–15)
AST: 438 U/L — ABNORMAL HIGH (ref 0–37)
Albumin: 3.9 g/dL (ref 3.5–5.2)
BUN: 5 mg/dL — AB (ref 6–23)
CO2: 26 mmol/L (ref 19–32)
Calcium: 9.4 mg/dL (ref 8.4–10.5)
Chloride: 98 mmol/L (ref 96–112)
Creatinine, Ser: 0.92 mg/dL (ref 0.50–1.10)
GFR, EST NON AFRICAN AMERICAN: 80 mL/min — AB (ref >90)
GLUCOSE: 220 mg/dL — AB (ref 70–99)
Potassium: 3.3 mmol/L — ABNORMAL LOW (ref 3.5–5.1)
Sodium: 135 mmol/L (ref 135–145)
TOTAL PROTEIN: 7.8 g/dL (ref 6.0–8.3)
Total Bilirubin: 2.3 mg/dL — ABNORMAL HIGH (ref 0.3–1.2)

## 2014-11-22 LAB — HEPATITIS PANEL, ACUTE
HCV Ab: NEGATIVE
HEP A IGM: NONREACTIVE
HEP B S AG: NEGATIVE
Hep B C IgM: NONREACTIVE

## 2014-11-22 LAB — CBC WITH DIFFERENTIAL/PLATELET
Basophils Absolute: 0 10*3/uL (ref 0.0–0.1)
Basophils Relative: 0 % (ref 0–1)
EOS PCT: 2 % (ref 0–5)
Eosinophils Absolute: 0.1 10*3/uL (ref 0.0–0.7)
HCT: 38.9 % (ref 36.0–46.0)
Hemoglobin: 12.9 g/dL (ref 12.0–15.0)
LYMPHS PCT: 23 % (ref 12–46)
Lymphs Abs: 1.3 10*3/uL (ref 0.7–4.0)
MCH: 27.6 pg (ref 26.0–34.0)
MCHC: 33.2 g/dL (ref 30.0–36.0)
MCV: 83.1 fL (ref 78.0–100.0)
Monocytes Absolute: 0.3 10*3/uL (ref 0.1–1.0)
Monocytes Relative: 5 % (ref 3–12)
Neutro Abs: 4 10*3/uL (ref 1.7–7.7)
Neutrophils Relative %: 70 % (ref 43–77)
PLATELETS: 307 10*3/uL (ref 150–400)
RBC: 4.68 MIL/uL (ref 3.87–5.11)
RDW: 14 % (ref 11.5–15.5)
WBC: 5.7 10*3/uL (ref 4.0–10.5)

## 2014-11-22 LAB — URINALYSIS, ROUTINE W REFLEX MICROSCOPIC
Glucose, UA: >1000 mg/dL — AB
Hgb urine dipstick: NEGATIVE
KETONES UR: 40 mg/dL — AB
NITRITE: NEGATIVE
PH: 5.5 (ref 5.0–8.0)
PROTEIN: NEGATIVE mg/dL
SPECIFIC GRAVITY, URINE: 1.028 (ref 1.005–1.030)
UROBILINOGEN UA: 1 mg/dL (ref 0.0–1.0)

## 2014-11-22 LAB — URINE MICROSCOPIC-ADD ON

## 2014-11-22 LAB — PROTIME-INR
INR: 1.18 (ref 0.00–1.49)
PROTHROMBIN TIME: 15.1 s (ref 11.6–15.2)

## 2014-11-22 LAB — GLUCOSE, CAPILLARY: Glucose-Capillary: 121 mg/dL — ABNORMAL HIGH (ref 70–99)

## 2014-11-22 LAB — APTT: aPTT: 36 seconds (ref 24–37)

## 2014-11-22 LAB — LIPASE, BLOOD: Lipase: 70 U/L — ABNORMAL HIGH (ref 11–59)

## 2014-11-22 MED ORDER — ONDANSETRON HCL 4 MG/2ML IJ SOLN: 4.0000 mg | Freq: Four times a day (QID) | INTRAMUSCULAR | Status: DC | PRN

## 2014-11-22 MED ORDER — SODIUM CHLORIDE 0.9 % IV SOLN
INTRAVENOUS | Status: DC
  Administered 2014-11-22 – 2014-11-24 (×5): via INTRAVENOUS

## 2014-11-22 MED ORDER — POTASSIUM CHLORIDE CRYS ER 20 MEQ PO TBCR
20.0000 meq | EXTENDED_RELEASE_TABLET | Freq: Once | ORAL | Status: AC
  Administered 2014-11-22: 20 meq via ORAL
  Filled 2014-11-22: qty 1

## 2014-11-22 MED ORDER — HYDROMORPHONE HCL 1 MG/ML IJ SOLN
1.0000 mg | INTRAMUSCULAR | Status: DC | PRN
  Administered 2014-11-22 – 2014-11-23 (×3): 1 mg via INTRAVENOUS
  Filled 2014-11-22 (×3): qty 1

## 2014-11-22 MED ORDER — HYDROMORPHONE HCL 1 MG/ML IJ SOLN
1.0000 mg | Freq: Once | INTRAMUSCULAR | Status: AC
  Administered 2014-11-22: 1 mg via INTRAVENOUS
  Filled 2014-11-22: qty 1

## 2014-11-22 MED ORDER — HYDROMORPHONE HCL 1 MG/ML IJ SOLN: 1.0000 mg | INTRAMUSCULAR | Status: DC | PRN

## 2014-11-22 MED ORDER — HEPARIN SODIUM (PORCINE) 5000 UNIT/ML IJ SOLN
5000.0000 [IU] | Freq: Three times a day (TID) | INTRAMUSCULAR | Status: DC
  Administered 2014-11-22 – 2014-11-27 (×14): 5000 [IU] via SUBCUTANEOUS
  Filled 2014-11-22 (×17): qty 1

## 2014-11-22 MED ORDER — ONDANSETRON HCL 4 MG/2ML IJ SOLN
4.0000 mg | Freq: Once | INTRAMUSCULAR | Status: AC
  Administered 2014-11-22: 4 mg via INTRAVENOUS
  Filled 2014-11-22: qty 2

## 2014-11-22 MED ORDER — MORPHINE SULFATE 2 MG/ML IJ SOLN
1.0000 mg | INTRAMUSCULAR | Status: DC | PRN
  Administered 2014-11-22 – 2014-11-24 (×2): 1 mg via INTRAVENOUS
  Filled 2014-11-22 (×2): qty 1

## 2014-11-22 MED ORDER — INSULIN ASPART 100 UNIT/ML ~~LOC~~ SOLN
0.0000 [IU] | SUBCUTANEOUS | Status: DC
  Administered 2014-11-22 – 2014-11-23 (×2): 1 [IU] via SUBCUTANEOUS
  Administered 2014-11-23: 2 [IU] via SUBCUTANEOUS
  Administered 2014-11-23 (×2): 1 [IU] via SUBCUTANEOUS
  Administered 2014-11-23: 2 [IU] via SUBCUTANEOUS

## 2014-11-22 MED ORDER — FOLIC ACID 5 MG/ML IJ SOLN
1.0000 mg | Freq: Every day | INTRAMUSCULAR | Status: DC
  Administered 2014-11-22 – 2014-11-23 (×2): 1 mg via INTRAVENOUS
  Filled 2014-11-22 (×3): qty 0.2

## 2014-11-22 MED ORDER — PROMETHAZINE HCL 25 MG/ML IJ SOLN
12.5000 mg | Freq: Four times a day (QID) | INTRAMUSCULAR | Status: DC | PRN
  Administered 2014-11-22: 25 mg via INTRAVENOUS
  Filled 2014-11-22: qty 1

## 2014-11-22 MED ORDER — THIAMINE HCL 100 MG/ML IJ SOLN
100.0000 mg | Freq: Every day | INTRAMUSCULAR | Status: DC
  Administered 2014-11-22 – 2014-11-23 (×2): 100 mg via INTRAVENOUS
  Filled 2014-11-22 (×2): qty 1

## 2014-11-22 MED ORDER — SODIUM CHLORIDE 0.9 % IV BOLUS (SEPSIS)
1000.0000 mL | Freq: Once | INTRAVENOUS | Status: AC
  Administered 2014-11-22: 1000 mL via INTRAVENOUS

## 2014-11-22 NOTE — ED Notes (Signed)
PA at BS.  

## 2014-11-22 NOTE — Progress Notes (Signed)
Utilization review completed.  

## 2014-11-22 NOTE — H&P (Signed)
Please see separate dictated note by [name]  34 Y/o ? G3 P3, S/P hysterectomy-menorrhagia, C-section X3, Body mass index is 44.63 kg/(m^2)., recent diagnosis DM TY2 05/2014, HTN stage 2, evaluated 11/19/14 nausea vomiting abdominal discomfort X3 to 4 weeks Recent start on Victoza 10/2014 0.6 milligrams-->1.2 mg, ~1-2 weeks ago per Dr. Alroy Dust. Attributes nausea vomiting to this. Also has been having diarrhea for the same amount of time. + 4-5 episodes a day, - overt blood, however specks of blood on 4/11, watery diarrhea 5-6 episodes, anorexia, unable to keep down food, Sick children at home but without similar symptoms Taking medications as directed-inclusive of metformin. Rx Flagyl for Trichomonas in urine Sexually active with partner-monogamous?Marland Kitchen Unclear if using condoms or barrier method Represented to ED 4/14, worsening abdominal pain. Initially pain = right upper quadrant with tenderness under right subcostal margin, this a.m. tenderness under left subcostal margin, + Headache-took Tylenol only last night. Subjective fever chills and sweats however did not measure temperature - Vaginal discharge - dysuria, - cough,  Emergency room workup as per Mrs. Eustace Moore note  On exam Morbidly obese pleasant, not in any distress Cannot appreciate icterus, no pallor, Mallampati 3, No JVD S1-S2 RRR Obese abdomen tender in right subcostal left subcostal areas no rebound no guarding  P Obtain Hepatitis panel, GI pathogen panel, HIV  IV saline 1 50 cc per hour, Hold all hypoglycemic agents, Hold all antihypertensives but may use hydralazine if needed Repeat labs in a.m. Rest as per [name]' note  Verneita Griffes, MD [facility] [phone]

## 2014-11-22 NOTE — ED Provider Notes (Signed)
CSN: [id number]     Arrival date & time 11/22/14  4315 History   First MD Initiated Contact with Patient 11/22/14 [phone]     Chief Complaint  Patient presents with  . Abdominal Pain     (Consider location/radiation/quality/duration/timing/severity/associated sxs/prior Treatment) HPI Comments: Patient presents to the emergency department with chief complaints of abdominal pain. She reports persistent abdominal pain, nausea, vomiting, diarrhea since Monday. She was seen here for the same and discharged home with return precautions. Lab testing and exam findings were reassuring on Monday. She states that she returned yesterday for reevaluation, but didn't want to wait for room. In that time, blood work was drawn. In reviewing her CMP, her liver function tests were very high. She now complains of worsening right upper quadrant pain, but states that she has generally diffuse upper abdominal pain. She denies history of heavy alcohol use. There are no aggravating or alleviating factors. Patient denies fevers or chills. She has had persistent nausea, vomiting, diarrhea.  The history is provided by the patient. No language interpreter was used.    Past Medical History  Diagnosis Date  . Microcytic anemia   . Menometrorrhagia   . Hypertension   . Diabetes mellitus without complication    Past Surgical History  Procedure Laterality Date  . Abdominal hysterectomy    . Tonsillectomy    . Cesarean section    . Hip pinning  B/L hips   No family history on file. History  Substance Use Topics  . Smoking status: Former Smoker -- 0.50 packs/day    Types: Cigarettes  . Smokeless tobacco: Not on file  . Alcohol Use: Yes   OB History    No data available     Review of Systems  Constitutional: Negative for fever and chills.  Respiratory: Negative for shortness of breath.   Cardiovascular: Negative for chest pain.  Gastrointestinal: Positive for nausea, vomiting, abdominal pain and diarrhea.  Negative for constipation.  Genitourinary: Negative for dysuria.  All other systems reviewed and are negative.     Allergies  Review of patient's allergies indicates no known allergies.  Home Medications   Prior to Admission medications   Medication Sig Start Date End Date Taking? Authorizing Provider  acetaminophen (TYLENOL) 325 MG tablet Take 650 mg by mouth every 6 (six) hours as needed for mild pain, moderate pain, fever or headache.   Yes Historical Provider, MD  ciprofloxacin (CIPRO) 500 MG tablet Take 1 tablet (500 mg total) by mouth 2 (two) times daily. 11/19/14  Yes Virgel Manifold, MD  glimepiride (AMARYL) 2 MG tablet Take 2 mg by mouth daily with breakfast.   Yes Historical Provider, MD  hydrochlorothiazide (HYDRODIURIL) 25 MG tablet Take 1 tablet (25 mg total) by mouth daily. 03/19/14  Yes Orpah Greek, MD  lisinopril (PRINIVIL,ZESTRIL) 40 MG tablet Take 40 mg by mouth daily.   Yes Historical Provider, MD  metroNIDAZOLE (FLAGYL) 500 MG tablet Take 1 tablet (500 mg total) by mouth 3 (three) times daily. 11/19/14  Yes Virgel Manifold, MD  ondansetron (ZOFRAN) 4 MG tablet Take 1 tablet (4 mg total) by mouth every 6 (six) hours. 11/19/14  Yes Virgel Manifold, MD  traMADol (ULTRAM) 50 MG tablet Take 1 tablet (50 mg total) by mouth every 6 (six) hours as needed. 11/19/14  Yes Virgel Manifold, MD  ibuprofen (ADVIL,MOTRIN) 800 MG tablet Take 800 mg by mouth every 8 (eight) hours as needed for mild pain.    Historical Provider, MD  Liraglutide 18  MG/3ML SOPN Inject 1.2 mLs into the skin daily.    Historical Provider, MD  metFORMIN (GLUCOPHAGE) 500 MG tablet Take 500 mg by mouth 2 (two) times daily with a meal.    Historical Provider, MD   BP 151/78 mmHg  Pulse 71  Temp(Src) 97.9 F (36.6 C) (Oral)  Ht 5\' 7"  (1.702 m)  Wt 285 lb (129.275 kg)  BMI 44.63 kg/m2  SpO2 98%  LMP 01/05/2011 Physical Exam  Constitutional: She is oriented to person, place, and time. She appears  well-developed and well-nourished.  HENT:  Head: Normocephalic and atraumatic.  Eyes: Conjunctivae and EOM are normal. Pupils are equal, round, and reactive to light.  Neck: Normal range of motion. Neck supple.  Cardiovascular: Normal rate and regular rhythm.  Exam reveals no gallop and no friction rub.   No murmur heard. Pulmonary/Chest: Effort normal and breath sounds normal. No respiratory distress. She has no wheezes. She has no rales. She exhibits no tenderness.  Abdominal: Soft. Bowel sounds are normal. She exhibits no distension and no mass. There is no tenderness. There is no rebound and no guarding.  Tenderness to palpation of the right upper quadrant and epigastrium, no lower abdominal tenderness  Musculoskeletal: Normal range of motion. She exhibits no edema or tenderness.  Neurological: She is alert and oriented to person, place, and time.  Skin: Skin is warm and dry.  Psychiatric: She has a normal mood and affect. Her behavior is normal. Judgment and thought content normal.  Nursing note and vitals reviewed.   ED Course  Procedures (including critical care time) Results for orders placed or performed during the hospital encounter of 11/22/14  CBC with Differential  Result Value Ref Range   WBC 5.7 4.0 - 10.5 K/uL   RBC 4.68 3.87 - 5.11 MIL/uL   Hemoglobin 12.9 12.0 - 15.0 g/dL   HCT 38.9 36.0 - 46.0 %   MCV 83.1 78.0 - 100.0 fL   MCH 27.6 26.0 - 34.0 pg   MCHC 33.2 30.0 - 36.0 g/dL   RDW 14.0 11.5 - 15.5 %   Platelets 307 150 - 400 K/uL   Neutrophils Relative % 70 43 - 77 %   Neutro Abs 4.0 1.7 - 7.7 K/uL   Lymphocytes Relative 23 12 - 46 %   Lymphs Abs 1.3 0.7 - 4.0 K/uL   Monocytes Relative 5 3 - 12 %   Monocytes Absolute 0.3 0.1 - 1.0 K/uL   Eosinophils Relative 2 0 - 5 %   Eosinophils Absolute 0.1 0.0 - 0.7 K/uL   Basophils Relative 0 0 - 1 %   Basophils Absolute 0.0 0.0 - 0.1 K/uL  Comprehensive metabolic panel  Result Value Ref Range   Sodium 135 135 -  145 mmol/L   Potassium 3.3 (L) 3.5 - 5.1 mmol/L   Chloride 98 96 - 112 mmol/L   CO2 26 19 - 32 mmol/L   Glucose, Bld 220 (H) 70 - 99 mg/dL   BUN 5 (L) 6 - 23 mg/dL   Creatinine, Ser 0.92 0.50 - 1.10 mg/dL   Calcium 9.4 8.4 - 10.5 mg/dL   Total Protein 7.8 6.0 - 8.3 g/dL   Albumin 3.9 3.5 - 5.2 g/dL   AST 438 (H) 0 - 37 U/L   ALT 708 (H) 0 - 35 U/L   Alkaline Phosphatase 196 (H) 39 - 117 U/L   Total Bilirubin 2.3 (H) 0.3 - 1.2 mg/dL   GFR calc non Af Amer 80 (L) >90  mL/min   GFR calc Af Amer >90 >90 mL/min   Anion gap 11 5 - 15  Urinalysis, Routine w reflex microscopic  Result Value Ref Range   Color, Urine AMBER (A) YELLOW   APPearance CLOUDY (A) CLEAR   Specific Gravity, Urine 1.028 1.005 - 1.030   pH 5.5 5.0 - 8.0   Glucose, UA >1000 (A) NEGATIVE mg/dL   Hgb urine dipstick NEGATIVE NEGATIVE   Bilirubin Urine MODERATE (A) NEGATIVE   Ketones, ur 40 (A) NEGATIVE mg/dL   Protein, ur NEGATIVE NEGATIVE mg/dL   Urobilinogen, UA 1.0 0.0 - 1.0 mg/dL   Nitrite NEGATIVE NEGATIVE   Leukocytes, UA SMALL (A) NEGATIVE  Lipase, blood  Result Value Ref Range   Lipase 70 (H) 11 - 59 U/L  Urine microscopic-add on  Result Value Ref Range   Squamous Epithelial / LPF MANY (A) RARE   WBC, UA 0-2 <3 WBC/hpf   RBC / HPF 0-2 <3 RBC/hpf   Bacteria, UA FEW (A) RARE  Ethanol  Result Value Ref Range   Alcohol, Ethyl (B) <5 0 - 9 mg/dL   US Abdomen Limited  11/22/2014   CLINICAL DATA:  Two week history of right upper quadrant pain  EXAM: US ABDOMEN LIMITED - RIGHT UPPER QUADRANT  COMPARISON:  None.  FINDINGS: Gallbladder:  No gallstones or wall thickening visualized. There is no pericholecystic fluid. No sonographic Murphy sign noted.  Common bile duct:  Diameter: 4 mm. There is no intrahepatic or extrahepatic biliary duct dilatation.  Liver:  No focal lesion identified. Liver echogenicity overall is increased.  IMPRESSION: Increased liver echogenicity, a finding most likely due to hepatic steatosis.  While no focal liver lesions are identified, it must be cautioned that the sensitivity of ultrasound focal liver lesions is diminished in this circumstance. Study otherwise unremarkable.   Electronically Signed   By: Lowella Grip III M.D.   On: 11/22/2014 09:18      EKG Interpretation None      MDM   Final diagnoses:  RUQ abdominal pain  Elevated LFTs    Patient with right upper quadrant pain, nausea, vomiting, diarrhea. Given patient's elevated LFTs, and lipase, will check right upper quadrant ultrasound.  Right upper quadrant ultrasound negative for cholecystitis. LFTs remain markedly elevated. Patient still having significant pain. Patient discussed with Dr. Darl Householder, who recommends adding hepatitis panel and admission to the hospital.  Patient discussed with Toni Arthurs from [facility], who will admit the patient. Appreciate hospitalist team for admission.      Montine Circle, PA-C 11/22/14 1243  Wandra Arthurs, MD 11/22/14 [phone]

## 2014-11-22 NOTE — H&P (Signed)
[facility] History and Physical                                                                                    Rachel Dawson, is a 34 y.o. female  MRN: [id number]   DOB - 1980-08-30  Admit Date - 11/22/2014  Outpatient Primary MD for the patient is Donnie Coffin, MD  With History of -  Past Medical History  Diagnosis Date  . Microcytic anemia   . Menometrorrhagia   . Hypertension   . Diabetes mellitus without complication       Past Surgical History  Procedure Laterality Date  . Abdominal hysterectomy    . Tonsillectomy    . Cesarean section    . Hip pinning  B/L hips    in for   Chief Complaint  Patient presents with  . Abdominal Pain     HPI 34 year old female patient with past medical history of obesity, hypertension and diabetes mellitus. She initially presented to the ER on 4/11 with nausea, vomiting and diarrhea. During that evaluation her laboratory data was unremarkable and she had a benign abdominal examination. She was given IV fluids and discharged home. Patient returns to the ER today with persistence of symptoms. In discussing with the patient she began having gastrointestinal symptoms one month prior; primarily diarrhea without abdominal pain and this correlated with initiation of Victoza therapy. Her dose was increased slowly in a stepwise fashion per her primary care physician and this is when she noticed progression of her GI symptoms (which later became associated with nausea and vomiting and burning epigastric and left upper quadrant abdominal pain). The diarrhea also worsened. Beginning this past Sunday her symptoms seem to have worsened and were associated with subjective fevers and chills. In the past 24 hours she had noticed her urine had also become more dark. She reports over the past 48 hours she's had at least 3-4 emesis episodes per day but is having more frequent watery diarrheal stools (at least 5-6 per day) and in the past 24 hours  greater than 10 per day. She has not noticed any frank blood or melena with either emesis or diarrhea but did notice some specks of blood recently with emesis. Initially her abdominal discomfort was primarily a burning epigastric pain that eventually began radiating to the left upper quadrant and in the past 24 hours has also radiated to the right upper quadrant. She describes this pain as burning, sharp and twisting in nature and has become near constant. Patient is also on metformin but has been on this medication since September. She did have issues related to diarrhea which improved after adjusting to nocturnal dosing several months ago. During her initial ER evaluation on 4/11 her urinalysis was positive for Trichomonas and because of persistent GI symptoms her primary care physician started her on oral Flagyl on 4/13. Patient has only taken 1 dose of Tylenol for headache this morning. She does have a history of remote blood transfusions with surgical procedures.  In the ER she was afebrile and hemodynamically stable and not hypoxemic. She is being given a liter of IV fluids by the ER physician. In review  of laboratory data appears the patient had outpatient labs drawn on 4/13 which revealed alkaline phosphatase 151, lipase 94, AST 952, ALT 782 and total bilirubin 2.2. CBC was normal except for mildly elevated neutrophils at 81%. Today in the ER she continues with transaminitis with alkaline phosphatase 196, lipase 70, AST down to 438, and ALT down to 708 with total bilirubin-year-old the same at 2.3. Again CBC is within normal limits. Urinalysis demonstrated cloudy urine with an elevated urine specific gravity 1.028. She also had significant glycosuria with greater than 1000 glucose, moderate bilirubin, 40 ketones and a small amount leukocytes. An abdominal ultrasound was completed that revealed hepatic steatosis that no evidence of gallstones or acute cholecystitis.  Review of Systems   In addition to  the HPI above,  No myalgias or other constitutional symptoms No changes with Vision or hearing, new weakness, tingling, numbness in any extremity, No problems swallowing food or Liquids, indigestion/reflux No Chest pain, Shortness of Breath, palpitations, orthopnea or DOE No dysuria, hematuria or flank pain No new skin rashes, lesions, masses or bruises, No new joints pains-aches No recent weight gain or loss No polyuria, polydypsia or polyphagia,  *A full 10 point Review of Systems was done, except as stated above, all other Review of Systems were negative.  Social History History  Substance Use Topics  . Smoking status: Former Smoker -- 0.50 packs/day    Types: Cigarettes  . Smokeless tobacco: Not on file  . Alcohol Use: Yes    Family History No family history on file.  Prior to Admission medications   Medication Sig Start Date End Date Taking? Authorizing Provider  acetaminophen (TYLENOL) 325 MG tablet Take 650 mg by mouth every 6 (six) hours as needed for mild pain, moderate pain, fever or headache.   Yes Historical Provider, MD  ciprofloxacin (CIPRO) 500 MG tablet Take 1 tablet (500 mg total) by mouth 2 (two) times daily. 11/19/14  Yes Virgel Manifold, MD  glimepiride (AMARYL) 2 MG tablet Take 2 mg by mouth daily with breakfast.   Yes Historical Provider, MD  hydrochlorothiazide (HYDRODIURIL) 25 MG tablet Take 1 tablet (25 mg total) by mouth daily. 03/19/14  Yes Orpah Greek, MD  lisinopril (PRINIVIL,ZESTRIL) 40 MG tablet Take 40 mg by mouth daily.   Yes Historical Provider, MD  metroNIDAZOLE (FLAGYL) 500 MG tablet Take 1 tablet (500 mg total) by mouth 3 (three) times daily. 11/19/14  Yes Virgel Manifold, MD  ondansetron (ZOFRAN) 4 MG tablet Take 1 tablet (4 mg total) by mouth every 6 (six) hours. 11/19/14  Yes Virgel Manifold, MD  traMADol (ULTRAM) 50 MG tablet Take 1 tablet (50 mg total) by mouth every 6 (six) hours as needed. 11/19/14  Yes Virgel Manifold, MD  ibuprofen  (ADVIL,MOTRIN) 800 MG tablet Take 800 mg by mouth every 8 (eight) hours as needed for mild pain.    Historical Provider, MD  Liraglutide 18 MG/3ML SOPN Inject 1.2 mLs into the skin daily.    Historical Provider, MD  metFORMIN (GLUCOPHAGE) 500 MG tablet Take 500 mg by mouth 2 (two) times daily with a meal.    Historical Provider, MD    No Known Allergies  Physical Exam  Vitals  Blood pressure 141/68, pulse 56, temperature 97.9 F (36.6 C), temperature source Oral, height 5\' 7"  (1.702 m), weight 285 lb (129.275 kg), last menstrual period 01/05/2011, SpO2 96 %.   General:  In mild acute distress evidence for ongoing abdominal pain with nausea  Psych:  Normal affect,  Denies Suicidal or Homicidal ideations, Awake Alert, Oriented X 3. Speech and thought patterns are clear and appropriate, no apparent short term memory deficits  Neuro:   No focal neurological deficits, CN II through XII intact, Strength 5/5 all 4 extremities, Sensation intact all 4 extremities.  ENT:  Ears and Eyes appear Normal, Conjunctivae clear, PER. Moist oral mucosa without erythema or exudates.  Neck:  Supple, No lymphadenopathy appreciated  Respiratory:  Symmetrical chest wall movement, Good air movement bilaterally, CTAB. Room Air  Cardiac:  RRR, No Murmurs, no LE edema noted, no JVD, No carotid bruits, peripheral pulses palpable at 2+  Abdomen:  Positive moderately active bowel sounds, Soft, tender throughout upper abdomen without guarding or rebounding, Non distended,  No masses appreciated, liver border as well as spleen were palpable but no evidence of marked enlargement  Skin:  No Cyanosis, Normal Skin Turgor, No Skin Rash or Bruise. Patient has tattoo right forearm  Extremities: Symmetrical without obvious trauma or injury,  no effusions.  Data Review  CBC  Recent Labs Lab 11/19/14 0810 11/21/14 1639 11/22/14 0745  WBC 8.3 8.4 5.7  HGB 12.5 12.7 12.9  HCT 38.4 38.7 38.9  PLT 321 321 307  MCV  82.8 83.2 83.1  MCH 26.9 27.3 27.6  MCHC 32.6 32.8 33.2  RDW 13.9 14.0 14.0  LYMPHSABS 1.9 1.1 1.3  MONOABS 0.3 0.4 0.3  EOSABS 0.1 0.1 0.1  BASOSABS 0.0 0.0 0.0    Chemistries   Recent Labs Lab 11/19/14 0810 11/21/14 1639 11/22/14 0745  NA 136 135 135  K 3.6 3.6 3.3*  CL 101 99 98  CO2 23 25 26   GLUCOSE 182* 172* 220*  BUN 9 6 5*  CREATININE 0.79 0.77 0.92  CALCIUM 9.5 9.7 9.4  AST 17 952* 438*  ALT 28 782* 708*  ALKPHOS 71 151* 196*  BILITOT 0.5 2.2* 2.3*    estimated creatinine clearance is 119.5 mL/min (by C-G formula based on Cr of 0.92).  No results for input(s): TSH, T4TOTAL, T3FREE, THYROIDAB in the last 72 hours.  Invalid input(s): FREET3  Coagulation profile No results for input(s): INR, PROTIME in the last 168 hours.  No results for input(s): DDIMER in the last 72 hours.  Cardiac Enzymes No results for input(s): CKMB, TROPONINI, MYOGLOBIN in the last 168 hours.  Invalid input(s): CK  Invalid input(s): POCBNP  Urinalysis    Component Value Date/Time   COLORURINE AMBER* 11/22/2014 0830   APPEARANCEUR CLOUDY* 11/22/2014 0830   LABSPEC 1.028 11/22/2014 0830   PHURINE 5.5 11/22/2014 0830   GLUCOSEU >1000* 11/22/2014 0830   HGBUR NEGATIVE 11/22/2014 0830   BILIRUBINUR MODERATE* 11/22/2014 0830   KETONESUR 40* 11/22/2014 0830   PROTEINUR NEGATIVE 11/22/2014 0830   UROBILINOGEN 1.0 11/22/2014 0830   NITRITE NEGATIVE 11/22/2014 0830   LEUKOCYTESUR SMALL* 11/22/2014 0830    Imaging results:   US Abdomen Limited  11/22/2014   CLINICAL DATA:  Two week history of right upper quadrant pain  EXAM: US ABDOMEN LIMITED - RIGHT UPPER QUADRANT  COMPARISON:  None.  FINDINGS: Gallbladder:  No gallstones or wall thickening visualized. There is no pericholecystic fluid. No sonographic Murphy sign noted.  Common bile duct:  Diameter: 4 mm. There is no intrahepatic or extrahepatic biliary duct dilatation.  Liver:  No focal lesion identified. Liver echogenicity  overall is increased.  IMPRESSION: Increased liver echogenicity, a finding most likely due to hepatic steatosis. While no focal liver lesions are identified, it must be cautioned that the  sensitivity of ultrasound focal liver lesions is diminished in this circumstance. Study otherwise unremarkable.   Electronically Signed   By: Lowella Grip III M.D.   On: 11/22/2014 09:18     Assessment & Plan  Principal Problem:   Transaminitis -Admit to general floor -Follow up on acute hepatitis panel -No evidence of obstructive biliary process such as acute cholecystitis or choledocholithiasis -Patient is on several medications that certainly could contribute to transaminitis especially in the setting of ongoing nausea and vomiting with associated dehydration therefore holding all home medications and minimizing medications such as Tylenol -Continue supportive care -Check coags -Check HIV  Active Problems:   Moderate dehydration/Nausea vomiting and diarrhea -Differential includes acute hepatitis versus viral gastroenteritis versus side effects from Victoza -In review of adverse effects regarding Victoza, pancreatitis is listed; this patient has been experiencing some left upper quadrant pain but the degree of her elevation in lipase is minimal in comparison to her symptomatology and the elevation in her transaminases leading one to suspect that current elevations in lipase are more reflective of her ongoing emesis and diarrhea -As a precaution we'll hold potentially offending medications primarily Victoza and ACE inhibitors noting both can lead to pancreatitis-type symptoms -Clear liquids initially -Continue supportive care with anti-emetics and IV fluids as above -Having mild abdominal pain so provide low-dose IV morphine when necessary    Hypertension -Current blood pressure is controlled -Holding home diuretic and ACE inhibitor -If needed can utilize when necessary IV Lopressor    Diabetes  mellitus type 2, uncontrolled -Serum glucose was 220 at presentation -Because of current symptomatology we are holding Amaryl, Victoza and metformin -For now check CBGs every 4 hours and provide sliding scale insulin -Consider inpatient only use of long-acting insulin such as Lantus until current symptoms and resolved and etiology of current illness identified -Check hemoglobin A1c-patient reports last check sometime after October 2015 was around 10    Microcytic anemia -Hemoglobin stable and around baseline of 12    DVT Prophylaxis: Subcutaneous heparin  Family Communication:   Sister at bedside  Code Status:  Full code  Condition:  Stable  Time spent in minutes : 60   Phyllis Abelson L. ANP on 11/22/2014 at 11:55 AM  Between 7am to 7pm - Pager - [phone]  After 7pm go to [email or web address] - password [email or web address]  And look for the night coverage person covering me after hours  [facility]

## 2014-11-22 NOTE — ED Notes (Signed)
Pt presents to department with abdominal pain, dx with colitis on Monday. Pt reports N/V/D. States she has been on abx for 2 days. Pt states she is not getting any better.

## 2014-11-23 DIAGNOSIS — K859 Acute pancreatitis without necrosis or infection, unspecified: Secondary | ICD-10-CM | POA: Diagnosis present

## 2014-11-23 DIAGNOSIS — E1165 Type 2 diabetes mellitus with hyperglycemia: Secondary | ICD-10-CM

## 2014-11-23 DIAGNOSIS — E669 Obesity, unspecified: Secondary | ICD-10-CM | POA: Diagnosis present

## 2014-11-23 DIAGNOSIS — A599 Trichomoniasis, unspecified: Secondary | ICD-10-CM | POA: Diagnosis present

## 2014-11-23 DIAGNOSIS — N61 Mastitis without abscess: Secondary | ICD-10-CM | POA: Diagnosis present

## 2014-11-23 DIAGNOSIS — K7581 Nonalcoholic steatohepatitis (NASH): Secondary | ICD-10-CM | POA: Diagnosis present

## 2014-11-23 DIAGNOSIS — R7989 Other specified abnormal findings of blood chemistry: Secondary | ICD-10-CM

## 2014-11-23 DIAGNOSIS — I10 Essential (primary) hypertension: Secondary | ICD-10-CM

## 2014-11-23 LAB — COMPREHENSIVE METABOLIC PANEL
ALBUMIN: 3.3 g/dL — AB (ref 3.5–5.2)
ALT: 391 U/L — ABNORMAL HIGH (ref 0–35)
AST: 109 U/L — ABNORMAL HIGH (ref 0–37)
Alkaline Phosphatase: 144 U/L — ABNORMAL HIGH (ref 39–117)
Anion gap: 9 (ref 5–15)
BILIRUBIN TOTAL: 0.7 mg/dL (ref 0.3–1.2)
BUN: 5 mg/dL — ABNORMAL LOW (ref 6–23)
CALCIUM: 8.6 mg/dL (ref 8.4–10.5)
CO2: 24 mmol/L (ref 19–32)
Chloride: 105 mmol/L (ref 96–112)
Creatinine, Ser: 0.71 mg/dL (ref 0.50–1.10)
Glucose, Bld: 168 mg/dL — ABNORMAL HIGH (ref 70–99)
POTASSIUM: 3.5 mmol/L (ref 3.5–5.1)
Sodium: 138 mmol/L (ref 135–145)
TOTAL PROTEIN: 6.3 g/dL (ref 6.0–8.3)

## 2014-11-23 LAB — CBC
HCT: 35.6 % — ABNORMAL LOW (ref 36.0–46.0)
Hemoglobin: 11.4 g/dL — ABNORMAL LOW (ref 12.0–15.0)
MCH: 27 pg (ref 26.0–34.0)
MCHC: 32 g/dL (ref 30.0–36.0)
MCV: 84.4 fL (ref 78.0–100.0)
PLATELETS: 282 10*3/uL (ref 150–400)
RBC: 4.22 MIL/uL (ref 3.87–5.11)
RDW: 14.3 % (ref 11.5–15.5)
WBC: 6.8 10*3/uL (ref 4.0–10.5)

## 2014-11-23 LAB — HEMOGLOBIN A1C
Hgb A1c MFr Bld: 9.1 % — ABNORMAL HIGH (ref 4.8–5.6)
MEAN PLASMA GLUCOSE: 214 mg/dL

## 2014-11-23 LAB — GLUCOSE, CAPILLARY
GLUCOSE-CAPILLARY: 123 mg/dL — AB (ref 70–99)
GLUCOSE-CAPILLARY: 117 mg/dL — AB (ref 70–99)
Glucose-Capillary: 125 mg/dL — ABNORMAL HIGH (ref 70–99)
Glucose-Capillary: 139 mg/dL — ABNORMAL HIGH (ref 70–99)
Glucose-Capillary: 153 mg/dL — ABNORMAL HIGH (ref 70–99)
Glucose-Capillary: 198 mg/dL — ABNORMAL HIGH (ref 70–99)
Glucose-Capillary: 176 mg/dL — ABNORMAL HIGH (ref 70–99)

## 2014-11-23 LAB — HIV ANTIBODY (ROUTINE TESTING W REFLEX): HIV Screen 4th Generation wRfx: NONREACTIVE

## 2014-11-23 LAB — CLOSTRIDIUM DIFFICILE BY PCR: CDIFFPCR: NEGATIVE

## 2014-11-23 MED ORDER — LISINOPRIL 40 MG PO TABS
40.0000 mg | ORAL_TABLET | Freq: Every day | ORAL | Status: DC
  Administered 2014-11-23 – 2014-11-27 (×5): 40 mg via ORAL
  Filled 2014-11-23 (×5): qty 1

## 2014-11-23 MED ORDER — LABETALOL HCL 5 MG/ML IV SOLN
10.0000 mg | INTRAVENOUS | Status: DC | PRN
  Administered 2014-11-23 – 2014-11-25 (×4): 10 mg via INTRAVENOUS
  Filled 2014-11-23 (×7): qty 4

## 2014-11-23 MED ORDER — INSULIN ASPART 100 UNIT/ML ~~LOC~~ SOLN
0.0000 [IU] | Freq: Three times a day (TID) | SUBCUTANEOUS | Status: DC
  Administered 2014-11-24 (×2): 3 [IU] via SUBCUTANEOUS
  Administered 2014-11-24 – 2014-11-25 (×2): 2 [IU] via SUBCUTANEOUS

## 2014-11-23 NOTE — Progress Notes (Signed)
[facility] PROGRESS NOTE  Rachel Dawson YBW:[id number] DOB: [birth year] DOA: 11/22/2014 PCP: Donnie Coffin, MD  Summary Chart reviewed. 34 y.o aa female recently started on victoza. Has had worsening n/v/d since then. Also started on antibiotics. PTA after ED visit. Returned with worse pain. Found to have new increased LFTs and lipase  Assessment/Plan:  Principal Problem:   Transaminitis: acute hep panel negative. Decreasing. [place] just shows fatty liver. monitor Active Problems:   Microcytic anemia   Hypertension: bp increasing. Will resume ACE I   Diabetes mellitus type 2, uncontrolled: SSI only for now   Moderate dehydration: continue IVF. Still with not much UOP   Nausea vomiting and diarrhea:  c diff neg. GI pathogen panel pending. Likely at least in part due to [place].  Stools becoming more formed   Pancreatitis, acute: likely related to victoza. Can cause. Has LUQ tenderness and describes pain consistent with same   Trichomonas vaginalis infection: eventual treatment of pt and partner when more stable   Obesity   Steatohepatitis, non-alcoholic   HPI/Subjective: Still some nausea. Little UOP. Pain slightly improved. Was epigastric and LUQ radiating to back.  Objective: Filed Vitals:   11/23/14 0512  BP: 154/85  Pulse: 62  Temp: 98.6 F (37 C)  Resp: 18    Intake/Output Summary (Last 24 hours) at 11/23/14 1340 Last data filed at 11/23/14 1238  Gross per 24 hour  Intake 2947.5 ml  Output      0 ml  Net 2947.5 ml   Filed Weights   11/22/14 0741 11/22/14 1533  Weight: 129.275 kg (285 lb) 138.9 kg (306 lb 3.5 oz)    Exam:   General:  A and o in chair  Cardiovascular: RRR without MGR  Respiratory: CTA without WRR  Abdomen: S, obese. LUQ and epigastrum tender. Normal BS  Ext: no CCE  Basic Metabolic Panel:  Recent Labs Lab 11/19/14 0810 11/21/14 1639 11/22/14 0745 11/23/14 0604  NA 136 135 135 138  K 3.6 3.6 3.3* 3.5  CL 101 99 98 105   CO2 23 25 26 24   GLUCOSE 182* 172* 220* 168*  BUN 9 6 5* 5*  CREATININE 0.79 0.77 0.92 0.71  CALCIUM 9.5 9.7 9.4 8.6   Liver Function Tests:  Recent Labs Lab 11/19/14 0810 11/21/14 1639 11/22/14 0745 11/23/14 0604  AST 17 952* 438* 109*  ALT 28 782* 708* 391*  ALKPHOS 71 151* 196* 144*  BILITOT 0.5 2.2* 2.3* 0.7  PROT 7.5 7.7 7.8 6.3  ALBUMIN 4.1 3.9 3.9 3.3*    Recent Labs Lab 11/21/14 1639 11/22/14 0745  LIPASE 94* 70*   No results for input(s): AMMONIA in the last 168 hours. CBC:  Recent Labs Lab 11/19/14 0810 11/21/14 1639 11/22/14 0745 11/23/14 0604  WBC 8.3 8.4 5.7 6.8  NEUTROABS 6.0 6.8 4.0  --   HGB 12.5 12.7 12.9 11.4*  HCT 38.4 38.7 38.9 35.6*  MCV 82.8 83.2 83.1 84.4  PLT 321 321 307 282   Cardiac Enzymes: No results for input(s): CKTOTAL, CKMB, CKMBINDEX, TROPONINI in the last 168 hours. BNP (last 3 results) No results for input(s): BNP in the last 8760 hours.  ProBNP (last 3 results)  Recent Labs  03/19/14 1314  PROBNP 61.0    CBG:  Recent Labs Lab 11/22/14 2015 11/23/14 0018 11/23/14 0433 11/23/14 0816 11/23/14 1218  GLUCAP 121* 125* 139* 153* 123*    Recent Results (from the past 240 hour(s))  Clostridium Difficile by PCR  Status: None   Collection Time: 11/23/14 10:16 AM  Result Value Ref Range Status   C difficile by pcr NEGATIVE NEGATIVE Final     Studies: US Abdomen Limited  11/22/2014   CLINICAL DATA:  Two week history of right upper quadrant pain  EXAM: US ABDOMEN LIMITED - RIGHT UPPER QUADRANT  COMPARISON:  None.  FINDINGS: Gallbladder:  No gallstones or wall thickening visualized. There is no pericholecystic fluid. No sonographic Murphy sign noted.  Common bile duct:  Diameter: 4 mm. There is no intrahepatic or extrahepatic biliary duct dilatation.  Liver:  No focal lesion identified. Liver echogenicity overall is increased.  IMPRESSION: Increased liver echogenicity, a finding most likely due to hepatic  steatosis. While no focal liver lesions are identified, it must be cautioned that the sensitivity of ultrasound focal liver lesions is diminished in this circumstance. Study otherwise unremarkable.   Electronically Signed   By: Lowella Grip III M.D.   On: 11/22/2014 09:18    Scheduled Meds: . folic acid  1 mg Intravenous Daily  . heparin  5,000 Units Subcutaneous 3 times per day  . insulin aspart  0-9 Units Subcutaneous 6 times per day  . thiamine  100 mg Intravenous Daily   Continuous Infusions: . sodium chloride 150 mL/hr at 11/23/14 0012    Time spent: 35 minutes  [facility] [email or web address], password [facility] 11/23/2014, 1:40 PM  LOS: 1 day

## 2014-11-24 DIAGNOSIS — R197 Diarrhea, unspecified: Secondary | ICD-10-CM

## 2014-11-24 DIAGNOSIS — K858 Other acute pancreatitis: Secondary | ICD-10-CM

## 2014-11-24 DIAGNOSIS — R112 Nausea with vomiting, unspecified: Secondary | ICD-10-CM

## 2014-11-24 LAB — GLUCOSE, CAPILLARY
GLUCOSE-CAPILLARY: 215 mg/dL — AB (ref 70–99)
Glucose-Capillary: 182 mg/dL — ABNORMAL HIGH (ref 70–99)
Glucose-Capillary: 208 mg/dL — ABNORMAL HIGH (ref 70–99)
Glucose-Capillary: 171 mg/dL — ABNORMAL HIGH (ref 70–99)

## 2014-11-24 LAB — COMPREHENSIVE METABOLIC PANEL
ALBUMIN: 3.3 g/dL — AB (ref 3.5–5.2)
ALK PHOS: 125 U/L — AB (ref 39–117)
ALT: 269 U/L — ABNORMAL HIGH (ref 0–35)
AST: 39 U/L — ABNORMAL HIGH (ref 0–37)
Anion gap: 9 (ref 5–15)
CALCIUM: 8.7 mg/dL (ref 8.4–10.5)
CHLORIDE: 105 mmol/L (ref 96–112)
CO2: 24 mmol/L (ref 19–32)
CREATININE: 0.62 mg/dL (ref 0.50–1.10)
GFR calc Af Amer: >90 mL/min (ref >90)
GFR calc non Af Amer: >90 mL/min (ref >90)
GLUCOSE: 189 mg/dL — AB (ref 70–99)
Potassium: 3.3 mmol/L — ABNORMAL LOW (ref 3.5–5.1)
Sodium: 138 mmol/L (ref 135–145)
Total Bilirubin: 0.4 mg/dL (ref 0.3–1.2)
Total Protein: 6.8 g/dL (ref 6.0–8.3)

## 2014-11-24 MED ORDER — SODIUM CHLORIDE 0.9 % IJ SOLN: 3.0000 mL | INTRAMUSCULAR | Status: DC | PRN

## 2014-11-24 MED ORDER — SODIUM CHLORIDE 0.9 % IV SOLN: 250.0000 mL | INTRAVENOUS | Status: DC | PRN

## 2014-11-24 MED ORDER — OXYCODONE HCL 5 MG PO TABS
5.0000 mg | ORAL_TABLET | Freq: Four times a day (QID) | ORAL | Status: DC | PRN
  Administered 2014-11-24 – 2014-11-25 (×3): 5 mg via ORAL
  Filled 2014-11-24 (×3): qty 1

## 2014-11-24 MED ORDER — PROMETHAZINE HCL 25 MG PO TABS
12.5000 mg | ORAL_TABLET | Freq: Four times a day (QID) | ORAL | Status: DC | PRN
  Administered 2014-11-25 – 2014-11-26 (×3): 12.5 mg via ORAL
  Filled 2014-11-24 (×3): qty 1

## 2014-11-24 MED ORDER — SODIUM CHLORIDE 0.9 % IJ SOLN
3.0000 mL | Freq: Two times a day (BID) | INTRAMUSCULAR | Status: DC
  Administered 2014-11-24 – 2014-11-26 (×6): 3 mL via INTRAVENOUS

## 2014-11-24 MED ORDER — POTASSIUM CHLORIDE CRYS ER 20 MEQ PO TBCR
40.0000 meq | EXTENDED_RELEASE_TABLET | Freq: Once | ORAL | Status: AC
  Administered 2014-11-24: 40 meq via ORAL
  Filled 2014-11-24: qty 2

## 2014-11-24 NOTE — Progress Notes (Signed)
[facility] PROGRESS NOTE  Rachel Dawson EHO:[id number] DOB: [birth year] DOA: 11/22/2014 PCP: Donnie Coffin, MD  Summary Chart reviewed. 34 y.o aa female recently started on victoza. Has had worsening n/v/d since then. Also started on antibiotics. PTA after ED visit. Returned with worse pain. Found to have new increased LFTs and lipase  Assessment/Plan:  Principal Problem:   Transaminitis: acute hep panel negative. Decreasing. [place] just shows fatty liver.  Active Problems:   Microcytic anemia   Hypertension:  resumed ACE I   Diabetes mellitus type 2, uncontrolled: SSI only for now. Check hgb a1c   Moderate dehydration: increasing UOP and drinking fluids. Saline lock   Nausea vomiting and diarrhea:  c diff neg. GI pathogen panel pending. Likely at least in part due to [place]. Tolerating solids, but still with occasional nausea and pain   Pancreatitis, acute: likely related to [place]. Can cause. Has LUQ tenderness and describes pain consistent with same. Check lipase in am and triglycerides   Trichomonas vaginalis infection: eventual treatment of pt and partner when more stable   Obesity   Steatohepatitis, non-alcoholic Hypokalemia: replete  Saline lock. Increase activity. Change meds to po  HPI/Subjective: Still some nausea, but eating better. UOP picking up. Pain slightly improved. Was epigastric and LUQ radiating to back.  Doesn't feel well enough to go home.  Objective: Filed Vitals:   11/24/14 0611  BP: 155/85  Pulse: 69  Temp: 98.7 F (37.1 C)  Resp: 17    Intake/Output Summary (Last 24 hours) at 11/24/14 1133 Last data filed at 11/24/14 0949  Gross per 24 hour  Intake 2542.5 ml  Output      0 ml  Net 2542.5 ml   Filed Weights   11/22/14 0741 11/22/14 1533  Weight: 129.275 kg (285 lb) 138.9 kg (306 lb 3.5 oz)    Exam:   General:  A and o in chair  Cardiovascular: RRR without MGR  Respiratory: CTA without WRR  Abdomen: S, obese. LUQ and epigastrum  tender. Normal BS  Ext: no CCE  Basic Metabolic Panel:  Recent Labs Lab 11/19/14 0810 11/21/14 1639 11/22/14 0745 11/23/14 0604 11/24/14 0711  NA 136 135 135 138 138  K 3.6 3.6 3.3* 3.5 3.3*  CL 101 99 98 105 105  CO2 23 25 26 24 24   GLUCOSE 182* 172* 220* 168* 189*  BUN 9 6 5* 5* <5*  CREATININE 0.79 0.77 0.92 0.71 0.62  CALCIUM 9.5 9.7 9.4 8.6 8.7   Liver Function Tests:  Recent Labs Lab 11/19/14 0810 11/21/14 1639 11/22/14 0745 11/23/14 0604 11/24/14 0711  AST 17 952* 438* 109* 39*  ALT 28 782* 708* 391* 269*  ALKPHOS 71 151* 196* 144* 125*  BILITOT 0.5 2.2* 2.3* 0.7 0.4  PROT 7.5 7.7 7.8 6.3 6.8  ALBUMIN 4.1 3.9 3.9 3.3* 3.3*    Recent Labs Lab 11/21/14 1639 11/22/14 0745  LIPASE 94* 70*   No results for input(s): AMMONIA in the last 168 hours. CBC:  Recent Labs Lab 11/19/14 0810 11/21/14 1639 11/22/14 0745 11/23/14 0604  WBC 8.3 8.4 5.7 6.8  NEUTROABS 6.0 6.8 4.0  --   HGB 12.5 12.7 12.9 11.4*  HCT 38.4 38.7 38.9 35.6*  MCV 82.8 83.2 83.1 84.4  PLT 321 321 307 282   Cardiac Enzymes: No results for input(s): CKTOTAL, CKMB, CKMBINDEX, TROPONINI in the last 168 hours. BNP (last 3 results) No results for input(s): BNP in the last 8760 hours.  ProBNP (last 3 results)  Recent  Labs  03/19/14 1314  PROBNP 61.0    CBG:  Recent Labs Lab 11/23/14 1218 11/23/14 1609 11/23/14 2011 11/23/14 2217 11/24/14 0834  GLUCAP 123* 117* 198* 176* 182*    Recent Results (from the past 240 hour(s))  Clostridium Difficile by PCR     Status: None   Collection Time: 11/23/14 10:16 AM  Result Value Ref Range Status   C difficile by pcr NEGATIVE NEGATIVE Final     Studies: No results found.  Scheduled Meds: . heparin  5,000 Units Subcutaneous 3 times per day  . insulin aspart  0-9 Units Subcutaneous TID WC  . lisinopril  40 mg Oral Daily   Continuous Infusions: . sodium chloride 75 mL/hr at 11/24/14 0650    Time spent: 25  minutes  [facility] [email or web address], password [facility] 11/24/2014, 11:33 AM  LOS: 2 days

## 2014-11-25 ENCOUNTER — Inpatient Hospital Stay (HOSPITAL_COMMUNITY): Payer: 59

## 2014-11-25 ENCOUNTER — Encounter (HOSPITAL_COMMUNITY): Payer: Self-pay | Admitting: Radiology

## 2014-11-25 LAB — LIPID PANEL
CHOL/HDL RATIO: 5.1 ratio
Cholesterol: 190 mg/dL (ref 0–200)
HDL: 37 mg/dL — AB (ref >39)
LDL Cholesterol: 125 mg/dL — ABNORMAL HIGH (ref 0–99)
Triglycerides: 138 mg/dL (ref <150)
VLDL: 28 mg/dL (ref 0–40)

## 2014-11-25 LAB — COMPREHENSIVE METABOLIC PANEL
ALBUMIN: 3.3 g/dL — AB (ref 3.5–5.2)
ALT: 205 U/L — ABNORMAL HIGH (ref 0–35)
AST: 53 U/L — ABNORMAL HIGH (ref 0–37)
Alkaline Phosphatase: 108 U/L (ref 39–117)
Anion gap: 9 (ref 5–15)
BUN: <5 mg/dL — ABNORMAL LOW (ref 6–23)
CHLORIDE: 105 mmol/L (ref 96–112)
CO2: 22 mmol/L (ref 19–32)
Calcium: 8.8 mg/dL (ref 8.4–10.5)
Creatinine, Ser: 0.62 mg/dL (ref 0.50–1.10)
GFR calc Af Amer: >90 mL/min (ref >90)
Glucose, Bld: 214 mg/dL — ABNORMAL HIGH (ref 70–99)
POTASSIUM: 3.5 mmol/L (ref 3.5–5.1)
Sodium: 136 mmol/L (ref 135–145)
Total Bilirubin: 0.5 mg/dL (ref 0.3–1.2)
Total Protein: 6.7 g/dL (ref 6.0–8.3)

## 2014-11-25 LAB — GLUCOSE, CAPILLARY
GLUCOSE-CAPILLARY: 197 mg/dL — AB (ref 70–99)
GLUCOSE-CAPILLARY: 212 mg/dL — AB (ref 70–99)
GLUCOSE-CAPILLARY: 190 mg/dL — AB (ref 70–99)
GLUCOSE-CAPILLARY: 152 mg/dL — AB (ref 70–99)

## 2014-11-25 LAB — LIPASE, BLOOD: Lipase: 33 U/L (ref 11–59)

## 2014-11-25 MED ORDER — POTASSIUM CHLORIDE CRYS ER 20 MEQ PO TBCR
40.0000 meq | EXTENDED_RELEASE_TABLET | Freq: Once | ORAL | Status: AC
  Administered 2014-11-25: 40 meq via ORAL
  Filled 2014-11-25: qty 2

## 2014-11-25 MED ORDER — INSULIN ASPART 100 UNIT/ML ~~LOC~~ SOLN
0.0000 [IU] | Freq: Every day | SUBCUTANEOUS | Status: DC
Start: 2014-11-25 — End: 2014-11-27
  Administered 2014-11-26: 3 [IU] via SUBCUTANEOUS

## 2014-11-25 MED ORDER — METOCLOPRAMIDE HCL 5 MG/ML IJ SOLN
10.0000 mg | Freq: Once | INTRAMUSCULAR | Status: AC
  Administered 2014-11-25: 10 mg via INTRAVENOUS
  Filled 2014-11-25: qty 2

## 2014-11-25 MED ORDER — IOHEXOL 300 MG/ML  SOLN
100.0000 mL | Freq: Once | INTRAMUSCULAR | Status: AC | PRN
  Administered 2014-11-25: 100 mL via INTRAVENOUS

## 2014-11-25 MED ORDER — IOHEXOL 300 MG/ML  SOLN
25.0000 mL | INTRAMUSCULAR | Status: AC
  Administered 2014-11-25 (×2): 25 mL via ORAL

## 2014-11-25 MED ORDER — KETOROLAC TROMETHAMINE 30 MG/ML IJ SOLN
30.0000 mg | Freq: Once | INTRAMUSCULAR | Status: AC
  Administered 2014-11-25: 30 mg via INTRAVENOUS
  Filled 2014-11-25: qty 1

## 2014-11-25 MED ORDER — INSULIN ASPART 100 UNIT/ML ~~LOC~~ SOLN
0.0000 [IU] | Freq: Three times a day (TID) | SUBCUTANEOUS | Status: DC
  Administered 2014-11-25: 5 [IU] via SUBCUTANEOUS
  Administered 2014-11-25 – 2014-11-26 (×2): 3 [IU] via SUBCUTANEOUS
  Administered 2014-11-26 – 2014-11-27 (×2): 5 [IU] via SUBCUTANEOUS

## 2014-11-25 MED ORDER — DIPHENHYDRAMINE HCL 50 MG/ML IJ SOLN
25.0000 mg | Freq: Once | INTRAMUSCULAR | Status: AC
  Administered 2014-11-25: 25 mg via INTRAVENOUS
  Filled 2014-11-25: qty 1

## 2014-11-25 MED ORDER — HYDROCHLOROTHIAZIDE 25 MG PO TABS
25.0000 mg | ORAL_TABLET | Freq: Every day | ORAL | Status: DC
  Administered 2014-11-25 – 2014-11-27 (×3): 25 mg via ORAL
  Filled 2014-11-25 (×3): qty 1

## 2014-11-25 NOTE — Progress Notes (Signed)
[facility] PROGRESS NOTE  Rachel Dawson HOZ:[id number] DOB: [birth year] DOA: 11/22/2014 PCP: Donnie Coffin, MD  Summary 34 y.o aa female recently started on [place]. Has had worsening n/v/d since then. Also started on antibiotics. PTA after ED visit. Returned with worse pain. Found to have new increased LFTs and lipase  Assessment/Plan:  Principal Problem:   Transaminitis: acute hep panel negative. Decreasing. [place] just shows fatty liver.  Active Problems:   Microcytic anemia   Hypertension:  resumed ACE I. bp still high. Will resume thiazide   Diabetes mellitus type 2, uncontrolled: SSI  for now. hgb a1c 9. Would NOT resume victoza at discharge, likely needs higher dose amaryl and continue metformin once gi symptoms resolved   Moderate dehydration: resolved   Nausea vomiting and diarrhea:  c diff neg. GI pathogen panel pending. Likely at least in part due to [place]. Tolerating solids, but still with left sided abdominal pain. Will check CT abd/pelvis r/o colitis or other. No f/c or leukocytosis, however   Pancreatitis, acute: likely related to victoza. Can cause. Has LUQ tenderness and describes pain consistent with same. Lipase now normal. Triglycerides ok   Trichomonas vaginalis infection: eventual treatment of pt and partner when more stable   Obesity   Steatohepatitis, non-alcoholic Hypokalemia: replete  Not stable for discharge today. Possibly home tomorrow if CT ok  HPI/Subjective: tol diet, but had an episode of severe LLQ pain and nausea this am. Now improved but "sore"  Objective: Filed Vitals:   11/25/14 0837  BP: 158/80  Pulse:   Temp:   Resp:     Intake/Output Summary (Last 24 hours) at 11/25/14 1040 Last data filed at 11/25/14 0942  Gross per 24 hour  Intake    990 ml  Output    800 ml  Net    190 ml   Filed Weights   11/22/14 0741 11/22/14 1533  Weight: 129.275 kg (285 lb) 138.9 kg (306 lb 3.5 oz)    Exam:   General:  Asleep.  arousable  Cardiovascular: RRR without MGR  Respiratory: CTA without WRR  Abdomen: S, obese. Mild left sided abd pain  Ext: no CCE  Basic Metabolic Panel:  Recent Labs Lab 11/21/14 1639 11/22/14 0745 11/23/14 0604 11/24/14 0711 11/25/14 0619  NA 135 135 138 138 136  K 3.6 3.3* 3.5 3.3* 3.5  CL 99 98 105 105 105  CO2 25 26 24 24 22   GLUCOSE 172* 220* 168* 189* 214*  BUN 6 5* 5* <5* <5*  CREATININE 0.77 0.92 0.71 0.62 0.62  CALCIUM 9.7 9.4 8.6 8.7 8.8   Liver Function Tests:  Recent Labs Lab 11/21/14 1639 11/22/14 0745 11/23/14 0604 11/24/14 0711 11/25/14 0619  AST 952* 438* 109* 39* 53*  ALT 782* 708* 391* 269* 205*  ALKPHOS 151* 196* 144* 125* 108  BILITOT 2.2* 2.3* 0.7 0.4 0.5  PROT 7.7 7.8 6.3 6.8 6.7  ALBUMIN 3.9 3.9 3.3* 3.3* 3.3*    Recent Labs Lab 11/21/14 1639 11/22/14 0745 11/25/14 0619  LIPASE 94* 70* 33   No results for input(s): AMMONIA in the last 168 hours. CBC:  Recent Labs Lab 11/19/14 0810 11/21/14 1639 11/22/14 0745 11/23/14 0604  WBC 8.3 8.4 5.7 6.8  NEUTROABS 6.0 6.8 4.0  --   HGB 12.5 12.7 12.9 11.4*  HCT 38.4 38.7 38.9 35.6*  MCV 82.8 83.2 83.1 84.4  PLT 321 321 307 282   Cardiac Enzymes: No results for input(s): CKTOTAL, CKMB, CKMBINDEX, TROPONINI in the last 168  hours. BNP (last 3 results) No results for input(s): BNP in the last 8760 hours.  ProBNP (last 3 results)  Recent Labs  03/19/14 1314  PROBNP 61.0    CBG:  Recent Labs Lab 11/24/14 0834 11/24/14 1159 11/24/14 1734 11/24/14 2201 11/25/14 0806  GLUCAP 182* 215* 208* 171* 197*    Recent Results (from the past 240 hour(s))  Clostridium Difficile by PCR     Status: None   Collection Time: 11/23/14 10:16 AM  Result Value Ref Range Status   C difficile by pcr NEGATIVE NEGATIVE Final     Studies: No results found.  Scheduled Meds: . heparin  5,000 Units Subcutaneous 3 times per day  . insulin aspart  0-9 Units Subcutaneous TID WC  .  lisinopril  40 mg Oral Daily  . sodium chloride  3 mL Intravenous Q12H   Continuous Infusions:    Time spent: 25 minutes  [facility] [email or web address], password [facility] 11/25/2014, 10:40 AM  LOS: 3 days

## 2014-11-26 ENCOUNTER — Inpatient Hospital Stay (HOSPITAL_COMMUNITY): Payer: 59

## 2014-11-26 LAB — COMPREHENSIVE METABOLIC PANEL
ALK PHOS: 129 U/L — AB (ref 39–117)
ALT: 273 U/L — AB (ref 0–35)
ANION GAP: 13 (ref 5–15)
AST: 154 U/L — AB (ref 0–37)
Albumin: 3.9 g/dL (ref 3.5–5.2)
BUN: <5 mg/dL — ABNORMAL LOW (ref 6–23)
CO2: 23 mmol/L (ref 19–32)
Calcium: 9.5 mg/dL (ref 8.4–10.5)
Chloride: 100 mmol/L (ref 96–112)
Creatinine, Ser: 0.73 mg/dL (ref 0.50–1.10)
GFR calc Af Amer: >90 mL/min (ref >90)
GFR calc non Af Amer: >90 mL/min (ref >90)
GLUCOSE: 227 mg/dL — AB (ref 70–99)
POTASSIUM: 3.7 mmol/L (ref 3.5–5.1)
SODIUM: 136 mmol/L (ref 135–145)
TOTAL PROTEIN: 7.5 g/dL (ref 6.0–8.3)
Total Bilirubin: 0.6 mg/dL (ref 0.3–1.2)

## 2014-11-26 LAB — GLUCOSE, CAPILLARY
Glucose-Capillary: 200 mg/dL — ABNORMAL HIGH (ref 70–99)
Glucose-Capillary: 211 mg/dL — ABNORMAL HIGH (ref 70–99)
Glucose-Capillary: 253 mg/dL — ABNORMAL HIGH (ref 70–99)

## 2014-11-26 MED ORDER — SINCALIDE 5 MCG IJ SOLR
0.0200 ug/kg | Freq: Once | INTRAMUSCULAR | Status: AC
  Administered 2014-11-26: 2.8 ug via INTRAVENOUS
  Filled 2014-11-26: qty 5

## 2014-11-26 MED ORDER — HYDRALAZINE HCL 25 MG PO TABS
25.0000 mg | ORAL_TABLET | Freq: Three times a day (TID) | ORAL | Status: DC
  Administered 2014-11-26 – 2014-11-27 (×4): 25 mg via ORAL
  Filled 2014-11-26 (×7): qty 1

## 2014-11-26 MED ORDER — METFORMIN HCL 500 MG PO TABS
500.0000 mg | ORAL_TABLET | Freq: Two times a day (BID) | ORAL | Status: DC
  Filled 2014-11-26 (×2): qty 1

## 2014-11-26 MED ORDER — TRAMADOL HCL 50 MG PO TABS
50.0000 mg | ORAL_TABLET | Freq: Four times a day (QID) | ORAL | Status: DC | PRN
  Administered 2014-11-26: 50 mg via ORAL
  Filled 2014-11-26: qty 1

## 2014-11-26 MED ORDER — STERILE WATER FOR INJECTION IJ SOLN
INTRAMUSCULAR | Status: AC
Start: 2014-11-26 — End: 2014-11-27
  Filled 2014-11-26: qty 10

## 2014-11-26 MED ORDER — TRAMADOL HCL 50 MG PO TABS: 50.0000 mg | ORAL_TABLET | Freq: Four times a day (QID) | ORAL | Status: DC | PRN

## 2014-11-26 MED ORDER — GLIMEPIRIDE 2 MG PO TABS
2.0000 mg | ORAL_TABLET | Freq: Every day | ORAL | Status: DC
  Filled 2014-11-26: qty 1

## 2014-11-26 MED ORDER — HYDRALAZINE HCL 20 MG/ML IJ SOLN
10.0000 mg | Freq: Once | INTRAMUSCULAR | Status: DC
  Filled 2014-11-26: qty 0.5

## 2014-11-26 MED ORDER — GLIMEPIRIDE 4 MG PO TABS
4.0000 mg | ORAL_TABLET | Freq: Every day | ORAL | Status: DC
  Administered 2014-11-27: 4 mg via ORAL
  Filled 2014-11-26 (×2): qty 1

## 2014-11-26 MED ORDER — SINCALIDE 5 MCG IJ SOLR
INTRAMUSCULAR | Status: AC
  Administered 2014-11-26: 2.8 ug via INTRAVENOUS
  Filled 2014-11-26: qty 10

## 2014-11-26 MED ORDER — TECHNETIUM TC 99M MEBROFENIN IV KIT
5.0000 | PACK | Freq: Once | INTRAVENOUS | Status: AC | PRN
  Administered 2014-11-26: 5 via INTRAVENOUS

## 2014-11-26 NOTE — Progress Notes (Signed)
Patient with complaints of severe headache this shift, Dr. Hilbert Bible notified and aware, patient not due for pain medication at this time, IV toradol, benadryl, and reglan ordered and administered with good effect, will continue to monitor.

## 2014-11-26 NOTE — Progress Notes (Signed)
Patient BP around 2200 196/92, 10 mg prn IV Labetalol administered with BP 103/47 when rechecked, patient with no cardiac complaints, asymptomatic at this time, will continue to monitor.

## 2014-11-26 NOTE — Progress Notes (Signed)
Inpatient Diabetes Program Recommendations  AACE/ADA: New Consensus Statement on Inpatient Glycemic Control (2013)  Target Ranges:  Prepandial:   less than 140 mg/dL      Peak postprandial:   less than 180 mg/dL (1-2 hours)      Critically ill patients:  140 - 180 mg/dL    Inpatient Diabetes Program Recommendations Insulin - Basal: Fasting glucose levels are high as well as cbg's throughout the day. HS glucose last niigt lower than fasting this am. Please add some basal lantus to regimen. Using weight based minimal starting dose of 0.2 units/kg, pt would requires 27 units, however could start with 20 units daily of HS. HgbA1C at 9.1%.  Thank you Rosita Kea, RN, MSN, CDE  Diabetes Inpatient Program Office: [phone] Pager: [phone] 8:00 am to 5:00 pm

## 2014-11-26 NOTE — Progress Notes (Addendum)
[facility] PROGRESS NOTE  Kamoria Lucien POE:[id number] DOB: [birth year] DOA: 11/22/2014 PCP: Donnie Coffin, MD  Summary 34 y.o aa female recently started on [place]. Has had worsening n/v/d since then. Also started on antibiotics. PTA after ED visit. Returned with worse pain. Found to have new increased LFTs and lipase  Assessment/Plan:    Transaminitis: acute hep panel negative. Decreasing. [place] just shows fatty liver.  -CT ok -HIDA scan    Microcytic anemia   Hypertension:  Resume home meds and add ace   Diabetes mellitus type 2, uncontrolled: SSI  for now. hgb a1c 9. Would NOT resume victoza at discharge, likely needs higher dose amaryl and continue metformin once gi symptoms resolved   Moderate dehydration: resolved   Nausea vomiting and diarrhea:  c diff neg. GI pathogen panel pending. Likely at least in part due to [place]. Tolerating solids  Pancreatitis, acute: likely related to victoza. Can cause. C/o epigastric pain. Lipase now normal. Triglycerides ok   Trichomonas vaginalis infection: eventual treatment of pt and partner when more stable   Obesity   Steatohepatitis, non-alcoholic Hypokalemia: replete    HPI/Subjective: C/o epigastric pain  Objective: Filed Vitals:   11/26/14 0457  BP: 162/69  Pulse:   Temp: 98.6 F (37 C)  Resp: 18    Intake/Output Summary (Last 24 hours) at 11/26/14 1020 Last data filed at 11/26/14 0310  Gross per 24 hour  Intake    700 ml  Output    950 ml  Net   -250 ml   Filed Weights   11/22/14 0741 11/22/14 1533  Weight: 129.275 kg (285 lb) 138.9 kg (306 lb 3.5 oz)    Exam:   General:  In chair  Cardiovascular: RRR without MGR  Respiratory: CTA without WRR  Abdomen: S, obese. Epigastric discomfort  Ext: no CCE  Basic Metabolic Panel:  Recent Labs Lab 11/22/14 0745 11/23/14 0604 11/24/14 0711 11/25/14 0619 11/26/14 0734  NA 135 138 138 136 136  K 3.3* 3.5 3.3* 3.5 3.7  CL 98 105 105 105 100  CO2 26 24  24 22 23   GLUCOSE 220* 168* 189* 214* 227*  BUN 5* 5* <5* <5* <5*  CREATININE 0.92 0.71 0.62 0.62 0.73  CALCIUM 9.4 8.6 8.7 8.8 9.5   Liver Function Tests:  Recent Labs Lab 11/22/14 0745 11/23/14 0604 11/24/14 0711 11/25/14 0619 11/26/14 0734  AST 438* 109* 39* 53* 154*  ALT 708* 391* 269* 205* 273*  ALKPHOS 196* 144* 125* 108 129*  BILITOT 2.3* 0.7 0.4 0.5 0.6  PROT 7.8 6.3 6.8 6.7 7.5  ALBUMIN 3.9 3.3* 3.3* 3.3* 3.9    Recent Labs Lab 11/21/14 1639 11/22/14 0745 11/25/14 0619  LIPASE 94* 70* 33   No results for input(s): AMMONIA in the last 168 hours. CBC:  Recent Labs Lab 11/21/14 1639 11/22/14 0745 11/23/14 0604  WBC 8.4 5.7 6.8  NEUTROABS 6.8 4.0  --   HGB 12.7 12.9 11.4*  HCT 38.7 38.9 35.6*  MCV 83.2 83.1 84.4  PLT 321 307 282   Cardiac Enzymes: No results for input(s): CKTOTAL, CKMB, CKMBINDEX, TROPONINI in the last 168 hours. BNP (last 3 results) No results for input(s): BNP in the last 8760 hours.  ProBNP (last 3 results)  Recent Labs  03/19/14 1314  PROBNP 61.0    CBG:  Recent Labs Lab 11/25/14 0806 11/25/14 1141 11/25/14 1720 11/25/14 2141 11/26/14 0753  GLUCAP 197* 212* 190* 152* 200*    Recent Results (from the past 240  hour(s))  Clostridium Difficile by PCR     Status: None   Collection Time: 11/23/14 10:16 AM  Result Value Ref Range Status   C difficile by pcr NEGATIVE NEGATIVE Final     Studies: Ct Abdomen Pelvis W Contrast  11/26/2014   CLINICAL DATA:  Left-sided abdominal pain with elevated LFTs 10 increase lipase  EXAM: CT ABDOMEN AND PELVIS WITH CONTRAST  TECHNIQUE: Multidetector CT imaging of the abdomen and pelvis was performed using the standard protocol following bolus administration of intravenous contrast.  CONTRAST:  13mL OMNIPAQUE IOHEXOL 300 MG/ML  SOLN  COMPARISON:  11/22/2014  FINDINGS: Lung bases are free of acute infiltrate or sizable effusion.  The liver, gallbladder, spleen, adrenal glands and pancreas  are within normal limits with the exception of mild decreased attenuation of the liver consistent with fatty infiltration.  Kidneys are well visualized bilaterally and demonstrate no renal calculi or urinary tract obstructive changes. A 2 cm hypodensity is noted within the anterior aspect of the left kidney incompletely evaluated on this exam. This may simply represent a complicated cyst although further evaluation is recommended. Simple renal ultrasound may be sufficient to allow for diagnosis.  The appendix is within normal limits. The bladder is partially distended. No pelvic mass lesion or sidewall abnormality is noted. Very minimal diverticular change is noted without diverticulitis. No sidewall abnormality is noted. No significant lymphadenopathy is seen. A left retro aortic renal vein is noted. Screw placement is noted in the proximal femurs bilaterally.  IMPRESSION: Hypodensity within the left kidney as described. This may simply represent a cyst and renal ultrasound is recommended for further characterization.  No other focal abnormality is seen.   Electronically Signed   By: Inez Catalina M.D.   On: 11/26/2014 07:28    Scheduled Meds: . [START ON 11/27/2014] glimepiride  2 mg Oral Q breakfast  . heparin  5,000 Units Subcutaneous 3 times per day  . hydrALAZINE  25 mg Oral 3 times per day  . hydrochlorothiazide  25 mg Oral Daily  . insulin aspart  0-15 Units Subcutaneous TID WC  . insulin aspart  0-5 Units Subcutaneous QHS  . lisinopril  40 mg Oral Daily  . metFORMIN  500 mg Oral BID WC  . sodium chloride  3 mL Intravenous Q12H   Continuous Infusions:    Time spent: 25 minutes  Mathea Frieling, Aaralyn  [facility] [email or web address], password [email or web address] 11/26/2014, 10:20 AM  LOS: 4 days

## 2014-11-27 DIAGNOSIS — E860 Dehydration: Secondary | ICD-10-CM

## 2014-11-27 LAB — COMPREHENSIVE METABOLIC PANEL
ALK PHOS: 113 U/L (ref 39–117)
ALT: 243 U/L — AB (ref 0–35)
AST: 114 U/L — AB (ref 0–37)
Albumin: 3.6 g/dL (ref 3.5–5.2)
Anion gap: 9 (ref 5–15)
BUN: 7 mg/dL (ref 6–23)
CO2: 27 mmol/L (ref 19–32)
Calcium: 9.5 mg/dL (ref 8.4–10.5)
Chloride: 100 mmol/L (ref 96–112)
Creatinine, Ser: 0.75 mg/dL (ref 0.50–1.10)
GFR calc Af Amer: >90 mL/min (ref >90)
GFR calc non Af Amer: >90 mL/min (ref >90)
Glucose, Bld: 198 mg/dL — ABNORMAL HIGH (ref 70–99)
Potassium: 3.9 mmol/L (ref 3.5–5.1)
Sodium: 136 mmol/L (ref 135–145)
TOTAL PROTEIN: 6.9 g/dL (ref 6.0–8.3)
Total Bilirubin: 1.2 mg/dL (ref 0.3–1.2)

## 2014-11-27 LAB — CBC
HEMATOCRIT: 35.6 % — AB (ref 36.0–46.0)
HEMOGLOBIN: 11.4 g/dL — AB (ref 12.0–15.0)
MCH: 26.6 pg (ref 26.0–34.0)
MCHC: 32 g/dL (ref 30.0–36.0)
MCV: 83 fL (ref 78.0–100.0)
Platelets: 278 10*3/uL (ref 150–400)
RBC: 4.29 MIL/uL (ref 3.87–5.11)
RDW: 14.2 % (ref 11.5–15.5)
WBC: 6.6 10*3/uL (ref 4.0–10.5)

## 2014-11-27 LAB — GI PATHOGEN PANEL BY PCR, STOOL
C difficile toxin A/B: NOT DETECTED
Campylobacter by PCR: NOT DETECTED
Cryptosporidium by PCR: NOT DETECTED
E coli (ETEC) LT/ST: NOT DETECTED
E coli (STEC): NOT DETECTED
E coli 0157 by PCR: NOT DETECTED
G LAMBLIA BY PCR: NOT DETECTED
Norovirus GI/GII: NOT DETECTED
ROTAVIRUS A BY PCR: NOT DETECTED
SALMONELLA BY PCR: NOT DETECTED
SHIGELLA BY PCR: NOT DETECTED

## 2014-11-27 LAB — GLUCOSE, CAPILLARY: Glucose-Capillary: 217 mg/dL — ABNORMAL HIGH (ref 70–99)

## 2014-11-27 MED ORDER — GLIMEPIRIDE 4 MG PO TABS: 4.0000 mg | ORAL_TABLET | Freq: Every day | ORAL | Status: DC

## 2014-11-27 MED ORDER — INSULIN GLARGINE 100 UNIT/ML ~~LOC~~ SOLN: 10.0000 [IU] | Freq: Every day | SUBCUTANEOUS | Status: DC

## 2014-11-27 MED ORDER — INSULIN GLARGINE 100 UNIT/ML ~~LOC~~ SOLN
15.0000 [IU] | Freq: Every day | SUBCUTANEOUS | Status: DC
  Administered 2014-11-27: 15 [IU] via SUBCUTANEOUS
  Filled 2014-11-27: qty 0.15

## 2014-11-27 MED ORDER — PROMETHAZINE HCL 12.5 MG PO TABS: 12.5000 mg | ORAL_TABLET | Freq: Four times a day (QID) | ORAL | Status: DC | PRN

## 2014-11-27 MED ORDER — HYDRALAZINE HCL 25 MG PO TABS: 25.0000 mg | ORAL_TABLET | Freq: Three times a day (TID) | ORAL | Status: DC

## 2014-11-27 MED ORDER — TRAMADOL HCL 50 MG PO TABS: 50.0000 mg | ORAL_TABLET | Freq: Four times a day (QID) | ORAL | Status: DC | PRN

## 2014-11-27 NOTE — Progress Notes (Signed)
  Pharmacy Discharge Medication Therapy Review   Total Number of meds on admission: 8 (polypharmacy > 10 meds)  Indications for all medications: [x]  Yes       []  No  Adherence Review  []  Excellent (no doses missed/week)     [x]  Good (no more than 1 dose missed/week)     []  Partial (2-3 doses missed/week)     []  Poor (>3 doses missed/week)  Total number of high risk medications: 3 (Anticoagulants, Dual antiplatelets, oral Antihyperglycemic agents, Insulins, Antipsychotics, Anti-Seizure meds, Inhalers, HF/ACS meds, Antibiotics and HIV medications)   Assessment: (Medication related problems)  Intervention  YES NO  Explanation   Indications      Medication without noted indication []  [x]     Indication without noted medication []  [x]     Duplicate therapy [x]  []   Pt had Rx for ondansetron PTA, now new Rx for promethazine.  Will discuss with patient.  Efficacy      Suboptimal drug or dose selection [x]  []  New Rx for hydralazine 25mg  TID.  May consider once daily amlodipine or spironolactone for pt convenience and improved adherence.  Insufficient dose/duration [x]  []  Pt w/ uncontrolled DM - Hgb A1c of 9.1%.  On metformin 500mg  BID PTA.  Recommend titrating up to 1000mg  BID as tolerated - may require slow titration and switch to XR formulation if pt continues to experience N/V/D.  Failure to receive therapy  (Rx not filled) []  [x]     Safety      Adverse drug event [x]  []   Pt has been experiencing N/V/D.  Metformin may also be contributing to symptoms. May consider switch to XR formulation if symptoms persist after discharge.  Pt admitted w/ elevated LFTs and acetaminophen on PTA and discharge med list.  PCP may consider discontinuation of acetaminophen if LFTs remain elevated.  Will discuss w/ patient.   Drug interaction []  [x]     Excessive dose/duration []  [x]    High-risk medications []  [x]    Compliance     Underuse []  [x]     Overuse []  [x]    Other pertinent pharmacist counseling [x]   []   I counseled pt on new start Lantus.  Pt indicates that she is comfortable w/ administration using pen injections.  She was previously on Victoza.    Total number of new medications upon discharge: 3  Time:  Time spent preparing for discharge counseling: 45 min Time spent counseling patient: 10 min   PLAN:  Consider the following at discharge/Recommendations discussed with patient: - DC acetaminophen if LFTs remain elevated after discharge - DC either ondansetron or promethazine - Change hydralazine TID to daily amlodipine or spironolactone for better adherence - Change metformin to XR formulation if N/V/D persist and titrate up as tolerated  Drucie Opitz, PharmD Clinical Pharmacy Resident Pager: [phone]

## 2014-11-27 NOTE — Discharge Summary (Signed)
Physician Discharge Summary  Dimitri Dsouza ZOX:[id number] DOB: 03-13-1981 DOA: 11/22/2014  PCP: Donnie Coffin, MD  Admit date: 11/22/2014 Discharge date: 11/27/2014  Time spent: 35 minutes  Recommendations for Outpatient Follow-up:  1. CMP 1 week 2. Once blood sugars controlled, if nausea continues would get gastroparesis study 3. Patient to bring in blood sugar log 4. Referral to [place] [facility] for GB follow up 5. Renal U/S to follow up CT finding  Discharge Diagnoses:  Principal Problem:   Transaminitis Active Problems:   Microcytic anemia   Hypertension   Diabetes mellitus type 2, uncontrolled   Moderate dehydration   Nausea vomiting and diarrhea   Pancreatitis, acute   Trichomonas vaginalis infection   Obesity   Steatohepatitis, non-alcoholic   Essential hypertension, benign   Discharge Condition: improved  Diet recommendation: low fat diabetic  Filed Weights   11/22/14 0741 11/22/14 1533  Weight: 129.275 kg (285 lb) 138.9 kg (306 lb 3.5 oz)    History of present illness:  34 year old female patient with past medical history of obesity, hypertension and diabetes mellitus. She initially presented to the ER on 4/11 with nausea, vomiting and diarrhea. During that evaluation her laboratory data was unremarkable and she had a benign abdominal examination. She was given IV fluids and discharged home. Patient returns to the ER today with persistence of symptoms. In discussing with the patient she began having gastrointestinal symptoms one month prior; primarily diarrhea without abdominal pain and this correlated with initiation of Victoza therapy. Her dose was increased slowly in a stepwise fashion per her primary care physician and this is when she noticed progression of her GI symptoms (which later became associated with nausea and vomiting and burning epigastric and left upper quadrant abdominal pain). The diarrhea also worsened. Beginning this past Sunday her symptoms  seem to have worsened and were associated with subjective fevers and chills. In the past 24 hours she had noticed her urine had also become more dark. She reports over the past 48 hours she's had at least 3-4 emesis episodes per day but is having more frequent watery diarrheal stools (at least 5-6 per day) and in the past 24 hours greater than 10 per day. She has not noticed any frank blood or melena with either emesis or diarrhea but did notice some specks of blood recently with emesis. Initially her abdominal discomfort was primarily a burning epigastric pain that eventually began radiating to the left upper quadrant and in the past 24 hours has also radiated to the right upper quadrant. She describes this pain as burning, sharp and twisting in nature and has become near constant. Patient is also on metformin but has been on this medication since September. She did have issues related to diarrhea which improved after adjusting to nocturnal dosing several months ago. During her initial ER evaluation on 4/11 her urinalysis was positive for Trichomonas and because of persistent GI symptoms her primary care physician started her on oral Flagyl on 4/13. Patient has only taken 1 dose of Tylenol for headache this morning. She does have a history of remote blood transfusions with surgical procedures.  In the ER she was afebrile and hemodynamically stable and not hypoxemic. She is being given a liter of IV fluids by the ER physician. In review of laboratory data appears the patient had outpatient labs drawn on 4/13 which revealed alkaline phosphatase 151, lipase 94, AST 952, ALT 782 and total bilirubin 2.2. CBC was normal except for mildly elevated neutrophils at 81%. Today  in the ER she continues with transaminitis with alkaline phosphatase 196, lipase 70, AST down to 438, and ALT down to 708 with total bilirubin-year-old the same at 2.3. Again CBC is within normal limits. Urinalysis demonstrated cloudy urine with an  elevated urine specific gravity 1.028. She also had significant glycosuria with greater than 1000 glucose, moderate bilirubin, 40 ketones and a small amount leukocytes. An abdominal ultrasound was completed that revealed hepatic steatosis that no evidence of gallstones or acute cholecystitis.  Hospital Course:  Transaminitis: acute hep panel negative. Decreasing. [place] just shows fatty liver.  -CT ok -HIDA scan- outpatient surgery follow up   Microcytic anemia  Hypertension: Resume home meds and add  Diabetes mellitus type 2, uncontrolled: SSI for now. hgb a1c 9. Would NOT resume victoza at discharge, higher dose amaryl, add lantus for now and continue metformin once gi symptoms resolved  Moderate dehydration: resolved  Nausea vomiting and diarrhea: c diff neg. GI pathogen panel pending. Likely at least in part due to [place]. Tolerating solids Pancreatitis, acute: likely related to victoza. Can cause. C/o epigastric pain. Lipase now normal. Triglycerides ok  Trichomonas vaginalis infection: eventual treatment of pt and partner when more stable  Obesity  Steatohepatitis, non-alcoholic Hypokalemia: replete  Procedures:  HIDA  Consultations:    Discharge Exam: Filed Vitals:   11/27/14 0509  BP: 128/69  Pulse: 77  Temp: 97.8 F (36.6 C)  Resp: 18      Discharge Instructions   Discharge Instructions    Diet - low sodium heart healthy    Complete by:  As directed      Diet Carb Modified    Complete by:  As directed      Discharge instructions    Complete by:  As directed   Check blood sugars BID (once in the AM and once 1 hour after a meal -bring log to PCP for adjustments -if blood sugars go < 100 in AM- stop lantus     Increase activity slowly    Complete by:  As directed           Current Discharge Medication List    START taking these medications   Details  hydrALAZINE (APRESOLINE) 25 MG tablet Take 1 tablet (25 mg total) by mouth every 8 (eight)  hours. Qty: 90 tablet, Refills: 0    insulin glargine (LANTUS) 100 UNIT/ML injection Inject 0.1 mLs (10 Units total) into the skin at bedtime. Qty: 10 mL, Refills: 11    promethazine (PHENERGAN) 12.5 MG tablet Take 1 tablet (12.5 mg total) by mouth every 6 (six) hours as needed for nausea or vomiting. Qty: 30 tablet, Refills: 0      CONTINUE these medications which have CHANGED   Details  glimepiride (AMARYL) 4 MG tablet Take 1 tablet (4 mg total) by mouth daily with breakfast. Qty: 30 tablet, Refills: 0      CONTINUE these medications which have NOT CHANGED   Details  acetaminophen (TYLENOL) 325 MG tablet Take 650 mg by mouth every 6 (six) hours as needed for mild pain, moderate pain, fever or headache.    hydrochlorothiazide (HYDRODIURIL) 25 MG tablet Take 1 tablet (25 mg total) by mouth daily. Qty: 30 tablet, Refills: 0    lisinopril (PRINIVIL,ZESTRIL) 40 MG tablet Take 40 mg by mouth daily.    ondansetron (ZOFRAN) 4 MG tablet Take 1 tablet (4 mg total) by mouth every 6 (six) hours. Qty: 12 tablet, Refills: 0    traMADol (ULTRAM) 50 MG tablet  Take 1 tablet (50 mg total) by mouth every 6 (six) hours as needed. Qty: 10 tablet, Refills: 0    metFORMIN (GLUCOPHAGE) 500 MG tablet Take 500 mg by mouth 2 (two) times daily with a meal.      STOP taking these medications     ciprofloxacin (CIPRO) 500 MG tablet      metroNIDAZOLE (FLAGYL) 500 MG tablet      ibuprofen (ADVIL,MOTRIN) 800 MG tablet      Liraglutide 18 MG/3ML SOPN        No Known Allergies    The results of significant diagnostics from this hospitalization (including imaging, microbiology, ancillary and laboratory) are listed below for reference.    Significant Diagnostic Studies: Nm Hepatobiliary Liver Func  11/26/2014   CLINICAL DATA:  34 year old female with abdominal pain, nausea, vomiting for 1 week.  EXAM: NUCLEAR MEDICINE HEPATOBILIARY IMAGING WITH GALLBLADDER EF  TECHNIQUE: Sequential images of the  abdomen were obtained out to 60 minutes following intravenous administration of radiopharmaceutical. After slow intravenous infusion of 2.8 micrograms Cholecystokinin, gallbladder ejection fraction was determined.  RADIOPHARMACEUTICALS:  5.0 [employer] HC-62B Choletec  COMPARISON:  11/25/2014 CT and 11/22/2014 ultrasound  FINDINGS: Prompt homogeneous hepatic activity is identified.  Gallbladder activity is identified at 9 minutes.  Small bowel activity is identified at 10 minutes.  Following the administration of CCK, maximum gallbladder ejection fraction is calculated at 20% at 45 minutes. At 45 min, normal ejection fraction is greater than 40%.  The patient did not experience symptoms with CCK infusion.  IMPRESSION: Low gallbladder ejection fraction which can be seen with chronic cholecystitis/ biliary dyskinesia.  No evidence of acute cholecystitis/ cystic duct obstruction.   Electronically Signed   By: Margarette Canada M.D.   On: 11/26/2014 18:50   Ct Abdomen Pelvis W Contrast  11/26/2014   CLINICAL DATA:  Left-sided abdominal pain with elevated LFTs 10 increase lipase  EXAM: CT ABDOMEN AND PELVIS WITH CONTRAST  TECHNIQUE: Multidetector CT imaging of the abdomen and pelvis was performed using the standard protocol following bolus administration of intravenous contrast.  CONTRAST:  1103mL OMNIPAQUE IOHEXOL 300 MG/ML  SOLN  COMPARISON:  11/22/2014  FINDINGS: Lung bases are free of acute infiltrate or sizable effusion.  The liver, gallbladder, spleen, adrenal glands and pancreas are within normal limits with the exception of mild decreased attenuation of the liver consistent with fatty infiltration.  Kidneys are well visualized bilaterally and demonstrate no renal calculi or urinary tract obstructive changes. A 2 cm hypodensity is noted within the anterior aspect of the left kidney incompletely evaluated on this exam. This may simply represent a complicated cyst although further evaluation is recommended. Simple renal  ultrasound may be sufficient to allow for diagnosis.  The appendix is within normal limits. The bladder is partially distended. No pelvic mass lesion or sidewall abnormality is noted. Very minimal diverticular change is noted without diverticulitis. No sidewall abnormality is noted. No significant lymphadenopathy is seen. A left retro aortic renal vein is noted. Screw placement is noted in the proximal femurs bilaterally.  IMPRESSION: Hypodensity within the left kidney as described. This may simply represent a cyst and renal ultrasound is recommended for further characterization.  No other focal abnormality is seen.   Electronically Signed   By: Inez Catalina M.D.   On: 11/26/2014 07:28   US Abdomen Limited  11/22/2014   CLINICAL DATA:  Two week history of right upper quadrant pain  EXAM: US ABDOMEN LIMITED - RIGHT UPPER QUADRANT  COMPARISON:  None.  FINDINGS: Gallbladder:  No gallstones or wall thickening visualized. There is no pericholecystic fluid. No sonographic Murphy sign noted.  Common bile duct:  Diameter: 4 mm. There is no intrahepatic or extrahepatic biliary duct dilatation.  Liver:  No focal lesion identified. Liver echogenicity overall is increased.  IMPRESSION: Increased liver echogenicity, a finding most likely due to hepatic steatosis. While no focal liver lesions are identified, it must be cautioned that the sensitivity of ultrasound focal liver lesions is diminished in this circumstance. Study otherwise unremarkable.   Electronically Signed   By: Lowella Grip III M.D.   On: 11/22/2014 09:18    Microbiology: Recent Results (from the past 240 hour(s))  Clostridium Difficile by PCR     Status: None   Collection Time: 11/23/14 10:16 AM  Result Value Ref Range Status   C difficile by pcr NEGATIVE NEGATIVE Final     Labs: Basic Metabolic Panel:  Recent Labs Lab 11/23/14 0604 11/24/14 0711 11/25/14 0619 11/26/14 0734 11/27/14 0721  NA 138 138 136 136 136  K 3.5 3.3* 3.5 3.7  3.9  CL 105 105 105 100 100  CO2 24 24 22 23 27   GLUCOSE 168* 189* 214* 227* 198*  BUN 5* <5* <5* <5* 7  CREATININE 0.71 0.62 0.62 0.73 0.75  CALCIUM 8.6 8.7 8.8 9.5 9.5   Liver Function Tests:  Recent Labs Lab 11/23/14 0604 11/24/14 0711 11/25/14 0619 11/26/14 0734 11/27/14 0721  AST 109* 39* 53* 154* 114*  ALT 391* 269* 205* 273* 243*  ALKPHOS 144* 125* 108 129* 113  BILITOT 0.7 0.4 0.5 0.6 1.2  PROT 6.3 6.8 6.7 7.5 6.9  ALBUMIN 3.3* 3.3* 3.3* 3.9 3.6    Recent Labs Lab 11/21/14 1639 11/22/14 0745 11/25/14 0619  LIPASE 94* 70* 33   No results for input(s): AMMONIA in the last 168 hours. CBC:  Recent Labs Lab 11/21/14 1639 11/22/14 0745 11/23/14 0604 11/27/14 0721  WBC 8.4 5.7 6.8 6.6  NEUTROABS 6.8 4.0  --   --   HGB 12.7 12.9 11.4* 11.4*  HCT 38.7 38.9 35.6* 35.6*  MCV 83.2 83.1 84.4 83.0  PLT 321 307 282 278   Cardiac Enzymes: No results for input(s): CKTOTAL, CKMB, CKMBINDEX, TROPONINI in the last 168 hours. BNP: BNP (last 3 results) No results for input(s): BNP in the last 8760 hours.  ProBNP (last 3 results)  Recent Labs  03/19/14 1314  PROBNP 61.0    CBG:  Recent Labs Lab 11/25/14 2141 11/26/14 0753 11/26/14 1221 11/26/14 2239 11/27/14 0839  GLUCAP 152* 200* 211* 253* 217*       Signed:  Minna Dumire, [place]  [facility] 11/27/2014, 10:51 AM

## 2014-11-27 NOTE — Progress Notes (Addendum)
Patient was discharged home by MD order; discharged instructions  review and give to patient with care notes and prescriptions; IV DIC; skin intact; patient will be escorted to the car by a volunteer via wheelchair.  

## 2015-08-31 IMAGING — CT CT ABD-PELV W/ CM
2 of 4 series · 17 of 46 positions shown, 19 images · IV contrast (Omni 300)
Comparison: 11/22/2014

CLINICAL DATA: Left-sided abdominal pain with elevated LFTs 10
increase lipase

EXAM:
CT ABDOMEN AND PELVIS WITH CONTRAST
TECHNIQUE: Multidetector CT imaging of the abdomen and pelvis was performed
using the standard protocol following bolus administration of
intravenous contrast.
CONTRAST:  100mL OMNIPAQUE IOHEXOL 300 MG/ML  SOLN

[Series 2: abd/ pelvis 5.0 i30f 1 · axial · 0.97mm/px · z∈[+800,+1225]mm · 14 of 93 slices shown, 16 images]
[im 4/93  soft-tissue]
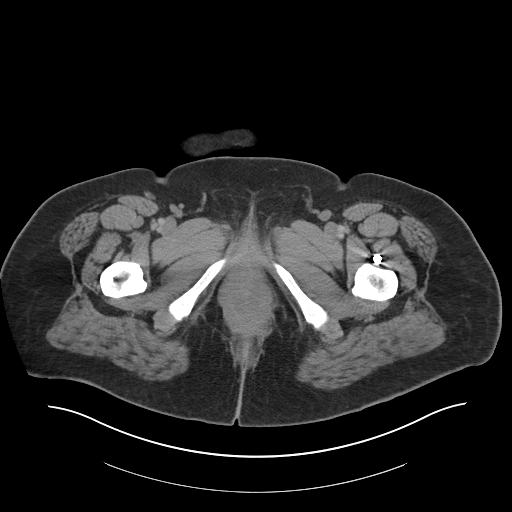
[im 4/93  bone]
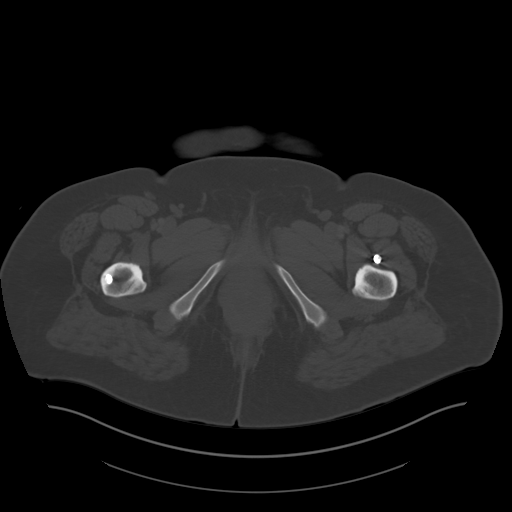
[im 12/93  soft-tissue]
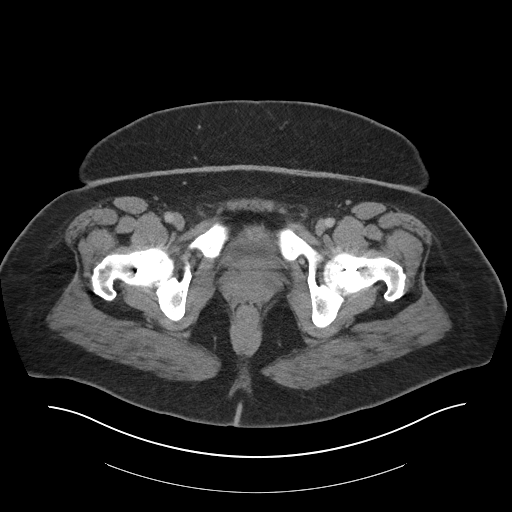
[im 19/93  soft-tissue]
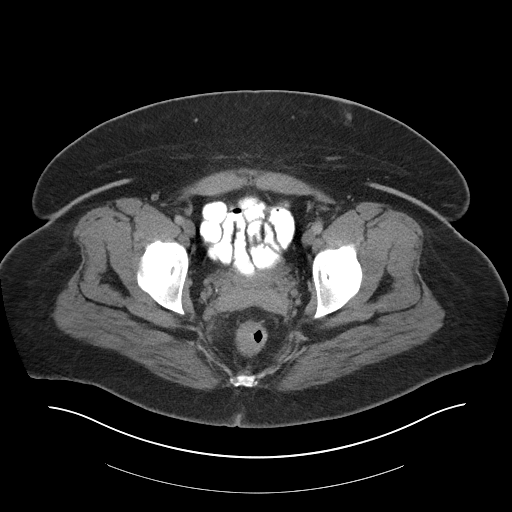
[im 26/93  soft-tissue]
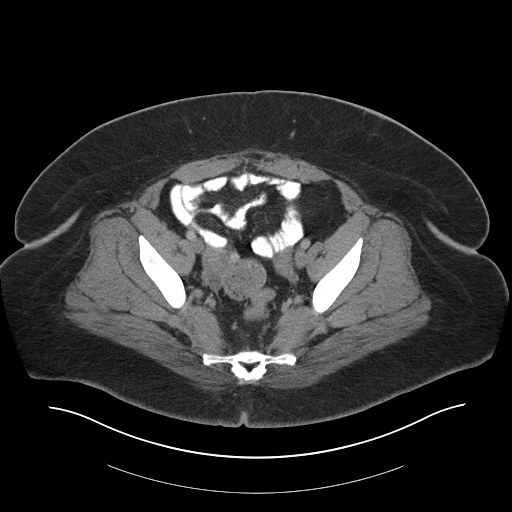
[im 30/93  soft-tissue]
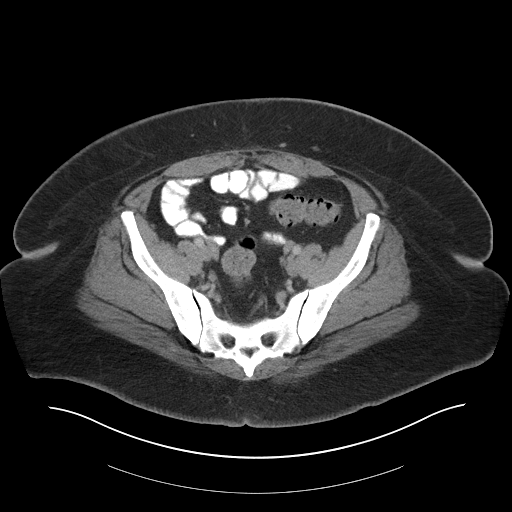
[im 37/93  soft-tissue]
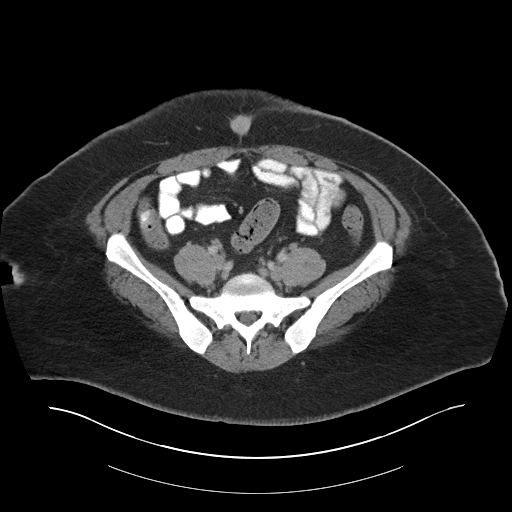
[im 45/93  soft-tissue]
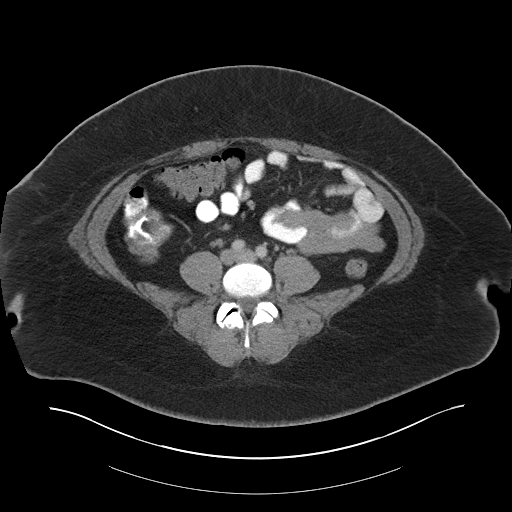
[im 48/93  soft-tissue]
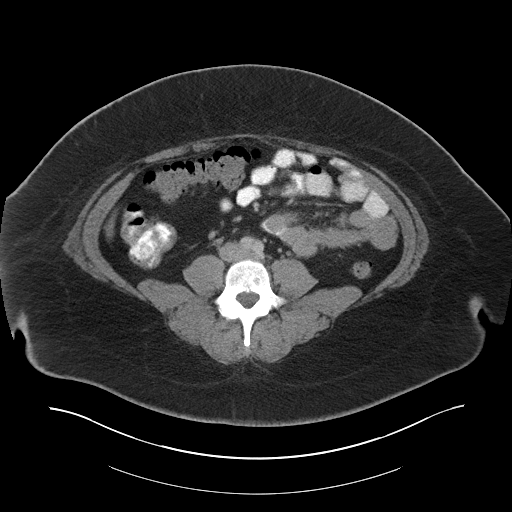
[im 56/93  soft-tissue]
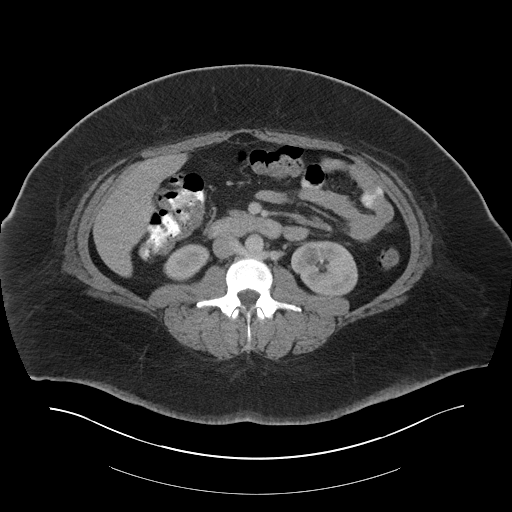
[im 56/93  bone]
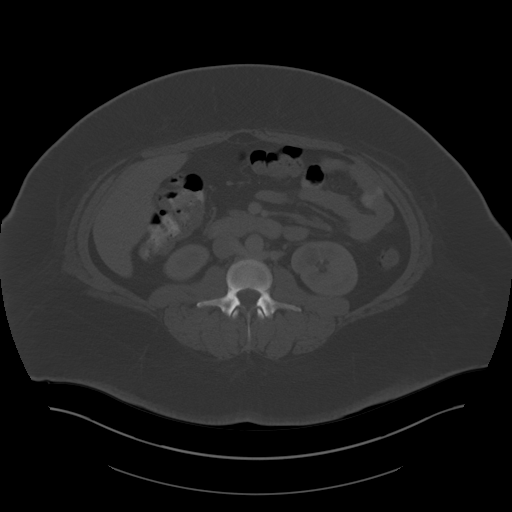
[im 63/93  soft-tissue]
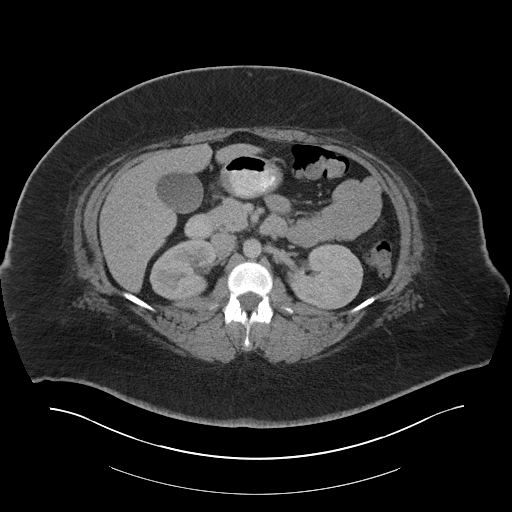
[im 70/93  soft-tissue]
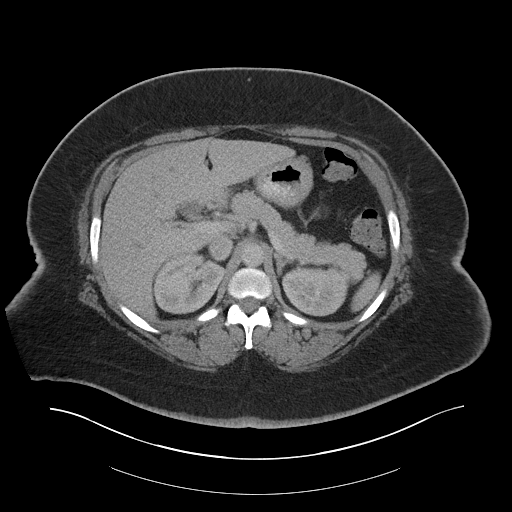
[im 74/93  soft-tissue]
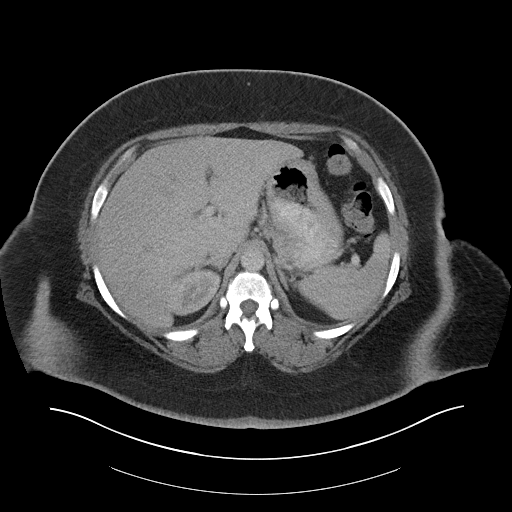
[im 81/93  soft-tissue]
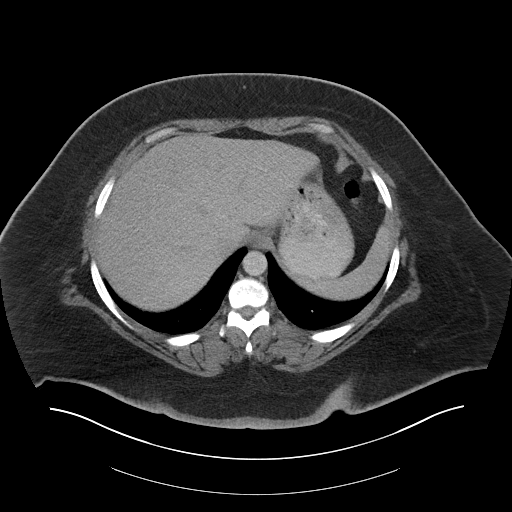
[im 89/93  soft-tissue]
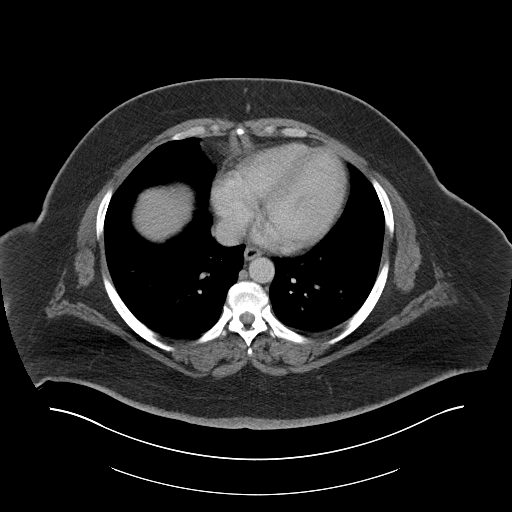

[Series 5: coronals · coronal · 0.99mm/px · 3 of 142 slices shown]
[im 48/142  soft-tissue]
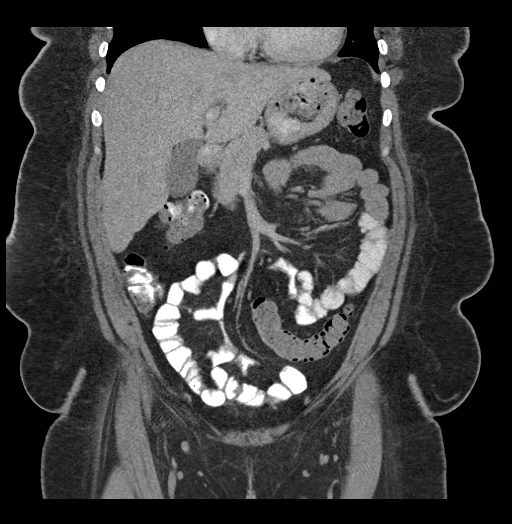
[im 63/142  soft-tissue]
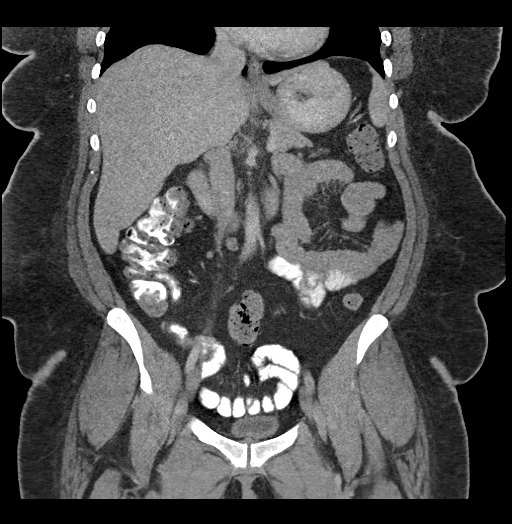
[im 79/142  soft-tissue]
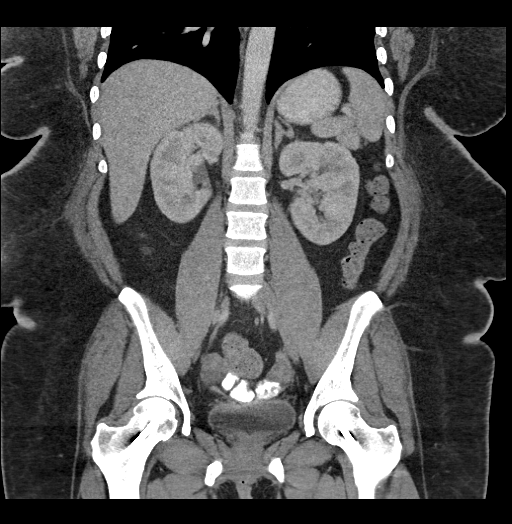

[17 of 46 positions shown; findings below may reference images not displayed]

FINDINGS: Lung bases are free of acute infiltrate or sizable effusion.

The liver, gallbladder, spleen, adrenal glands and pancreas are
within normal limits with the exception of mild decreased
attenuation of the liver consistent with fatty infiltration.

Kidneys are well visualized bilaterally and demonstrate no renal
calculi or urinary tract obstructive changes. A 2 cm hypodensity is
noted within the anterior aspect of the left kidney incompletely
evaluated on this exam. This may simply represent a complicated cyst
although further evaluation is recommended. Simple renal ultrasound
may be sufficient to allow for diagnosis.

The appendix is within normal limits. The bladder is partially
distended. No pelvic mass lesion or sidewall abnormality is noted.
Very minimal diverticular change is noted without diverticulitis. No
sidewall abnormality is noted. No significant lymphadenopathy is
seen. A left retro aortic renal vein is noted. Screw placement is
noted in the proximal femurs bilaterally.
IMPRESSION: Hypodensity within the left kidney as described. This may simply
represent a cyst and renal ultrasound is recommended for further
characterization.

No other focal abnormality is seen.

## 2017-04-01 ENCOUNTER — Encounter: Payer: Self-pay | Admitting: Family Medicine

## 2017-04-01 ENCOUNTER — Ambulatory Visit (INDEPENDENT_AMBULATORY_CARE_PROVIDER_SITE_OTHER): Payer: 59 | Admitting: Family Medicine

## 2017-04-01 VITALS — BP 162/100 | HR 74 | Temp 98.8°F | Ht 66.5 in | Wt 270.5 lb

## 2017-04-01 DIAGNOSIS — E1165 Type 2 diabetes mellitus with hyperglycemia: Secondary | ICD-10-CM

## 2017-04-01 DIAGNOSIS — Z794 Long term (current) use of insulin: Secondary | ICD-10-CM

## 2017-04-01 DIAGNOSIS — Z720 Tobacco use: Secondary | ICD-10-CM

## 2017-04-01 DIAGNOSIS — I10 Essential (primary) hypertension: Secondary | ICD-10-CM | POA: Diagnosis not present

## 2017-04-01 DIAGNOSIS — Z7689 Persons encountering health services in other specified circumstances: Secondary | ICD-10-CM | POA: Diagnosis not present

## 2017-04-01 DIAGNOSIS — IMO0001 Reserved for inherently not codable concepts without codable children: Secondary | ICD-10-CM

## 2017-04-01 LAB — COMPREHENSIVE METABOLIC PANEL
ALBUMIN: 4.2 g/dL (ref 3.5–5.2)
ALT: 16 U/L (ref 0–35)
AST: 12 U/L (ref 0–37)
Alkaline Phosphatase: 91 U/L (ref 39–117)
BUN: 11 mg/dL (ref 6–23)
CO2: 30 meq/L (ref 19–32)
Calcium: 9.3 mg/dL (ref 8.4–10.5)
Chloride: 102 mEq/L (ref 96–112)
Creatinine, Ser: 0.64 mg/dL (ref 0.40–1.20)
GFR: 133.39 mL/min (ref >60.00)
Glucose, Bld: 237 mg/dL — ABNORMAL HIGH (ref 70–99)
POTASSIUM: 4 meq/L (ref 3.5–5.1)
SODIUM: 137 meq/L (ref 135–145)
Total Bilirubin: 0.3 mg/dL (ref 0.2–1.2)
Total Protein: 7 g/dL (ref 6.0–8.3)

## 2017-04-01 LAB — TSH: TSH: 0.68 u[IU]/mL (ref 0.35–4.50)

## 2017-04-01 LAB — LIPID PANEL
Cholesterol: 201 mg/dL — ABNORMAL HIGH (ref 0–200)
HDL: 42.9 mg/dL (ref >39.00)
LDL Cholesterol: 136 mg/dL — ABNORMAL HIGH (ref 0–99)
NONHDL: 157.84
Total CHOL/HDL Ratio: 5
Triglycerides: 111 mg/dL (ref 0.0–149.0)
VLDL: 22.2 mg/dL (ref 0.0–40.0)

## 2017-04-01 LAB — T4, FREE: FREE T4: 0.89 ng/dL (ref 0.60–1.60)

## 2017-04-01 LAB — HEMOGLOBIN A1C: HEMOGLOBIN A1C: 10.5 % — AB (ref 4.6–6.5)

## 2017-04-01 MED ORDER — METFORMIN HCL 500 MG PO TABS: 500.0000 mg | ORAL_TABLET | Freq: Two times a day (BID) | ORAL | 3 refills | Status: DC

## 2017-04-01 MED ORDER — CHLORTHALIDONE 50 MG PO TABS: 50.0000 mg | ORAL_TABLET | Freq: Every day | ORAL | 3 refills | Status: DC

## 2017-04-01 MED ORDER — BLOOD GLUCOSE MONITOR KIT: PACK | 0 refills | Status: DC

## 2017-04-01 MED ORDER — INSULIN SYRINGES (DISPOSABLE) U-100 0.3 ML MISC: 8.0000 [IU] | Freq: Every day | 3 refills | Status: DC

## 2017-04-01 MED ORDER — INSULIN GLARGINE 100 UNIT/ML ~~LOC~~ SOLN: 8.0000 [IU] | Freq: Every day | SUBCUTANEOUS | 5 refills | Status: DC

## 2017-04-01 NOTE — Progress Notes (Signed)
Patient presents to clinic today to establish care.  SUBJECTIVE: PMH:  Pt p/w PMH sig for HTN, DM II uncontrolled.  She was formerly seen at [facility] by Dr. Alroy Dust.  Pt states she underwent some "struggles" and stopped taking care of herself.  She reports not taking any meds except for lantus10 units in the last 6 months.  Pt has not been checking fsbs at home as she does not have a meter.    DM II: -dx'd ~ 30yr ago -Pt d/c'd Metformin 2/2 diarrhea.  Was also taking glimeperide 4 mg. -Has been taking lantus 10 units during day instead of night -no meter, hasn't checked fsbs -denies HA, blurrd vision, N/V, increased thirst or urination. -Has upcoming eye appointment.  HTN: -was taking hydralazine 25 mg, HCTZ 25 mg, and lisinopril 40 mg. -states bp was still high despite meds -has not taken meds in 6 mos -denies HA , blurred vision, abd pain.  SurgHx: -Cesarean section x 3 -hysterectomy for fibroids at age 36  Had to have blood txf. -"pins in both hips" at age 36-tonsillectomy and adenoidectomy   Allergies: Latex- rash  SHx: Works at [employer]  Has 3 children ages 175 151 and 89  Smoking 3-4 cigarettes/day x 5 yrs.  Thinking about quitting.  Has cut down.  Occasional EtOH use, part of a hard cider.  No drug use.  FMHx: pt is adopted. Does state that she thinks her birth mom had DM, HTN, some heart condition  Past Medical History:  Diagnosis Date  . History of blood transfusion    "when I had tonsils taken out & w/hyster"  . Hypertension   . Menometrorrhagia   . Microcytic anemia   . [place] diabetes mellitus [place]     Past Surgical History:  Procedure Laterality Date  . ABDOMINAL HYSTERECTOMY  ~ 2012  . CESAREAN SECTION  1999; 2004; 2006  . HIP PINNING Bilateral ~ 1991   "balls had dropped out of their sockets"  . TONSILLECTOMY AND ADENOIDECTOMY    . TUBAL LIGATION  2006    Current Outpatient Prescriptions on File Prior to Visit  Medication Sig  Dispense Refill  . glimepiride (AMARYL) 4 MG tablet Take 1 tablet (4 mg total) by mouth daily with breakfast. 30 tablet 0  . hydrALAZINE (APRESOLINE) 25 MG tablet Take 1 tablet (25 mg total) by mouth every 8 (eight) hours. 90 tablet 0  . hydrochlorothiazide (HYDRODIURIL) 25 MG tablet Take 1 tablet (25 mg total) by mouth daily. 30 tablet 0  . lisinopril (PRINIVIL,ZESTRIL) 40 MG tablet Take 40 mg by mouth daily.    . traMADol (ULTRAM) 50 MG tablet Take 1 tablet (50 mg total) by mouth every 6 (six) hours as needed. 20 tablet 0  . [DISCONTINUED] ferrous fumarate (HEMOCYTE - 106 MG FE) 325 (106 FE) MG TABS Take 1 tablet by mouth daily.      . [DISCONTINUED] medroxyPROGESTERone (PROVERA) 10 MG tablet Take 10 mg by mouth daily.       No current facility-administered medications on file prior to visit.     Allergies  Allergen Reactions  . Latex Rash    Family History  Problem Relation Age of Onset  . Adopted: Yes  . Hypertension Daughter     Social History   Social History  . Marital status: Single    Spouse name: N/A  . Number of children: N/A  . Years of education: N/A   Occupational History  .  Not on file.   Social History Main Topics  . Smoking status: Former Smoker    Packs/day: 0.12    Years: 4.00    Types: Cigarettes    Quit date: 07/10/2014  . Smokeless tobacco: Never Used  . Alcohol use Yes     Comment: 11/22/2014 "might drink a little a couple times/month, if that"  . Drug use: No  . Sexual activity: Not Currently    Birth control/ protection: Surgical   Other Topics Concern  . Not on file   Social History Narrative  . No narrative on file    ROS  General: Denies fever, chills, night sweats, changes in weight, changes in appetite HEENT: Denies headaches, ear pain, changes in vision, rhinorrhea, sore throat CV: Denies CP, palpitations, SOB, orthopnea Pulm: Denies SOB, cough, wheezing GI: Denies abdominal pain, nausea, vomiting, diarrhea, constipation GU:  Denies dysuria, hematuria, frequency, vaginal discharge Msk: Denies muscle cramps, joint pains Neuro: Denies weakness, numbness, tingling Skin: Denies rashes, bruising Psych: Denies depression, anxiety, hallucinations   BP (!) 162/100   Pulse 74   Temp 98.8 F (37.1 C) (Oral)   Ht 5' 6.5" (1.689 m)   Wt 270 lb 8 oz (122.7 kg)   LMP 01/05/2011   BMI 43.01 kg/m   Physical Exam  Gen. Pleasant, obese, well developed, well-nourished, in NAD HEENT: [place]/AT, PERRL, EOMI, conjunctive clear, no scleral icterus, no nasal drainage, pharynx without erythema or exudate. Neck: No JVD, no thyromegaly, no carotid bruits Lungs: no use of accessory muscles, no dullness to percussion, CTAB, no wheezes, rales or rhonchi Cardiovascular: RRR, No r/g/m, no peripheral edema Abdomen: BS present, soft, nontender,nondistended, no hepatosplenomegaly Musculoskeletal: No deformities, moves all four extremities, no cyanosis or clubbing, normal tone Neuro:  A&Ox3, CN II-XII intact, normal gait Skin:  Warm, dry, intact, no lesions. Psych: normal affect, mood appropriate  Diabetic Foot Exam - Simple   Simple Foot Form Visual Inspection No deformities, no ulcerations, no other skin breakdown bilaterally:  Yes Sensation Testing Intact to touch and monofilament testing bilaterally:  Yes Pulse Check Posterior Tibialis and Dorsalis pulse intact bilaterally:  Yes Comments      No results found for this or any previous visit (from the past 2160 hour(s)).  Assessment/Plan: Uncontrolled type 2 diabetes mellitus without complication, with long-term current use of insulin (HCC) -Continue lantus but decreased amount to 8 units QHS -Pt to get glucometer and start check fsbs TID before meals and keep log - Plan: Hemoglobin A1c, Lipid panel, Protein / Creatinine Ratio, Urine, metFORMIN (GLUCOPHAGE) 500 MG tablet, blood glucose meter kit and supplies KIT, insulin glargine (LANTUS) 100 UNIT/ML injection, Insulin  Syringes, Disposable, U-100 0.3 ML MISC -will f/u in 2 wks.  Will make adjustments as needed.  Essential hypertension  -Plan: Comprehensive metabolic panel, TSH, T4, Free -chlorthalidone (HYGROTON) 50 MG tablet,  -will adjust as needed -f/u in 2 wks.     Tobacco use -smoking cessation >3 min, <10.   -Pt will continue cutting down 1 cig per day x next few wks. -Given 1-800 Quit now number - Considering other quit aids (gum, patch, etc)  Encounter to establish care with new doctor -records release form

## 2017-04-01 NOTE — Patient Instructions (Addendum)
We have ordered labs at this visit. It can take up to 1-2 weeks for results and processing. If results require follow up or explanation, we will call you with instructions. Clinically stable results will be released to your [facility] or sent to you via mail. If you have not heard from [place] or cannot find your results in [facility] in 2 weeks please contact our office at [phone].   How to Kick the Smoking Habit   Why should I quit smoking?  Quitting smoking is the most important thing you can do for your health. Smoking can cause cancer, lung disease, heart disease, and many other health problems. Secondhand smoke can be dangerous too. It can cause lung cancer and heart disease in adults. It can make asthma worse or cause ear infections in kids.   You'll see benefits as soon as you quit smoking. Your heart rate and blood pressure will go down. You'll breathe easier. It will be easier to exercise. Your sense of smell and taste will be better. You'll lower your risk of cancer, lung disease, and heart disease. You'll even live longer!   Why is it so hard to quit smoking?  Nicotine is a strong drug. Your body becomes addicted to nicotine when you smoke. You may have withdrawal symptoms or cravings when you stop smoking. You may become anxious or irritable. You might have trouble sleeping or want to eat more. These symptoms are usually worst the first week after quitting. The good news is nicotine withdrawal symptoms only last a few weeks for most people.  The routines and habits that go along with smoking can make it tough to quit too. Some people often smoke a cigarette when they drive, after a meal, or when they're on the phone. Smoking can become a part of these routines. After you quit smoking these habits can be a trigger to make you want to smoke again. It's important to separate smoking from these routines when you quit.  How can I make it easier to quit?  You don't have to quit "cold [place]." You  can double or triple the chance that you'll stop smoking if you use a medicine and counseling together. There are many medicines available. These medicines work in different ways to help manage nicotine withdrawal. Many can be bought off the shelves at your local pharmacy. Some require a prescription. Talk to your pharmacist or prescriber about what medicines may be right for you.  It is very important to have counseling when you quit. Medicines can help you cope with nicotine withdrawal. Counseling can help you develop skills to break smoking habits. There are lots of counseling options available. Many of these are free. Some options are local support groups, telephone quitlines, online services, and texting programs.   Start thinking now about how you plan to quit. Think about why you want to quit. Look at triggers that make you want to smoke. Plan for challenges you might face when trying to quit. Talk to your pharmacist about how to get help.   Where can I learn more?  Toll-free Quitlines and Websites:  In the [place] 1-800-QUIT-NOW(1-289-872-8492); http://smokefree.gov    These websites include online support, live chat, and text messaging programs.      Blood Glucose Monitoring, Adult Monitoring your blood sugar (glucose) helps you manage your diabetes. It also helps you and your health care provider determine how well your diabetes management plan is working. Blood glucose monitoring involves checking your blood glucose as often  as directed, and keeping a record (log) of your results over time. Why should I monitor my blood glucose? Checking your blood glucose regularly can:  Help you understand how food, exercise, illnesses, and medicines affect your blood glucose.  Let you know what your blood glucose is at any time. You can quickly tell if you are having low blood glucose (hypoglycemia) or high blood glucose (hyperglycemia).  Help you and your health care provider adjust your  medicines as needed.  When should I check my blood glucose? Follow instructions from your health care provider about how often to check your blood glucose. This may depend on:  The type of diabetes you have.  How well-controlled your diabetes is.  Medicines you are taking.  If you have type 1 diabetes:  Check your blood glucose at least 2 times a day.  Also check your blood glucose: ? Before every insulin injection. ? Before and after exercise. ? Between meals. ? 2 hours after a meal. ? Occasionally between 2:00 a.m. and 3:00 a.m., as directed. ? Before potentially dangerous tasks, like driving or using heavy machinery. ? At bedtime.  You may need to check your blood glucose more often, up to 6-10 times a day: ? If you use an insulin pump. ? If you need multiple daily injections (MDI). ? If your diabetes is not well-controlled. ? If you are ill. ? If you have a history of severe hypoglycemia. ? If you have a history of not knowing when your blood glucose is getting low (hypoglycemia unawareness). If you have type 2 diabetes:  If you take insulin or other diabetes medicines, check your blood glucose at least 2 times a day.  If you are on intensive insulin therapy, check your blood glucose at least 4 times a day. Occasionally, you may also need to check between 2:00 a.m. and 3:00 a.m., as directed.  Also check your blood glucose: ? Before and after exercise. ? Before potentially dangerous tasks, like driving or using heavy machinery.  You may need to check your blood glucose more often if: ? Your medicine is being adjusted. ? Your diabetes is not well-controlled. ? You are ill. What is a blood glucose log?  A blood glucose log is a record of your blood glucose readings. It helps you and your health care provider: ? Look for patterns in your blood glucose over time. ? Adjust your diabetes management plan as needed.  Every time you check your blood glucose, write down  your result and notes about things that may be affecting your blood glucose, such as your diet and exercise for the day.  Most glucose meters store a record of glucose readings in the meter. Some meters allow you to download your records to a computer. How do I check my blood glucose? Follow these steps to get accurate readings of your blood glucose: Supplies needed   Blood glucose meter.  Test strips for your meter. Each meter has its own strips. You must use the strips that come with your meter.  A needle to prick your finger (lancet). Do not use lancets more than once.  A device that holds the lancet (lancing device).  A journal or log book to write down your results. Procedure  Wash your hands with soap and water.  Prick the side of your finger (not the tip) with the lancet. Use a different finger each time.  Gently rub the finger until a small drop of blood appears.  Follow instructions  that come with your meter for inserting the test strip, applying blood to the strip, and using your blood glucose meter.  Write down your result and any notes. Alternative testing sites  Some meters allow you to use areas of your body other than your finger (alternative sites) to test your blood.  If you think you may have hypoglycemia, or if you have hypoglycemia unawareness, do not use alternative sites. Use your finger instead.  Alternative sites may not be as accurate as the fingers, because blood flow is slower in these areas. This means that the result you get may be delayed, and it may be different from the result that you would get from your finger.  The most common alternative sites are: ? Forearm. ? Thigh. ? Palm of the hand. Additional tips  Always keep your supplies with you.  If you have questions or need help, all blood glucose meters have a 24-hour "hotline" number that you can call. You may also contact your health care provider.  After you use a few boxes of test  strips, adjust (calibrate) your blood glucose meter by following instructions that came with your meter. This information is not intended to replace advice given to you by your health care provider. Make sure you discuss any questions you have with your health care provider. Document Released: 07/30/2003 Document Revised: 02/14/2016 Document Reviewed: 01/06/2016 Elsevier Interactive Patient Education  2017 [place]. Diabetes Mellitus and Food It is important for you to manage your blood sugar (glucose) level. Your blood glucose level can be greatly affected by what you eat. Eating healthier foods in the appropriate amounts throughout the day at about the same time each day will help you control your blood glucose level. It can also help slow or prevent worsening of your diabetes mellitus. Healthy eating may even help you improve the level of your blood pressure and reach or maintain a healthy weight. General recommendations for healthful eating and cooking habits include:  Eating meals and snacks regularly. Avoid going long periods of time without eating to lose weight.  Eating a diet that consists mainly of plant-based foods, such as fruits, vegetables, nuts, legumes, and whole grains.  Using low-heat cooking methods, such as baking, instead of high-heat cooking methods, such as deep frying.  Work with your dietitian to make sure you understand how to use the Nutrition Facts information on food labels. How can food affect me? Carbohydrates Carbohydrates affect your blood glucose level more than any other type of food. Your dietitian will help you determine how many carbohydrates to eat at each meal and teach you how to count carbohydrates. Counting carbohydrates is important to keep your blood glucose at a healthy level, especially if you are using insulin or taking certain medicines for diabetes mellitus. Alcohol Alcohol can cause sudden decreases in blood glucose (hypoglycemia), especially  if you use insulin or take certain medicines for diabetes mellitus. Hypoglycemia can be a life-threatening condition. Symptoms of hypoglycemia (sleepiness, dizziness, and disorientation) are similar to symptoms of having too much alcohol. If your health care provider has given you approval to drink alcohol, do so in moderation and use the following guidelines:  Women should not have more than one drink per day, and men should not have more than two drinks per day. One drink is equal to: ? 12 oz of beer. ? 5 oz of wine. ? 1 oz of hard liquor.  Do not drink on an empty stomach.  Keep yourself hydrated. Have  water, diet soda, or unsweetened iced tea.  Regular soda, juice, and other mixers might contain a lot of carbohydrates and should be counted.  What foods are not recommended? As you make food choices, it is important to remember that all foods are not the same. Some foods have fewer nutrients per serving than other foods, even though they might have the same number of calories or carbohydrates. It is difficult to get your body what it needs when you eat foods with fewer nutrients. Examples of foods that you should avoid that are high in calories and carbohydrates but low in nutrients include:  Trans fats (most processed foods list trans fats on the Nutrition Facts label).  Regular soda.  Juice.  Candy.  Sweets, such as cake, pie, doughnuts, and cookies.  Fried foods.  What foods can I eat? Eat nutrient-rich foods, which will nourish your body and keep you healthy. The food you should eat also will depend on several factors, including:  The calories you need.  The medicines you take.  Your weight.  Your blood glucose level.  Your blood pressure level.  Your cholesterol level.  You should eat a variety of foods, including:  Protein. ? Lean cuts of meat. ? Proteins low in saturated fats, such as fish, egg whites, and beans. Avoid processed meats.  Fruits and  vegetables. ? Fruits and vegetables that may help control blood glucose levels, such as apples, mangoes, and yams.  Dairy products. ? Choose fat-free or low-fat dairy products, such as milk, yogurt, and cheese.  Grains, bread, pasta, and rice. ? Choose whole grain products, such as multigrain bread, whole oats, and brown rice. These foods may help control blood pressure.  Fats. ? Foods containing healthful fats, such as nuts, avocado, olive oil, canola oil, and fish.  Does everyone with diabetes mellitus have the same meal plan? Because every person with diabetes mellitus is different, there is not one meal plan that works for everyone. It is very important that you meet with a dietitian who will help you create a meal plan that is just right for you. This information is not intended to replace advice given to you by your health care provider. Make sure you discuss any questions you have with your health care provider. Document Released: 04/23/2005 Document Revised: 01/02/2016 Document Reviewed: 06/23/2013 Elsevier Interactive Patient Education  2017 [place].  Managing Your Hypertension Hypertension is commonly called high blood pressure. This is when the force of your blood pressing against the walls of your arteries is too strong. Arteries are blood vessels that carry blood from your heart throughout your body. Hypertension forces the heart to work harder to pump blood, and may cause the arteries to become narrow or stiff. Having untreated or uncontrolled hypertension can cause heart attack, stroke, kidney disease, and other problems. What are blood pressure readings? A blood pressure reading consists of a higher number over a lower number. Ideally, your blood pressure should be below 120/80. The first ("top") number is called the systolic pressure. It is a measure of the pressure in your arteries as your heart beats. The second ("bottom") number is called the diastolic pressure. It is a  measure of the pressure in your arteries as the heart relaxes. What does my blood pressure reading mean? Blood pressure is classified into four stages. Based on your blood pressure reading, your health care provider may use the following stages to determine what type of treatment you need, if any. Systolic pressure and  diastolic pressure are measured in a unit called mm Hg. Normal  Systolic pressure: below 338.  Diastolic pressure: below 80. Elevated  Systolic pressure: 250-539.  Diastolic pressure: below 80. Hypertension stage 1  Systolic pressure: 767-341.  Diastolic pressure: 93-79. Hypertension stage 2  Systolic pressure: 024 or above.  Diastolic pressure: 90 or above. What health risks are associated with hypertension? Managing your hypertension is an important responsibility. Uncontrolled hypertension can lead to:  A heart attack.  A stroke.  A weakened blood vessel (aneurysm).  Heart failure.  Kidney damage.  Eye damage.  Metabolic syndrome.  Memory and concentration problems.  What changes can I make to manage my hypertension? Hypertension can be managed by making lifestyle changes and possibly by taking medicines. Your health care provider will help you make a plan to bring your blood pressure within a normal range. Eating and drinking  Eat a diet that is high in fiber and potassium, and low in salt (sodium), added sugar, and fat. An example eating plan is called the DASH (Dietary Approaches to Stop Hypertension) diet. To eat this way: ? Eat plenty of fresh fruits and vegetables. Try to fill half of your plate at each meal with fruits and vegetables. ? Eat whole grains, such as whole wheat pasta, brown rice, or whole grain bread. Fill about one quarter of your plate with whole grains. ? Eat low-fat diary products. ? Avoid fatty cuts of meat, processed or cured meats, and poultry with skin. Fill about one quarter of your plate with lean proteins such as fish,  chicken without skin, beans, eggs, and tofu. ? Avoid premade and processed foods. These tend to be higher in sodium, added sugar, and fat.  Reduce your daily sodium intake. Most people with hypertension should eat less than 1,500 mg of sodium a day.  Limit alcohol intake to no more than 1 drink a day for nonpregnant women and 2 drinks a day for men. One drink equals 12 oz of beer, 5 oz of wine, or 1 oz of hard liquor. Lifestyle  Work with your health care provider to maintain a healthy body weight, or to lose weight. Ask what an ideal weight is for you.  Get at least 30 minutes of exercise that causes your heart to beat faster (aerobic exercise) most days of the week. Activities may include walking, swimming, or biking.  Include exercise to strengthen your muscles (resistance exercise), such as weight lifting, as part of your weekly exercise routine. Try to do these types of exercises for 30 minutes at least 3 days a week.  Do not use any products that contain nicotine or tobacco, such as cigarettes and e-cigarettes. If you need help quitting, ask your health care provider.  Control any long-term (chronic) conditions you have, such as high cholesterol or diabetes. Monitoring  Monitor your blood pressure at home as told by your health care provider. Your personal target blood pressure may vary depending on your medical conditions, your age, and other factors.  Have your blood pressure checked regularly, as often as told by your health care provider. Working with your health care provider  Review all the medicines you take with your health care provider because there may be side effects or interactions.  Talk with your health care provider about your diet, exercise habits, and other lifestyle factors that may be contributing to hypertension.  Visit your health care provider regularly. Your health care provider can help you create and adjust your plan for managing  hypertension. Will I need  medicine to control my blood pressure? Your health care provider may prescribe medicine if lifestyle changes are not enough to get your blood pressure under control, and if:  Your systolic blood pressure is 130 or higher.  Your diastolic blood pressure is 80 or higher.  Take medicines only as told by your health care provider. Follow the directions carefully. Blood pressure medicines must be taken as prescribed. The medicine does not work as well when you skip doses. Skipping doses also puts you at risk for problems. Contact a health care provider if:  You think you are having a reaction to medicines you have taken.  You have repeated (recurrent) headaches.  You feel dizzy.  You have swelling in your ankles.  You have trouble with your vision. Get help right away if:  You develop a severe headache or confusion.  You have unusual weakness or numbness, or you feel faint.  You have severe pain in your chest or abdomen.  You vomit repeatedly.  You have trouble breathing. Summary  Hypertension is when the force of blood pumping through your arteries is too strong. If this condition is not controlled, it may put you at risk for serious complications.  Your personal target blood pressure may vary depending on your medical conditions, your age, and other factors. For most people, a normal blood pressure is less than 120/80.  Hypertension is managed by lifestyle changes, medicines, or both. Lifestyle changes include weight loss, eating a healthy, low-sodium diet, exercising more, and limiting alcohol. This information is not intended to replace advice given to you by your health care provider. Make sure you discuss any questions you have with your health care provider. Document Released: 04/20/2012 Document Revised: 06/24/2016 Document Reviewed: 06/24/2016 Elsevier Interactive Patient Education  2018 [employer].  Type 2 Diabetes Mellitus, Self Care, Adult When you have type 2  diabetes (type 2 diabetes mellitus), you must keep your blood sugar (glucose) under control. You can do this with:  Nutrition.  Exercise.  Lifestyle changes.  Medicines or insulin, if needed.  Support from your doctors and others.  How do I manage my blood sugar?  Check your blood sugar level every day, as often as told.  Call your doctor if your blood sugar is above your goal numbers for 2 tests in a row.  Have your A1c (hemoglobin A1c) level checked at least two times a year. Have it checked more often if your doctor tells you to. Your doctor will set treatment goals for you. Generally, you should have these blood sugar levels:  Before meals (preprandial): 80-130 mg/dL (4.4-7.2 mmol/L).  After meals (postprandial): lower than 180 mg/dL (10 mmol/L).  A1c level: less than 7%.  What do I need to know about high blood sugar? High blood sugar is called hyperglycemia. Know the signs of high blood sugar. Signs may include:  Feeling: ? Thirsty. ? Hungry. ? Very tired.  Needing to pee (urinate) more than usual.  Blurry vision.  What do I need to know about low blood sugar? Low blood sugar is called hypoglycemia. This is when blood sugar is at or below 70 mg/dL (3.9 mmol/L). Symptoms may include:  Feeling: ? Hungry. ? Worried or nervous (anxious). ? Sweaty and clammy. ? Confused. ? Dizzy. ? Sleepy. ? Sick to your stomach (nauseous).  Having: ? A fast heartbeat (palpitations). ? A headache. ? A change in your vision. ? Jerky movements that you cannot control (seizure). ? Nightmares. ?  Tingling or no feeling (numbness) around the mouth, lips, or tongue.  Having trouble with: ? Talking. ? Paying attention (concentrating). ? Moving (coordination). ? Sleeping.  Shaking.  Passing out (fainting).  Getting upset easily (irritability).  Treating low blood sugar  To treat low blood sugar, eat or drink something sugary right away. If you can think clearly and  swallow safely, follow the 15:15 rule:  Take 15 grams of a fast-acting carb (carbohydrate). Some fast-acting carbs are: ? 1 tube of glucose gel. ? 3 sugar tablets (glucose pills). ? 6-8 pieces of hard candy. ? 4 oz (120 mL) of fruit juice. ? 4 oz (120 mL) regular (not diet) soda.  Check your blood sugar 15 minutes after you take the carb.  If your blood sugar is still at or below 70 mg/dL (3.9 mmol/L), take 15 grams of a carb again.  If your blood sugar does not go above 70 mg/dL (3.9 mmol/L) after 3 tries, get help right away.  After your blood sugar goes back to normal, eat a meal or a snack within 1 hour.  Treating very low blood sugar If your blood sugar is at or below 54 mg/dL (3 mmol/L), you have very low blood sugar (severe hypoglycemia). This is an emergency. Do not wait to see if the symptoms will go away. Get medical help right away. Call your local emergency services (911 in the [place] Do not drive yourself to the hospital. If you have very low blood sugar and you cannot eat or drink, you may need a glucagon shot (injection). A family member or friend should learn how to check your blood sugar and how to give you a glucagon shot. Ask your doctor if you need to have a glucagon shot kit at home. What else is important to manage my diabetes? Medicine Follow these instructions about insulin and diabetes medicines:  Take them as told by your doctor.  Adjust them as told by your doctor.  Do not run out of them.  Having diabetes can raise your risk for other long-term conditions. These include heart or kidney disease. Your doctor may prescribe medicines to help prevent problems from diabetes. Food   Make healthy food choices. These include: ? Chicken, fish, egg whites, and beans. ? Oats, whole wheat, bulgur, brown rice, quinoa, and millet. ? Fresh fruits and vegetables. ? Low-fat dairy products. ? Nuts, avocado, olive oil, and canola oil.  Make a food plan with a  specialist (dietitian).  Follow instructions from your doctor about what you cannot eat or drink.  Drink enough fluid to keep your pee (urine) clear or pale yellow.  Eat healthy snacks between healthy meals.  Keep track of carbs that you eat. Read food labels. Learn food serving sizes.  Follow your sick day plan when you cannot eat or drink normally. Make this plan with your doctor so it is ready to use. Activity  Exercise at least 3 times a week.  Do not go more than 2 days without exercising.  Talk with your doctor before you start a new exercise. Your doctor may need to adjust your insulin, medicines, or food. Lifestyle   Do not use any tobacco products. These include cigarettes, chewing tobacco, and e-cigarettes.If you need help quitting, ask your doctor.  Ask your doctor how much alcohol is safe for you.  Learn to deal with stress. If you need help with this, ask your doctor. Body care  Stay up to date with your shots (immunizations).  Have your eyes and feet checked by a doctor as often as told.  Check your skin and feet every day. Check for cuts, bruises, redness, blisters, or sores.  Brush your teeth and gums two times a day.  Floss at least one time a day.  Go to the dentist least one time every 6 months.  Stay at a healthy weight. General instructions   Take over-the-counter and prescription medicines only as told by your doctor.  Share your diabetes care plan with: ? Your work or school. ? People you live with.  Check your pee (urine) for ketones: ? When you are sick. ? As told by your doctor.  Carry a card or wear jewelry that says that you have diabetes.  Ask your doctor: ? Do I need to meet with a diabetes educator? ? Where can I find a support group for people with diabetes?  Keep all follow-up visits as told by your doctor. This is important. Where to find more information: To learn more about diabetes, visit:  American Diabetes  Association: www.diabetes.org  American Association of Diabetes Educators: www.diabeteseducator.org/patient-resources  This information is not intended to replace advice given to you by your health care provider. Make sure you discuss any questions you have with your health care provider. Document Released: 11/18/2015 Document Revised: 01/02/2016 Document Reviewed: 08/30/2015 Elsevier Interactive Patient Education  [employer].

## 2017-04-02 ENCOUNTER — Telehealth: Payer: Self-pay | Admitting: *Deleted

## 2017-04-02 ENCOUNTER — Telehealth: Payer: Self-pay | Admitting: Emergency Medicine

## 2017-04-02 LAB — PROTEIN / CREATININE RATIO, URINE
Creatinine, Urine: 162 mg/dL (ref 20–320)
Protein Creatinine Ratio: 179 mg/g creat — ABNORMAL HIGH (ref 21–161)
Total Protein, Urine: 29 mg/dL — ABNORMAL HIGH (ref 5–24)

## 2017-04-02 MED ORDER — INSULIN PEN NEEDLE 32G X 4 MM MISC: 3 refills | Status: DC

## 2017-04-02 MED ORDER — BASAGLAR KWIKPEN 100 UNIT/ML ~~LOC~~ SOPN: 8.0000 [IU] | PEN_INJECTOR | Freq: Every day | SUBCUTANEOUS | 1 refills | Status: DC

## 2017-04-02 NOTE — Telephone Encounter (Deleted)
Left a VM for patient to give the office a call back.  

## 2017-04-02 NOTE — Telephone Encounter (Signed)
[facility] faxed a note to Dr Volanda Napoleon stating the Lantus vials are not covered and asked if this could be changed to pens and she also needs pen needles.  I called [facility] and spoke with Marcello Moores the pharmacist and he stated the pens are not covered by her insurance and this could be changed to [employer] and Dr Volanda Napoleon approved this.  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  I advised she call [facility] and ask for Marcello Moores to let the pharmacy know this.  Pen needles and Basaglar was sent to the pts pharmacy per Dr Volanda Napoleon.

## 2017-04-02 NOTE — Telephone Encounter (Signed)
error 

## 2017-04-07 ENCOUNTER — Telehealth: Payer: Self-pay | Admitting: Family Medicine

## 2017-04-07 NOTE — Telephone Encounter (Signed)
Attempted to return pt's call, no answer left message on machine to call back.    Notes recorded by Billie Ruddy, MD on 04/02/2017 at 8:13 AM EDT Hgb A1C 10.5. Protein/creatinine ratio elevated will need to start ACEI/ARB to help protect kidneys. Will also need to address elevated cholesterol and LDL with lifestyle modifications and statin. Will discuss in detail at [facility] in 2 wks.

## 2017-04-07 NOTE — Telephone Encounter (Signed)
Jasmine pt returned your call

## 2017-04-07 NOTE — Telephone Encounter (Signed)
Spoke with pt and informed her of her lab results per Dr. Volanda Napoleon. Pt is aware again of her follow up appt. She had no additional questions at this time. Nothing further is needed

## 2017-04-13 ENCOUNTER — Telehealth: Payer: Self-pay | Admitting: Pediatrics

## 2017-04-13 NOTE — Telephone Encounter (Signed)
[facility] request for insulin pen vs. Vial. Rx sent 04/02/17 per patient chart.  Fax is duplicate.

## 2017-04-15 ENCOUNTER — Ambulatory Visit (INDEPENDENT_AMBULATORY_CARE_PROVIDER_SITE_OTHER): Payer: 59 | Admitting: Family Medicine

## 2017-04-15 ENCOUNTER — Encounter: Payer: Self-pay | Admitting: Family Medicine

## 2017-04-15 VITALS — BP 120/80 | HR 78 | Temp 98.2°F | Wt 262.6 lb

## 2017-04-15 DIAGNOSIS — M791 Myalgia: Secondary | ICD-10-CM | POA: Diagnosis not present

## 2017-04-15 DIAGNOSIS — Z23 Encounter for immunization: Secondary | ICD-10-CM

## 2017-04-15 DIAGNOSIS — E1165 Type 2 diabetes mellitus with hyperglycemia: Secondary | ICD-10-CM | POA: Diagnosis not present

## 2017-04-15 DIAGNOSIS — F17210 Nicotine dependence, cigarettes, uncomplicated: Secondary | ICD-10-CM | POA: Diagnosis not present

## 2017-04-15 DIAGNOSIS — I10 Essential (primary) hypertension: Secondary | ICD-10-CM | POA: Diagnosis not present

## 2017-04-15 DIAGNOSIS — Z794 Long term (current) use of insulin: Secondary | ICD-10-CM

## 2017-04-15 DIAGNOSIS — IMO0001 Reserved for inherently not codable concepts without codable children: Secondary | ICD-10-CM

## 2017-04-15 DIAGNOSIS — M7918 Myalgia, other site: Secondary | ICD-10-CM

## 2017-04-15 MED ORDER — BLOOD PRESSURE MONITOR/L CUFF MISC: 0 refills | Status: AC

## 2017-04-15 NOTE — Patient Instructions (Addendum)
At today's visit we discussed increasing your Lantus to 12 Units at night.  Please continue to check your blood sugar and keep a log of it.    I think it will also be important for you to reduce stress.   Stress and Stress Management Stress is a normal reaction to life events. It is what you feel when life demands more than you are used to or more than you can handle. Some stress can be useful. For example, the stress reaction can help you catch the last bus of the day, study for a test, or meet a deadline at work. But stress that occurs too often or for too long can cause problems. It can affect your emotional health and interfere with relationships and normal daily activities. Too much stress can weaken your immune system and increase your risk for physical illness. If you already have a medical problem, stress can make it worse. What are the causes? All sorts of life events may cause stress. An event that causes stress for one person may not be stressful for another person. Major life events commonly cause stress. These may be positive or negative. Examples include losing your job, moving into a new home, getting married, having a baby, or losing a loved one. Less obvious life events may also cause stress, especially if they occur day after day or in combination. Examples include working long hours, driving in traffic, caring for children, being in debt, or being in a difficult relationship. What are the signs or symptoms? Stress may cause emotional symptoms including, the following:  Anxiety. This is feeling worried, afraid, on edge, overwhelmed, or out of control.  Anger. This is feeling irritated or impatient.  Depression. This is feeling sad, down, helpless, or guilty.  Difficulty focusing, remembering, or making decisions.  Stress may cause physical symptoms, including the following:  Aches and pains. These may affect your head, neck, back, stomach, or other areas of your body.  Tight  muscles or clenched jaw.  Low energy or trouble sleeping.  Stress may cause unhealthy behaviors, including the following:  Eating to feel better (overeating) or skipping meals.  Sleeping too little, too much, or both.  Working too much or putting off tasks (procrastination).  Smoking, drinking alcohol, or using drugs to feel better.  How is this diagnosed? Stress is diagnosed through an assessment by your health care provider. Your health care provider will ask questions about your symptoms and any stressful life events.Your health care provider will also ask about your medical history and may order blood tests or other tests. Certain medical conditions and medicine can cause physical symptoms similar to stress. Mental illness can cause emotional symptoms and unhealthy behaviors similar to stress. Your health care provider may refer you to a mental health professional for further evaluation. How is this treated? Stress management is the recommended treatment for stress.The goals of stress management are reducing stressful life events and coping with stress in healthy ways. Techniques for reducing stressful life events include the following:  Stress identification. Self-monitor for stress and identify what causes stress for you. These skills may help you to avoid some stressful events.  Time management. Set your priorities, keep a calendar of events, and learn to say "no." These tools can help you avoid making too many commitments.  Techniques for coping with stress include the following:  Rethinking the problem. Try to think realistically about stressful events rather than ignoring them or overreacting. Try to find the positives  in a stressful situation rather than focusing on the negatives.  Exercise. Physical exercise can release both physical and emotional tension. The key is to find a form of exercise you enjoy and do it regularly.  Relaxation techniques. These relax the body and  mind. Examples include yoga, meditation, tai chi, biofeedback, deep breathing, progressive muscle relaxation, listening to music, being out in nature, journaling, and other hobbies. Again, the key is to find one or more that you enjoy and can do regularly.  Healthy lifestyle. Eat a balanced diet, get plenty of sleep, and do not smoke. Avoid using alcohol or drugs to relax.  Strong support network. Spend time with family, friends, or other people you enjoy being around.Express your feelings and talk things over with someone you trust.  Counseling or talktherapy with a mental health professional may be helpful if you are having difficulty managing stress on your own. Medicine is typically not recommended for the treatment of stress.Talk to your health care provider if you think you need medicine for symptoms of stress. Follow these instructions at home:  Keep all follow-up visits as directed by your health care provider.  Take all medicines as directed by your health care provider. Contact a health care provider if:  Your symptoms get worse or you start having new symptoms.  You feel overwhelmed by your problems and can no longer manage them on your own. Get help right away if:  You feel like hurting yourself or someone else. This information is not intended to replace advice given to you by your health care provider. Make sure you discuss any questions you have with your health care provider. Document Released: 01/20/2001 Document Revised: 01/02/2016 Document Reviewed: 03/21/2013 Elsevier Interactive Patient Education  2017 [employer].

## 2017-04-15 NOTE — Progress Notes (Signed)
Subjective:    Patient ID: Rachel Dawson, female    DOB: 1980-10-13, 36 y.o.   MRN: [id number]  No chief complaint on file.   HPI Patient was seen today for f/u on DM and HTN.  Pt does mention last night while standing in the kitchen she experienced sharp pain in R neck and shoulder with tightness.  EMS was called.  Pt states she was told her bp was 200/100 and her "bone was sticking up".  Pt declined transport to ED.  Pt states she just wanted to make sure she wasn't having a MI or stroke.  Pt endorses soreness in R shoulder and neck today.  HTN: -taking Chlorthalidone 50 mg.  Formerly on lisinopril, but had skin irritation. -does not have a bp cuff at home -trying to eat better. -denies HA, blurred vision  DM II: -restarted lantus 8 units at last visit, however insurance prefers basaglar [employer].  Med switched. -fsbs in am 228-317.  In afternoon 150s-160s. -Pt has been trying to take Metformin.  Instead of BID, pt is taking 1000 mg qhs. -Pt endorses diarrhea 4 hrs after taking metformin and at least 2 x in the middle of the night. -pt denies any symptomatic lows -pt lost 8 lbs in 2 wks.  Tobacco use: -smoking less since last OFV -1 pk of cigarettes has lasted 2 wks.  Smoked 1 cigarette at work. -pt's kids have been motivating her to quit.    Family History  Problem Relation Age of Onset  . Adopted: Yes  . Hypertension Daughter    ROS  General: Denies fever, chills, night sweats, changes in appetite  +wt loss (trying to loose wt) HEENT: Denies headaches, ear pain, changes in vision, rhinorrhea, sore throat CV: Denies CP, palpitations, SOB, orthopnea Pulm: Denies SOB, cough, wheezing GI: Denies abdominal pain, nausea, vomiting, constipation  +diarrhea 2/2 metformin GU: Denies dysuria, hematuria, frequency, vaginal discharge Msk: Denies muscle cramps, joint pains  +R shoulder and neck soreness Neuro: Denies weakness, numbness, tingling Skin: Denies rashes, bruising Psych:  Denies depression, anxiety, hallucinations      Objective:    Blood pressure 120/80, pulse 78, temperature 98.2 F (36.8 C), temperature source Oral, weight 262 lb 9.6 oz (119.1 kg), last menstrual period 01/05/2011, SpO2 98 %.   Gen. Pleasant, well-nourished, in no distress, normal affect  HEENT: [place]/AT, face symmetric, no scleral icterus, PERRLA, EOMI, nares patent without drainage, pharynx without erythema or exudate. Neck: No JVD, no thyromegaly, acanthosis nigricans Lungs: no accessory muscle uss, CTAB, no wheezes or rales Cardiovascular: RRR, heart sounds normal, no m/r/g, no peripheral edema Abdomen: soft and non-tender, no hepatosplenomegaly, BS normal. Musculoskeletal: No deformities, no cyanosis or clubbing, normal tone Neuro:  A&Ox3, CN II-XII intact, normal gait Skin:  Warm, dry, intact.  Acanthosis nigricans    Wt Readings from Last 3 Encounters:  04/15/17 262 lb 9.6 oz (119.1 kg)  04/01/17 270 lb 8 oz (122.7 kg)  11/22/14 (!) 306 lb 3.5 oz (138.9 kg)    Diabetic Foot Exam - Simple   No data filed     Lab Results  Component Value Date   WBC 6.6 11/27/2014   HGB 11.4 (L) 11/27/2014   HCT 35.6 (L) 11/27/2014   PLT 278 11/27/2014   GLUCOSE 237 (H) 04/01/2017   CHOL 201 (H) 04/01/2017   TRIG 111.0 04/01/2017   HDL 42.90 04/01/2017   LDLCALC 136 (H) 04/01/2017   ALT 16 04/01/2017   AST 12 04/01/2017   NA 137  04/01/2017   K 4.0 04/01/2017   CL 102 04/01/2017   CREATININE 0.64 04/01/2017   BUN 11 04/01/2017   CO2 30 04/01/2017   TSH 0.68 04/01/2017   INR 1.18 11/22/2014   HGBA1C 10.5 (H) 04/01/2017    Assessment/Plan:  Essential hypertension, benign -  -bp today 120/80 -continue Chlorthalidone 50 mg -pt to start checking bp at home and keeping a log. -continue lifestyle modifications. Plan: Blood Pressure Monitoring (BLOOD PRESSURE MONITOR/L CUFF) MISC  Uncontrolled type 2 diabetes mellitus without complication, with long-term current use of  insulin (HCC) -Will increase basaglar kwikpen to 12 units qhs -Continue Metformin 1000 mg qhs.  Discussed taking 500 mg and increasing amount as tolerated.   -continue fsbs at home and keep log. -congratulated on 8 lbs wt loss. -may need additional med for better control.  Was formerly on glimepiride. -f/u in 1 month  Musculoskeletal pain -likely the cause of symptoms last night. -Ice, massage. -Ok to take tylenol if needed.  NSAIDs may increase bp.  Tobacco dependence due to cigarettes -smoking cessation counseling >3 min, <10 min -Now smoking less.  1 pk has lasted 2 wks. -Pt will continue to cut down.  Does not need meds at this time. -Has 1-800 Quit Now info -ask about at next [facility].  Needs flu shot - Plan: Flu Vaccine QUAD 6+ mos PF IM (Fluarix Quad PF)  Need for Tdap vaccination - Plan: Tdap vaccine greater than or equal to 7yo IM

## 2017-04-29 ENCOUNTER — Encounter: Payer: Self-pay | Admitting: Family Medicine

## 2017-05-27 ENCOUNTER — Encounter: Payer: Self-pay | Admitting: Family Medicine

## 2017-05-27 ENCOUNTER — Ambulatory Visit (INDEPENDENT_AMBULATORY_CARE_PROVIDER_SITE_OTHER): Payer: 59 | Admitting: Family Medicine

## 2017-05-27 VITALS — BP 120/90 | HR 78 | Temp 98.6°F | Wt 261.8 lb

## 2017-05-27 DIAGNOSIS — I152 Hypertension secondary to endocrine disorders: Secondary | ICD-10-CM

## 2017-05-27 DIAGNOSIS — IMO0001 Reserved for inherently not codable concepts without codable children: Secondary | ICD-10-CM

## 2017-05-27 DIAGNOSIS — E1165 Type 2 diabetes mellitus with hyperglycemia: Secondary | ICD-10-CM

## 2017-05-27 DIAGNOSIS — B379 Candidiasis, unspecified: Secondary | ICD-10-CM | POA: Diagnosis not present

## 2017-05-27 MED ORDER — FLUCONAZOLE 150 MG PO TABS: 150.0000 mg | ORAL_TABLET | Freq: Every day | ORAL | 0 refills | Status: DC

## 2017-05-27 NOTE — Patient Instructions (Addendum)
Diabetes Mellitus and Food It is important for you to manage your blood sugar (glucose) level. Your blood glucose level can be greatly affected by what you eat. Eating healthier foods in the appropriate amounts throughout the day at about the same time each day will help you control your blood glucose level. It can also help slow or prevent worsening of your diabetes mellitus. Healthy eating may even help you improve the level of your blood pressure and reach or maintain a healthy weight. General recommendations for healthful eating and cooking habits include:  Eating meals and snacks regularly. Avoid going long periods of time without eating to lose weight.  Eating a diet that consists mainly of plant-based foods, such as fruits, vegetables, nuts, legumes, and whole grains.  Using low-heat cooking methods, such as baking, instead of high-heat cooking methods, such as deep frying.  Work with your dietitian to make sure you understand how to use the Nutrition Facts information on food labels. How can food affect me? Carbohydrates Carbohydrates affect your blood glucose level more than any other type of food. Your dietitian will help you determine how many carbohydrates to eat at each meal and teach you how to count carbohydrates. Counting carbohydrates is important to keep your blood glucose at a healthy level, especially if you are using insulin or taking certain medicines for diabetes mellitus. Alcohol Alcohol can cause sudden decreases in blood glucose (hypoglycemia), especially if you use insulin or take certain medicines for diabetes mellitus. Hypoglycemia can be a life-threatening condition. Symptoms of hypoglycemia (sleepiness, dizziness, and disorientation) are similar to symptoms of having too much alcohol. If your health care provider has given you approval to drink alcohol, do so in moderation and use the following guidelines:  Women should not have more than one drink per day, and men  should not have more than two drinks per day. One drink is equal to: ? 12 oz of beer. ? 5 oz of wine. ? 1 oz of hard liquor.  Do not drink on an empty stomach.  Keep yourself hydrated. Have water, diet soda, or unsweetened iced tea.  Regular soda, juice, and other mixers might contain a lot of carbohydrates and should be counted.  What foods are not recommended? As you make food choices, it is important to remember that all foods are not the same. Some foods have fewer nutrients per serving than other foods, even though they might have the same number of calories or carbohydrates. It is difficult to get your body what it needs when you eat foods with fewer nutrients. Examples of foods that you should avoid that are high in calories and carbohydrates but low in nutrients include:  Trans fats (most processed foods list trans fats on the Nutrition Facts label).  Regular soda.  Juice.  Candy.  Sweets, such as cake, pie, doughnuts, and cookies.  Fried foods.  What foods can I eat? Eat nutrient-rich foods, which will nourish your body and keep you healthy. The food you should eat also will depend on several factors, including:  The calories you need.  The medicines you take.  Your weight.  Your blood glucose level.  Your blood pressure level.  Your cholesterol level.  You should eat a variety of foods, including:  Protein. ? Lean cuts of meat. ? Proteins low in saturated fats, such as fish, egg whites, and beans. Avoid processed meats.  Fruits and vegetables. ? Fruits and vegetables that may help control blood glucose levels, such as apples,   mangoes, and yams.  Dairy products. ? Choose fat-free or low-fat dairy products, such as milk, yogurt, and cheese.  Grains, bread, pasta, and rice. ? Choose whole grain products, such as multigrain bread, whole oats, and brown rice. These foods may help control blood pressure.  Fats. ? Foods containing healthful fats, such as  nuts, avocado, olive oil, canola oil, and fish.  Does everyone with diabetes mellitus have the same meal plan? Because every person with diabetes mellitus is different, there is not one meal plan that works for everyone. It is very important that you meet with a dietitian who will help you create a meal plan that is just right for you. This information is not intended to replace advice given to you by your health care provider. Make sure you discuss any questions you have with your health care provider. Document Released: 04/23/2005 Document Revised: 01/02/2016 Document Reviewed: 06/23/2013 Elsevier Interactive Patient Education  2017 [employer] Inc[employer]  

## 2017-05-28 ENCOUNTER — Encounter: Payer: Self-pay | Admitting: Internal Medicine

## 2017-05-28 ENCOUNTER — Ambulatory Visit (INDEPENDENT_AMBULATORY_CARE_PROVIDER_SITE_OTHER): Payer: 59 | Admitting: Internal Medicine

## 2017-05-28 VITALS — BP 140/84 | HR 89 | Ht 67.0 in | Wt 262.0 lb

## 2017-05-28 DIAGNOSIS — E1165 Type 2 diabetes mellitus with hyperglycemia: Secondary | ICD-10-CM | POA: Diagnosis not present

## 2017-05-28 DIAGNOSIS — Z794 Long term (current) use of insulin: Secondary | ICD-10-CM | POA: Diagnosis not present

## 2017-05-28 MED ORDER — GLIMEPIRIDE 2 MG PO TABS: 2.0000 mg | ORAL_TABLET | Freq: Every day | ORAL | 3 refills | Status: DC

## 2017-05-28 MED ORDER — BASAGLAR KWIKPEN 100 UNIT/ML ~~LOC~~ SOPN: 18.0000 [IU] | PEN_INJECTOR | Freq: Every day | SUBCUTANEOUS | 3 refills | Status: DC

## 2017-05-28 MED ORDER — METFORMIN HCL ER 500 MG PO TB24: 1000.0000 mg | ORAL_TABLET | Freq: Two times a day (BID) | ORAL | 3 refills | Status: DC

## 2017-05-28 NOTE — Patient Instructions (Signed)
Please increase: - Metformin to 1000 mg 2x a day with meals, and change to Metformin ER - Basaglar 18 units at bedtime  Please add: - Glimepiride 2 mg before dinner  Please return in 1.5 months with your sugar log.   Please let me know if the sugars are consistently <80 or >200.  PATIENT INSTRUCTIONS FOR TYPE 2 DIABETES:  **Please join MyChart!** - see attached instructions about how to join if you have not done so already.  DIET AND EXERCISE Diet and exercise is an important part of diabetic treatment.  We recommended aerobic exercise in the form of brisk walking (working between 40-60% of maximal aerobic capacity, similar to brisk walking) for 150 minutes per week (such as 30 minutes five days per week) along with 3 times per week performing 'resistance' training (using various gauge rubber tubes with handles) 5-10 exercises involving the major muscle groups (upper body, lower body and core) performing 10-15 repetitions (or near fatigue) each exercise. Start at half the above goal but build slowly to reach the above goals. If limited by weight, joint pain, or disability, we recommend daily walking in a swimming pool with water up to waist to reduce pressure from joints while allow for adequate exercise.    BLOOD GLUCOSES Monitoring your blood glucoses is important for continued management of your diabetes. Please check your blood glucoses 2-4 times a day: fasting, before meals and at bedtime (you can rotate these measurements - e.g. one day check before the 3 meals, the next day check before 2 of the meals and before bedtime, etc.).   HYPOGLYCEMIA (low blood sugar) Hypoglycemia is usually a reaction to not eating, exercising, or taking too much insulin/ other diabetes drugs.  Symptoms include tremors, sweating, hunger, confusion, headache, etc. Treat IMMEDIATELY with 15 grams of Carbs: . 4 glucose tablets .  cup regular juice/soda . 2 tablespoons raisins . 4 teaspoons sugar . 1  tablespoon honey Recheck blood glucose in 15 mins and repeat above if still symptomatic/blood glucose <100.  RECOMMENDATIONS TO REDUCE YOUR RISK OF DIABETIC COMPLICATIONS: * Take your prescribed MEDICATION(S) * Follow a DIABETIC diet: Complex carbs, fiber rich foods, (monounsaturated and polyunsaturated) fats * AVOID saturated/trans fats, high fat foods, >2,300 mg salt per day. * EXERCISE at least 5 times a week for 30 minutes or preferably daily.  * DO NOT SMOKE OR DRINK more than 1 drink a day. * Check your FEET every day. Do not wear tightfitting shoes. Contact us if you develop an ulcer * See your EYE doctor once a year or more if needed * Get a FLU shot once a year * Get a PNEUMONIA vaccine once before and once after age 32 years  GOALS:  * Your Hemoglobin A1c of <7%  * fasting sugars need to be <130 * after meals sugars need to be <180 (2h after you start eating) * Your Systolic BP should be 588 or lower  * Your Diastolic BP should be 80 or lower  * Your HDL (Good Cholesterol) should be 40 or higher  * Your LDL (Bad Cholesterol) should be 100 or lower. * Your Triglycerides should be 150 or lower  * Your Urine microalbumin (kidney function) should be <30 * Your Body Mass Index should be 25 or lower    Please consider the following ways to cut down carbs and fat and increase fiber and micronutrients in your diet: - substitute whole grain for white bread or pasta - substitute brown rice for  white rice - substitute 90-calorie flat bread pieces for slices of bread when possible - substitute sweet potatoes or yams for white potatoes - substitute humus for margarine - substitute tofu for cheese when possible - substitute almond or rice milk for regular milk (would not drink soy milk daily due to concern for soy estrogen influence on breast cancer risk) - substitute dark chocolate for other sweets when possible - substitute water - can add lemon or orange slices for taste - for diet  sodas (artificial sweeteners will trick your body that you can eat sweets without getting calories and will lead you to overeating and weight gain in the long run) - do not skip breakfast or other meals (this will slow down the metabolism and will result in more weight gain over time)  - can try smoothies made from fruit and almond/rice milk in am instead of regular breakfast - can also try old-fashioned (not instant) oatmeal made with almond/rice milk in am - order the dressing on the side when eating salad at a restaurant (pour less than half of the dressing on the salad) - eat as little meat as possible - can try juicing, but should not forget that juicing will get rid of the fiber, so would alternate with eating raw veg./fruits or drinking smoothies - use as little oil as possible, even when using olive oil - can dress a salad with a mix of balsamic vinegar and lemon juice, for e.g. - use agave nectar, stevia sugar, or regular sugar rather than artificial sweateners - steam or broil/roast veggies  - snack on veggies/fruit/nuts (unsalted, preferably) when possible, rather than processed foods - reduce or eliminate aspartame in diet (it is in diet sodas, chewing gum, etc) Read the labels!  Try to read Dr. Janene Harvey book: "Program for Reversing Diabetes" for other ideas for healthy eating. Please look up "The Engine 2 diet" by Christa See.

## 2017-05-28 NOTE — Progress Notes (Addendum)
Patient ID: Rachel Dawson, female   DOB: 10-19-1980, 36 y.o.   MRN: [id number]   HPI: Rachel Dawson is a 36 y.o.-year-old female, referred by her PCP, Billie Ruddy, MD, for management of DM2, dx in 2014, insulin-dependent, uncontrolled, without long term complications (but with hyperglycemia, yeast infections).  Last hemoglobin A1c was: Lab Results  Component Value Date   HGBA1C 10.5 (H) 04/01/2017   HGBA1C 9.1 (H) 11/22/2014   Pt is on a regimen of: - Metformin 500 mg 2x a day, with meals (N/D) - Lantus >> Basaglar 12 units at bedtime She was on - Amaryl 4 mg with b'fast - stopped ~ 1 year ago. She was on Victoza >> pancreatitis.  Pt checks her sugars 2x a day and they are: - am: 164-360, usu. 200s - 2h after b'fast: n/c - before lunch: n/c - 2h after lunch: n/c - before dinner: 140s - 2h after dinner: n/c - bedtime: n/c - nighttime: n/c No lows. Lowest sugar was 140; she has ? hypoglycemia awareness  Highest sugar was 383.  Glucometer:  One Touch Ultra  Pt's meals are: - Breakfast: 2 [place] bacon and 2 boiled eggs - Lunch: sandwich + veggies + fruit - Dinner: chicken, pork, [place], fish + veggies + brown rice or spaghetti, salad; occasional fast food - Snacks: ginger ale 2x a week, water, tea; juice  - no CKD, last BUN/creatinine:  Lab Results  Component Value Date   BUN 11 04/01/2017   BUN 7 11/27/2014   CREATININE 0.64 04/01/2017   CREATININE 0.75 11/27/2014   - + HL; last set of lipids: Lab Results  Component Value Date   CHOL 201 (H) 04/01/2017   HDL 42.90 04/01/2017   LDLCALC 136 (H) 04/01/2017   TRIG 111.0 04/01/2017   CHOLHDL 5 04/01/2017   - last eye exam was in 06/2016. No DR.  - no numbness and tingling in her feet.  Pt has FH of DM in mother (?)  - pt adopted.  ROS: Constitutional: + weight loss, + fatigue, no subjective hyperthermia/hypothermia, + increased urination Eyes: no blurry vision, no xerophthalmia ENT: no sore throat, no nodules  palpated in throat, no dysphagia/odynophagia, no hoarseness Cardiovascular: no CP/SOB/palpitations/leg swelling Respiratory: no cough/SOB Gastrointestinal: no N/V/D/C Musculoskeletal: no muscle/joint aches Skin: no rashes Neurological: no tremors/numbness/tingling/dizziness, + HA Psychiatric: no depression/anxiety  Past Medical History:  Diagnosis Date  . History of blood transfusion    "when I had tonsils taken out & w/hyster"  . Hypertension   . Menometrorrhagia   . Microcytic anemia   . Type II diabetes mellitus ([place])    Past Surgical History:  Procedure Laterality Date  . ABDOMINAL HYSTERECTOMY  ~ 2012  . CESAREAN SECTION  1999; 2004; 2006  . HIP PINNING Bilateral ~ 1991   "balls had dropped out of their sockets"  . TONSILLECTOMY AND ADENOIDECTOMY    . TUBAL LIGATION  2006   Social History   Social History  . Marital status: Single    Spouse name: N/A  . Number of children: 2    Occupational History  . Lab tech   Social History Main Topics  . Smoking status: Former Smoker    Packs/day: 0.12 - 2 cigs a day    Years: 4.00    Types: Cigarettes    Quit date: 07/10/2014  . Smokeless tobacco: Never Used  . Alcohol use Yes     Comment: 11/22/2014 "might drink a little a couple times/month, if that"  .  Drug use: No  . Sexual activity: Not Currently    Birth control/ protection: Surgical   Current Outpatient Prescriptions on File Prior to Visit  Medication Sig Dispense Refill  . blood glucose meter kit and supplies KIT Dispense based on patient and insurance preference. Use up to four times daily as directed. (FOR ICD-9 250.00, 250.01). 1 each 0  . Blood Pressure Monitoring (BLOOD PRESSURE MONITOR/L CUFF) MISC Check your blood pressure at the same time each day. 1 each 0  . chlorthalidone (HYGROTON) 50 MG tablet Take 1 tablet (50 mg total) by mouth daily. 30 tablet 3  . Insulin Pen Needle (BD PEN NEEDLE NANO U/F) 32G X 4 MM MISC Use one needle twice a day 100 each 3   . [DISCONTINUED] ferrous fumarate (HEMOCYTE - 106 MG FE) 325 (106 FE) MG TABS Take 1 tablet by mouth daily.      . [DISCONTINUED] medroxyPROGESTERone (PROVERA) 10 MG tablet Take 10 mg by mouth daily.       No current facility-administered medications on file prior to visit.    Allergies  Allergen Reactions  . Latex Rash   Family History  Problem Relation Age of Onset  . Adopted: Yes  . Hypertension Daughter     PE: BP 140/84 (BP Location: Left Arm, Patient Position: Sitting)   Pulse 89   Ht _0  (1.702 m)   Wt 262 lb (118.8 kg)   LMP 01/05/2011   SpO2 97%   BMI 41.04 kg/m  Wt Readings from Last 3 Encounters:  05/28/17 262 lb (118.8 kg)  05/27/17 261 lb 12.8 oz (118.8 kg)  04/15/17 262 lb 9.6 oz (119.1 kg)   Constitutional: obese, in NAD Eyes: PERRLA, EOMI, no exophthalmos ENT: moist mucous membranes, no thyromegaly, no cervical lymphadenopathy Cardiovascular: RRR, No MRG Respiratory: CTA B Gastrointestinal: abdomen soft, NT, ND, BS+ Musculoskeletal: no deformities, strength intact in all 4 Skin: moist, warm, no rashes,  Acanthosis nigricans neck Neurological: no tremor with outstretched hands, DTR normal in all 4  ASSESSMENT: 1. DM2, insulin-dependent, uncontrolled, without long term complications, but with hyperglycemia  PLAN:  1. Patient with 4 years of uncontrolled diabetes, on oral antidiabetic regimen + low dose basal insulin, which became insufficient. She developed pancreatitis from Victoza >> cannot use GLP1 R agonists for her. Also, she is on Chlorthalidone for HTN >> has frequent urination >> cannot use SGLT2 inhibitors - we are left with increasing Metformin (and will change to ER formulation as she may have N/D with regular metformin). Will also increase Basaglar. As I suspect her sugars in am are high 2/2 high postdinner sugars >> will add Glimepiride low dose before dinner. - we also discussed at length about improving her diet >> suggested mote veggies  and fruit, legumes, seeds, eliminate saturated fats: bacon, eggs, meats as a means to reverse her diabetes - I suggested to:  Patient Instructions  Please increase: - Metformin to 1000 mg 2x a day with meals, and change to Metformin ER - Basaglar 18 units at bedtime  Please add: - Glimepiride 2 mg before dinner  Please return in 1.5 months with your sugar log.   Please let me know if the sugars are consistently <80 or >200.  - Strongly advised her to start checking sugars at different times of the day - check 2 times a day, rotating checks - given sugar log and advised how to fill it and to bring it at next appt  - given foot care handout  and explained the principles  - given instructions for hypoglycemia management "15-15 rule"  - advised for yearly eye exams >> she is UTD - she had the flu shot this season. - Return to clinic in 1.5 mo with sugar log   Philemon Kingdom, MD PhD [facility] Endocrinology

## 2017-05-28 NOTE — Progress Notes (Signed)
Subjective:    Patient ID: Rachel Dawson, female    DOB: [birth year], 36 y.o.   MRN: [id number]  No chief complaint on file.   HPI  Patient was seen today for f/u on DM and HTN.  Pt states her work schedule has been crazy and she has not been able to stop and eat.  Endorses hyperglycemia at work- feeling diaphoretic, lightheaded.  Pt states her bs this am was 338.  She has noticed some increase in urination and maybe some increase in thirst.  Pt also endorses vaginal irritation and d/c when bs is elevated.  She denies polyphagia.  Pt currently taking Metformin 500 mg BID and basaglar 12 units at night.  Pt is still having some GI distress on the Metformin.  Hgb A1C on 10.5% on 04/01/17.   Pt wants to take better care of herself, but is concerned about her bs 2/2 an upcoming work trip.  In the past she was on metformin and glimeperide, then Victoza, but was hospitalized 2/2 pancreatitis.  During her hospitalization she was put on Lantus, but her insurance company would not cover the cost.  Pt has never been referred to Endocrinology.  HTN:  Taking chlorthalidone 50 mg daily.  States has been doing well on this med.  Bp has been 120/80 and 120s/70s outside the office.  In the past was on 3 different bp medications (lisinopril, hydralazine, and HCTZ) with little to no control.  Of note: lisinopril caused dry mouth and cough.  Past Medical History:  Diagnosis Date  . History of blood transfusion    "when I had tonsils taken out & w/hyster"  . Hypertension   . Menometrorrhagia   . Microcytic anemia   . Type II diabetes mellitus ([facility])     Allergies  Allergen Reactions  . Latex Rash    ROS General: Denies fever, chills, night sweats, changes in weight, changes in appetite HEENT: Denies headaches, ear pain, changes in vision, rhinorrhea, sore throat CV: Denies CP, palpitations, SOB, orthopnea Pulm: Denies SOB, cough, wheezing GI: Denies abdominal pain, nausea, vomiting, diarrhea,  constipation GU: Denies dysuria, hematuria    +increased urination, vaginal discharge Msk: Denies muscle cramps, joint pains Neuro: Denies weakness, numbness, tingling Skin: Denies rashes, bruising Psych: Denies depression, anxiety, hallucinations     Objective:    Blood pressure 120/90, pulse 78, temperature 98.6 F (37 C), temperature source Oral, weight 261 lb 12.8 oz (118.8 kg), last menstrual period 01/05/2011.   Gen. Pleasant, well-nourished, in no distress, normal affect   HEENT: [place]/AT, face symmetric, no scleral icterus, PERRLA, nares patent without drainage, pharynx without erythema or exudate. Lungs: no accessory muscle use, CTAB, no wheezes or rales Cardiovascular: RRR, no m/r/g, no peripheral edema Abdomen: BS present, soft, NT/ND Neuro:  A&Ox3, CN II-XII intact, normal gait Skin:  Warm, no lesions/ rash   Wt Readings from Last 3 Encounters:  05/27/17 261 lb 12.8 oz (118.8 kg)  04/15/17 262 lb 9.6 oz (119.1 kg)  04/01/17 270 lb 8 oz (122.7 kg)    Lab Results  Component Value Date   WBC 6.6 11/27/2014   HGB 11.4 (L) 11/27/2014   HCT 35.6 (L) 11/27/2014   PLT 278 11/27/2014   GLUCOSE 237 (H) 04/01/2017   CHOL 201 (H) 04/01/2017   TRIG 111.0 04/01/2017   HDL 42.90 04/01/2017   LDLCALC 136 (H) 04/01/2017   ALT 16 04/01/2017   AST 12 04/01/2017   NA 137 04/01/2017   K 4.0 04/01/2017  CL 102 04/01/2017   CREATININE 0.64 04/01/2017   BUN 11 04/01/2017   CO2 30 04/01/2017   TSH 0.68 04/01/2017   INR 1.18 11/22/2014   HGBA1C 10.5 (H) 04/01/2017    Assessment/Plan:  Diabetes mellitus type 2, uncontrolled, without complications ([place])  -last hgb A1c 10.5 on 04/01/17 -Continue Metformin 500 mg BID, have been slow to increase 2/2 GI distress.  Consider extended release. -Continue basaglar 12 units qhs.   -have been hesitant to start mealtime insulin given pt's inconsistent schedule. -Concerned about pt being hyperglycemic, especially with upcoming work trip.   Will place referral to Endo. - Plan: Ambulatory referral to Endocrinology---appreciate their assistance.  HTN: -controlled -continue chlorthalidone 50 mg daily -Given info on DASH eating plan. -encouraged to increase physical activity.  Yeast infection  -likely 2/2 hyperglycemia - Plan: fluconazole (DIFLUCAN) 150 MG tablet   F/u in 2 wks.

## 2017-06-02 ENCOUNTER — Other Ambulatory Visit: Payer: Self-pay | Admitting: Emergency Medicine

## 2017-06-02 DIAGNOSIS — I10 Essential (primary) hypertension: Secondary | ICD-10-CM

## 2017-06-02 MED ORDER — CHLORTHALIDONE 50 MG PO TABS: 50.0000 mg | ORAL_TABLET | Freq: Every day | ORAL | 0 refills | Status: DC

## 2017-06-10 ENCOUNTER — Ambulatory Visit: Payer: 59 | Admitting: Family Medicine

## 2017-06-10 DIAGNOSIS — Z0289 Encounter for other administrative examinations: Secondary | ICD-10-CM

## 2017-07-27 ENCOUNTER — Encounter: Payer: Self-pay | Admitting: Internal Medicine

## 2017-07-27 ENCOUNTER — Ambulatory Visit: Payer: 59 | Admitting: Internal Medicine

## 2017-07-27 VITALS — BP 130/88 | HR 78 | Ht 67.0 in | Wt 266.0 lb

## 2017-07-27 DIAGNOSIS — Z6841 Body Mass Index (BMI) 40.0 and over, adult: Secondary | ICD-10-CM | POA: Diagnosis not present

## 2017-07-27 DIAGNOSIS — Z794 Long term (current) use of insulin: Secondary | ICD-10-CM

## 2017-07-27 DIAGNOSIS — E1165 Type 2 diabetes mellitus with hyperglycemia: Secondary | ICD-10-CM | POA: Diagnosis not present

## 2017-07-27 LAB — POCT GLYCOSYLATED HEMOGLOBIN (HGB A1C): HEMOGLOBIN A1C: 10.4

## 2017-07-27 MED ORDER — GLIMEPIRIDE 2 MG PO TABS: 4.0000 mg | ORAL_TABLET | Freq: Every day | ORAL | 3 refills | Status: DC

## 2017-07-27 MED ORDER — BASAGLAR KWIKPEN 100 UNIT/ML ~~LOC~~ SOPN: 22.0000 [IU] | PEN_INJECTOR | Freq: Every day | SUBCUTANEOUS | 3 refills | Status: DC

## 2017-07-27 NOTE — Patient Instructions (Addendum)
Please continue: - Metformin ER 1000 mg 2x a day with meals  Please increase: - Basaglar to 22 units at bedtime - Glimepiride to 4 mg before dinner  Please return in 3 months with your sugar log.

## 2017-07-27 NOTE — Progress Notes (Signed)
Patient ID: Rachel Dawson, female   DOB: [birth year], 36 y.o.   MRN: [id number]   HPI: Rachel Dawson is a 36 y.o.-year-old female, returning for follow-up for DM2, dx in 2014, insulin-dependent, uncontrolled, without long term complications (but with hyperglycemia, yeast infections).  Last visit 2 months ago.  She is stressed at work >> missed meals. Stays active, but not formal exercise.  Last hemoglobin A1c was: Lab Results  Component Value Date   HGBA1C 10.5 (H) 04/01/2017   HGBA1C 9.1 (H) 11/22/2014   Pt is on a regimen of: - Metformin 500 mg 2x a day, with meals (N/D) >> Metformin ER 1000 mg 2x a day with meals - changed 05/2017 - Lantus >> Basaglar 12 >> 18 units at bedtime - Amaryl 2 mg before dinner - started 05/2017  She was on Victoza >> pancreatitis.  Pt checks her sugars 2x a day: - am: 164-360, usu. 200s >> 189-220 - 2h after b'fast: n/c - before lunch: n/c - 2h after lunch: n/c - before dinner: 140s >> 120-140s - 2h after dinner: n/c - bedtime: n/c - nighttime: n/c Lowest sugar was 140 >> 120 ? hypogly awareness Highest sugar was 383 >> 220.  Glucometer:  One Touch Ultra  Pt's meals are: - Breakfast: 2 [place] bacon and 2 boiled eggs - Lunch: sandwich + veggies + fruit - Dinner: chicken, pork, [place], fish + veggies + brown rice or spaghetti, salad; occasional fast food - Snacks: ginger ale 2x a week, water, tea; juice  - no CKD, last BUN/creatinine:  Lab Results  Component Value Date   BUN 11 04/01/2017   BUN 7 11/27/2014   CREATININE 0.64 04/01/2017   CREATININE 0.75 11/27/2014   - + HL; last set of lipids: Lab Results  Component Value Date   CHOL 201 (H) 04/01/2017   HDL 42.90 04/01/2017   LDLCALC 136 (H) 04/01/2017   TRIG 111.0 04/01/2017   CHOLHDL 5 04/01/2017   - last eye exam was in 06/2017 >> ? DR - no numbness and tingling in her feet.  Pt has FH of DM in mother (?)  - pt adopted.  ROS: Constitutional: no weight gain/no weight loss, no  fatigue, no subjective hyperthermia, no subjective hypothermia Eyes: no blurry vision, no xerophthalmia ENT: no sore throat, no nodules palpated in throat, no dysphagia, no odynophagia, no hoarseness Cardiovascular: no CP/no SOB/no palpitations/no leg swelling Respiratory: no cough/no SOB/no wheezing Gastrointestinal: no N/no V/no D/no C/no acid reflux Musculoskeletal: no muscle aches/no joint aches Skin: no rashes, no hair loss Neurological: no tremors/no numbness/no tingling/no dizziness  I reviewed pt's medications, allergies, PMH, social hx, family hx, and changes were documented in the history of present illness. Otherwise, unchanged from my initial visit note.  Past Medical History:  Diagnosis Date  . History of blood transfusion    "when I had tonsils taken out & w/hyster"  . Hypertension   . Menometrorrhagia   . Microcytic anemia   . Type II diabetes mellitus ([place])    Past Surgical History:  Procedure Laterality Date  . ABDOMINAL HYSTERECTOMY  ~ 2012  . CESAREAN SECTION  1999; 2004; 2006  . HIP PINNING Bilateral ~ 1991   "balls had dropped out of their sockets"  . TONSILLECTOMY AND ADENOIDECTOMY    . TUBAL LIGATION  2006   Social History   Social History  . Marital status: Single    Spouse name: N/A  . Number of children: 2    Occupational History  .  Lab tech   Social History Main Topics  . Smoking status: Former Smoker    Packs/day: 0.12 - 2 cigs a day    Years: 4.00    Types: Cigarettes    Quit date: 07/10/2014  . Smokeless tobacco: Never Used  . Alcohol use Yes     Comment: 11/22/2014 "might drink a little a couple times/month, if that"  . Drug use: No  . Sexual activity: Not Currently    Birth control/ protection: Surgical   Current Outpatient Medications on File Prior to Visit  Medication Sig Dispense Refill  . blood glucose meter kit and supplies KIT Dispense based on patient and insurance preference. Use up to four times daily as directed. (FOR  ICD-9 250.00, 250.01). 1 each 0  . Blood Pressure Monitoring (BLOOD PRESSURE MONITOR/L CUFF) MISC Check your blood pressure at the same time each day. 1 each 0  . chlorthalidone (HYGROTON) 50 MG tablet Take 1 tablet (50 mg total) by mouth daily. 90 tablet 0  . fluconazole (DIFLUCAN) 150 MG tablet Take 150 mg by mouth daily.    Marland Kitchen glimepiride (AMARYL) 2 MG tablet Take 1 tablet (2 mg total) by mouth daily before supper. 90 tablet 3  . Insulin Glargine (BASAGLAR KWIKPEN) 100 UNIT/ML SOPN Inject 0.18 mLs (18 Units total) into the skin at bedtime. 5 pen 3  . Insulin Pen Needle (BD PEN NEEDLE NANO U/F) 32G X 4 MM MISC Use one needle twice a day 100 each 3  . metFORMIN (GLUCOPHAGE-XR) 500 MG 24 hr tablet Take 2 tablets (1,000 mg total) by mouth 2 (two) times daily with a meal. 360 tablet 3  . [DISCONTINUED] ferrous fumarate (HEMOCYTE - 106 MG FE) 325 (106 FE) MG TABS Take 1 tablet by mouth daily.      . [DISCONTINUED] medroxyPROGESTERone (PROVERA) 10 MG tablet Take 10 mg by mouth daily.       No current facility-administered medications on file prior to visit.    Allergies  Allergen Reactions  . Latex Rash   Family History  Adopted: Yes  Problem Relation Age of Onset  . Hypertension Daughter     PE: Ht _0  (1.702 m)   Wt 266 lb (120.7 kg)   LMP 01/05/2011   BMI 41.66 kg/m  Wt Readings from Last 3 Encounters:  07/27/17 266 lb (120.7 kg)  05/28/17 262 lb (118.8 kg)  05/27/17 261 lb 12.8 oz (118.8 kg)   Constitutional: overweight, in NAD Eyes: PERRLA, EOMI, no exophthalmos ENT: moist mucous membranes, no thyromegaly, no cervical lymphadenopathy Cardiovascular: RRR, No MRG Respiratory: CTA B Gastrointestinal: abdomen soft, NT, ND, BS+ Musculoskeletal: no deformities, strength intact in all 4 Skin: moist, warm, +Acanthosis nigricans neck Neurological: no tremor with outstretched hands, DTR normal in all 4  ASSESSMENT: 1. DM2, insulin-dependent, uncontrolled, without long term  complications, but with hyperglycemia  2. Obesity BMI Classification:  < 18.5 underweight   18.5-24.9 normal weight   25.0-29.9 overweight   30.0-34.9 class I obesity   35.0-39.9 class II obesity   ? 40.0 class III obesity   PLAN:  1. Patient with history of uncontrolled diabetes, on oral antidiabetic regimen and basal insulin.  We increased the Basaglar, switched to metformin ER and increased the dose to 1000 mcg twice a day (she previously had nausea and diarrhea with regular metformin), and also added glimepiride 2 mg before dinner.  We also discussed at length about changing her diet by adding more veggies and fruit, legumes, seeds, eliminate  saturated fats - she is stressed at work >> cannot take time to eat >> eats late at night and is very hungry by then >> overeats >> sugars are likely high after dinner (she does not check then) and subsequently in am) >> will increase Glimepiride at dinnertime and also [place]. - I suggested to:  Patient Instructions  Please continue: - Metformin ER 1000 mg 2x a day with meals  Please increase: - Basaglar to 22 units at bedtime - Glimepiride to 4 mg before dinner  Please return in 3 months with your sugar log.   - today, HbA1c is 10.4% (stable, very high) - continue checking sugars at different times of the day - check 1x a day, rotating checks - advised for yearly eye exams >> she is UTD. [employer]. - Return to clinic in 3 mo with sugar log   2. Obesity class 3 - She gained weight since last visit - We are limited in our options diabetes medicines that can lead to weight loss due to the fact that she had pancreatitis from Victoza so we cannot use GLP-1 receptor agonist and also, she is on chlorthalidone for hypertension and has frequent urination so we cannot use SGL T2 inhibitors. - discussed about batch cooking and lighter meals if she needs to eat late  Philemon Kingdom, MD PhD [facility] Endocrinology

## 2017-07-27 NOTE — Addendum Note (Signed)
Addended by: Drucilla Schmidt on: 07/27/2017 09:26 AM   Modules accepted: Orders

## 2017-09-12 ENCOUNTER — Other Ambulatory Visit: Payer: Self-pay | Admitting: Family Medicine

## 2017-10-07 ENCOUNTER — Ambulatory Visit: Payer: 59 | Admitting: Internal Medicine

## 2017-10-07 DIAGNOSIS — E119 Type 2 diabetes mellitus without complications: Secondary | ICD-10-CM | POA: Insufficient documentation

## 2017-10-25 ENCOUNTER — Ambulatory Visit: Payer: 59 | Admitting: Internal Medicine

## 2017-10-26 ENCOUNTER — Other Ambulatory Visit: Payer: Self-pay | Admitting: Family Medicine

## 2017-12-26 ENCOUNTER — Other Ambulatory Visit: Payer: Self-pay | Admitting: Family Medicine

## 2017-12-27 ENCOUNTER — Other Ambulatory Visit: Payer: Self-pay

## 2017-12-27 MED ORDER — BASAGLAR KWIKPEN 100 UNIT/ML ~~LOC~~ SOPN: PEN_INJECTOR | SUBCUTANEOUS | 3 refills | Status: DC

## 2018-02-03 ENCOUNTER — Emergency Department (HOSPITAL_COMMUNITY)
Admission: EM | Admit: 2018-02-03 | Discharge: 2018-02-04 | Disposition: A | Discharge status: Home / Self Care | Payer: 59 | Attending: Emergency Medicine | Admitting: Emergency Medicine

## 2018-02-03 ENCOUNTER — Other Ambulatory Visit: Payer: Self-pay

## 2018-02-03 ENCOUNTER — Encounter (HOSPITAL_COMMUNITY): Payer: Self-pay | Admitting: Emergency Medicine

## 2018-02-03 DIAGNOSIS — E119 Type 2 diabetes mellitus without complications: Secondary | ICD-10-CM | POA: Insufficient documentation

## 2018-02-03 DIAGNOSIS — Z79899 Other long term (current) drug therapy: Secondary | ICD-10-CM | POA: Insufficient documentation

## 2018-02-03 DIAGNOSIS — I10 Essential (primary) hypertension: Secondary | ICD-10-CM | POA: Diagnosis not present

## 2018-02-03 DIAGNOSIS — Z87891 Personal history of nicotine dependence: Secondary | ICD-10-CM | POA: Diagnosis not present

## 2018-02-03 DIAGNOSIS — Z794 Long term (current) use of insulin: Secondary | ICD-10-CM | POA: Insufficient documentation

## 2018-02-03 DIAGNOSIS — R51 Headache: Secondary | ICD-10-CM | POA: Diagnosis not present

## 2018-02-03 DIAGNOSIS — R519 Headache, unspecified: Secondary | ICD-10-CM

## 2018-02-03 LAB — CBC
HEMATOCRIT: 37.8 % (ref 36.0–46.0)
HEMOGLOBIN: 11.9 g/dL — AB (ref 12.0–15.0)
MCH: 26.9 pg (ref 26.0–34.0)
MCHC: 31.5 g/dL (ref 30.0–36.0)
MCV: 85.3 fL (ref 78.0–100.0)
Platelets: 309 10*3/uL (ref 150–400)
RBC: 4.43 MIL/uL (ref 3.87–5.11)
RDW: 13.6 % (ref 11.5–15.5)
WBC: 8.9 10*3/uL (ref 4.0–10.5)

## 2018-02-03 LAB — BASIC METABOLIC PANEL
Anion gap: 9 (ref 5–15)
BUN: 10 mg/dL (ref 6–20)
CHLORIDE: 104 mmol/L (ref 98–111)
CO2: 23 mmol/L (ref 22–32)
CREATININE: 0.78 mg/dL (ref 0.44–1.00)
Calcium: 9.3 mg/dL (ref 8.9–10.3)
GFR calc Af Amer: >60 mL/min (ref >60)
GFR calc non Af Amer: >60 mL/min (ref >60)
Glucose, Bld: 140 mg/dL — ABNORMAL HIGH (ref 70–99)
POTASSIUM: 3.4 mmol/L — AB (ref 3.5–5.1)
Sodium: 136 mmol/L (ref 135–145)

## 2018-02-03 LAB — CBG MONITORING, ED: Glucose-Capillary: 159 mg/dL — ABNORMAL HIGH (ref 70–99)

## 2018-02-03 MED ORDER — METOCLOPRAMIDE HCL 5 MG/ML IJ SOLN
10.0000 mg | INTRAMUSCULAR | Status: AC
  Administered 2018-02-03: 10 mg via INTRAVENOUS
  Filled 2018-02-03: qty 2

## 2018-02-03 MED ORDER — SODIUM CHLORIDE 0.9 % IV BOLUS
1000.0000 mL | Freq: Once | INTRAVENOUS | Status: AC
  Administered 2018-02-03: 1000 mL via INTRAVENOUS

## 2018-02-03 MED ORDER — CHLORTHALIDONE 50 MG PO TABS: 50.0000 mg | ORAL_TABLET | Freq: Every day | ORAL | Status: DC

## 2018-02-03 MED ORDER — KETOROLAC TROMETHAMINE 30 MG/ML IJ SOLN
30.0000 mg | Freq: Once | INTRAMUSCULAR | Status: AC
  Administered 2018-02-03: 30 mg via INTRAVENOUS
  Filled 2018-02-03: qty 1

## 2018-02-03 NOTE — ED Notes (Signed)
When this tech went to the lobby to get pt she was in the bathroom without urine cup.

## 2018-02-03 NOTE — ED Provider Notes (Signed)
[facility] EMERGENCY DEPARTMENT Provider Note   CSN: [id number] Arrival date & time: 02/03/18  2112    History   Chief Complaint Chief Complaint  Patient presents with  . Headache  . Near Syncope    HPI Rachel Dawson is a 37 y.o. female.   37 year old female with a history of diabetes and hypertension presents to the emergency department for evaluation of headache.  Patient reports intermittent headache over the past week.  Symptoms worsened this morning.  Headache is global, pressure-like, throbbing.  It has been associated with lightheadedness and patient also developed paresthesias in her left arm later this afternoon.  This has since subsided.  She took 2 tablets of ibuprofen at 1900 today with little improvement to her headache.  She did not take her blood pressure medication this morning as prescribed, but has been compliant with all other doses.  No associated blurry vision, vision loss, tinnitus, hearing loss, difficulty speaking or swallowing, photophobia, phonophobia, nausea, vomiting, chest pain, shortness of breath, extremity numbness or weakness.  No history of recent head injury or fevers.     Past Medical History:  Diagnosis Date  . History of blood transfusion    "when I had tonsils taken out & w/hyster"  . Hypertension   . Menometrorrhagia   . Microcytic anemia   . Type II diabetes mellitus [facility])     Patient Active Problem List   Diagnosis Date Noted  . Pancreatitis, acute 11/23/2014  . Trichomonas vaginalis infection 11/23/2014  . Obesity 11/23/2014  . Steatohepatitis, non-alcoholic 69/62/9528  . Essential hypertension, benign 11/23/2014  . Transaminitis 11/22/2014  . Type 2 diabetes mellitus with hyperglycemia, with long-term current use of insulin ([place]) 11/22/2014  . Moderate dehydration 11/22/2014  . Nausea vomiting and diarrhea 11/22/2014  . Microcytic anemia   . Uterine fibroid   . Hypertension     Past Surgical History:    Procedure Laterality Date  . ABDOMINAL HYSTERECTOMY  ~ 2012  . CESAREAN SECTION  1999; 2004; 2006  . HIP PINNING Bilateral ~ 1991   "balls had dropped out of their sockets"  . TONSILLECTOMY AND ADENOIDECTOMY    . TUBAL LIGATION  2006     OB History   None      Home Medications    Prior to Admission medications   Medication Sig Start Date End Date Taking? Authorizing Provider  blood glucose meter kit and supplies KIT Dispense based on patient and insurance preference. Use up to four times daily as directed. (FOR ICD-9 250.00, 250.01). 04/01/17   Billie Ruddy, MD  Blood Pressure Monitoring (BLOOD PRESSURE MONITOR/L CUFF) MISC Check your blood pressure at the same time each day. 04/15/17   Billie Ruddy, MD  chlorthalidone (HYGROTON) 50 MG tablet Take 1 tablet (50 mg total) by mouth daily. 06/02/17   Billie Ruddy, MD  glimepiride (AMARYL) 2 MG tablet Take 2 tablets (4 mg total) by mouth daily before supper. 07/27/17   Philemon Kingdom, MD  Insulin Glargine (BASAGLAR KWIKPEN) 100 UNIT/ML SOPN Inject 0.22 mLs (22 Units total) into the skin at bedtime. 07/27/17   Philemon Kingdom, MD  Insulin Glargine [facility]) 100 UNIT/ML SOPN INJECT 8 UNITS INTO THE SKIN AT BEDTIME 12/27/17   Philemon Kingdom, MD  Insulin Glargine (BASAGLAR KWIKPEN) 100 UNIT/ML SOPN INJECT 22 UNITS INTO THE SKIN AT BEDTIME 12/27/17   Philemon Kingdom, MD  Insulin Pen Needle (BD PEN NEEDLE NANO U/F) 32G X 4 MM MISC Use one  needle twice a day 04/02/17   Billie Ruddy, MD  metFORMIN (GLUCOPHAGE-XR) 500 MG 24 hr tablet Take 2 tablets (1,000 mg total) by mouth 2 (two) times daily with a meal. 05/28/17   Philemon Kingdom, MD  ferrous fumarate (HEMOCYTE - 106 MG FE) 325 (106 FE) MG TABS Take 1 tablet by mouth daily.    10/28/11  [provider]  medroxyPROGESTERone (PROVERA) 10 MG tablet Take 10 mg by mouth daily.    10/28/11  [provider]    Family History Family History  Adopted:  Yes  Problem Relation Age of Onset  . Hypertension Daughter     Social History Social History   Tobacco Use  . Smoking status: Former Smoker    Packs/day: 0.12    Years: 4.00    Pack years: 0.48    Types: Cigarettes    Last attempt to quit: 07/10/2014    Years since quitting: 3.5  . Smokeless tobacco: Never Used  Substance Use Topics  . Alcohol use: Yes    Comment: occ  . Drug use: No     Allergies   Latex   Review of Systems Review of Systems Ten systems reviewed and are negative for acute change, except as noted in the HPI.    Physical Exam Updated Vital Signs BP (!) 193/106   Pulse 62   Temp 98.5 F (36.9 C) (Oral)   Resp 18   Ht _0  (1.702 m)   Wt 121.1 kg (267 lb)   LMP 01/05/2011   SpO2 100%   BMI 41.82 kg/m   Physical Exam  Constitutional: She is oriented to person, place, and time. She appears well-developed and well-nourished. No distress.  Nontoxic appearing and in NAD  HENT:  Head: Normocephalic and atraumatic.  No temporal artery tenderness.  Symmetric rise of the uvula with phonation.  Eyes: Conjunctivae and EOM are normal. No scleral icterus.  Neck: Normal range of motion.  Cardiovascular: Normal rate, regular rhythm and intact distal pulses.  Pulmonary/Chest: Effort normal. No stridor. No respiratory distress. She has no wheezes. She has no rales.  Lungs CTAB. Respirations even and unlabored.  Musculoskeletal: Normal range of motion.  Neurological: She is alert and oriented to person, place, and time. No cranial nerve deficit. She exhibits normal muscle tone. Coordination normal.  GCS 15. Speech is goal oriented. No cranial nerve deficits appreciated; symmetric eyebrow raise, no facial drooping, tongue midline. Patient has equal grip strength bilaterally with 5/5 strength against resistance in all major muscle groups bilaterally. Sensation to light touch intact. Patient moves extremities without ataxia. Patient ambulatory with steady gait.   Skin: Skin is warm and dry. No rash noted. She is not diaphoretic. No erythema. No pallor.  Psychiatric: She has a normal mood and affect. Her behavior is normal.  Nursing note and vitals reviewed.    ED Treatments / Results  Labs (all labs ordered are listed, but only abnormal results are displayed) Labs Reviewed  BASIC METABOLIC PANEL - Abnormal; Notable for the following components:      Result Value   Potassium 3.4 (*)    Glucose, Bld 140 (*)    All other components within normal limits  CBC - Abnormal; Notable for the following components:   Hemoglobin 11.9 (*)    All other components within normal limits  URINALYSIS, ROUTINE W REFLEX MICROSCOPIC - Abnormal; Notable for the following components:   Color, Urine STRAW (*)    All other components within normal limits  CBG MONITORING, ED - Abnormal; Notable for the following components:   Glucose-Capillary 159 (*)    All other components within normal limits  SEDIMENTATION RATE    EKG None  Radiology No results found.  Procedures Procedures (including critical care time)  Medications Ordered in ED Medications  sodium chloride 0.9 % bolus 1,000 mL (0 mLs Intravenous Stopped 02/04/18 0032)  metoCLOPramide (REGLAN) injection 10 mg (10 mg Intravenous Given 02/03/18 2331)  ketorolac (TORADOL) 30 MG/ML injection 30 mg (30 mg Intravenous Given 02/03/18 2331)  chlorthalidone (HYGROTON) tablet 50 mg (50 mg Oral Given 02/04/18 0059)    1:27 AM Patient reports that she is feeling much better following medications.  Blood pressure has improved down to 188/103.  This is following her chlorthalidone being given 30 minutes ago.  Anticipate that blood pressure will continue to improve.  Sedimentation rate is normal.  No present concern for temporal arteritis.   Initial Impression / Assessment and Plan / ED Course  I have reviewed the triage vital signs and the nursing notes.  Pertinent labs & imaging results that were available  during my care of the patient were reviewed by me and considered in my medical decision making (see chart for details).     Patient presents to the emergency department for evaluation of headache which began a few days ago, worsening today.  Patient with no history of recent head injury or trauma.  No fever, nuchal rigidity, meningismus to suggest meningitis.  Neurologic exam today is nonfocal.  Suspect headache to all or partially be secondary to her uncontrolled high blood pressure as patient is extremely hypertensive.  She did not take her blood pressure medication yesterday morning.  On reassessment, the patient has had significant improvement in headache symptoms following a migraine cocktail.  Blood pressure has been trending down.  I do not believe further emergent workup is indicated at this time.  Return precautions discussed and provided.  Patient discharged in stable condition with no unaddressed concerns.   Final Clinical Impressions(s) / ED Diagnoses   Final diagnoses:  Hypertension not at goal  Bad headache    ED Discharge Orders    None       Antonietta Breach, PA-C 02/04/18 0154    Tanna Furry, MD 02/14/18 1259

## 2018-02-03 NOTE — ED Triage Notes (Signed)
Pt reports unbearable headache all day with some lightheadedness with an overall "weird" feeling per pt. Headache 7/10. Pt also reports left arm numbness/tightness. Denies cp, n/v/d. Hx diabetes and HTN. Pt reports she is compliant with her BP medications but has had a recent medication change.

## 2018-02-04 ENCOUNTER — Telehealth: Payer: Self-pay | Admitting: Family Medicine

## 2018-02-04 LAB — URINALYSIS, ROUTINE W REFLEX MICROSCOPIC
BILIRUBIN URINE: NEGATIVE
GLUCOSE, UA: NEGATIVE mg/dL
Hgb urine dipstick: NEGATIVE
KETONES UR: NEGATIVE mg/dL
LEUKOCYTES UA: NEGATIVE
Nitrite: NEGATIVE
PROTEIN: NEGATIVE mg/dL
SPECIFIC GRAVITY, URINE: 1.005 (ref 1.005–1.030)
pH: 6 (ref 5.0–8.0)

## 2018-02-04 LAB — SEDIMENTATION RATE: Sed Rate: 18 mm/hr (ref 0–22)

## 2018-02-04 MED ORDER — CHLORTHALIDONE 50 MG PO TABS
50.0000 mg | ORAL_TABLET | ORAL | Status: AC
  Administered 2018-02-04: 50 mg via ORAL
  Filled 2018-02-04: qty 1

## 2018-02-04 NOTE — Discharge Instructions (Addendum)
Continue your blood pressure medication as prescribed.  Have your blood pressure rechecked on Monday by your primary doctor.  You may continue with Excedrin Migraine as needed for persistent headache.  Return for new or concerning symptoms.

## 2018-02-04 NOTE — Telephone Encounter (Signed)
Pt seen in ED 02/03/18 and was advised to follow up with PCP in 3 days (02/07/18)  Follow-Ups: Follow up with Billie Ruddy, MD (Family Medicine) in 3 days (02/07/2018);   For re-check of your blood pressure   Pt states that she needs refill of her chlorthalidone (HYGROTON) tablet 50 mg In the meantime.  Pt states that she is out and needs this refilled. Pt has been out of this for 1 week which is why her BP was elevated and she ended up in the ED.   Pt scheduled for ED f/u on 02/07/18 at 9:30a  Please advise Dr Volanda Napoleon. Thanks.

## 2018-02-07 ENCOUNTER — Ambulatory Visit: Payer: 59 | Admitting: Family Medicine

## 2018-02-07 ENCOUNTER — Encounter: Payer: Self-pay | Admitting: Family Medicine

## 2018-02-07 VITALS — BP 200/104 | HR 66 | Temp 98.9°F | Wt 226.0 lb

## 2018-02-07 DIAGNOSIS — F329 Major depressive disorder, single episode, unspecified: Secondary | ICD-10-CM | POA: Diagnosis not present

## 2018-02-07 DIAGNOSIS — B379 Candidiasis, unspecified: Secondary | ICD-10-CM | POA: Diagnosis not present

## 2018-02-07 DIAGNOSIS — F32A Depression, unspecified: Secondary | ICD-10-CM

## 2018-02-07 DIAGNOSIS — F419 Anxiety disorder, unspecified: Secondary | ICD-10-CM

## 2018-02-07 DIAGNOSIS — R110 Nausea: Secondary | ICD-10-CM | POA: Diagnosis not present

## 2018-02-07 DIAGNOSIS — I10 Essential (primary) hypertension: Secondary | ICD-10-CM

## 2018-02-07 MED ORDER — CHLORTHALIDONE 50 MG PO TABS: 50.0000 mg | ORAL_TABLET | Freq: Every day | ORAL | 1 refills | Status: DC

## 2018-02-07 MED ORDER — OMEPRAZOLE 20 MG PO CPDR: 20.0000 mg | DELAYED_RELEASE_CAPSULE | Freq: Every day | ORAL | 3 refills | Status: DC

## 2018-02-07 MED ORDER — FLUCONAZOLE 150 MG PO TABS: 150.0000 mg | ORAL_TABLET | Freq: Once | ORAL | 0 refills | Status: AC

## 2018-02-07 NOTE — Telephone Encounter (Signed)
Pt was seen this morning and Rx was refilled

## 2018-02-07 NOTE — Patient Instructions (Signed)
Living With Anxiety After being diagnosed with an anxiety disorder, you may be relieved to know why you have felt or behaved a certain way. It is natural to also feel overwhelmed about the treatment ahead and what it will mean for your life. With care and support, you can manage this condition and recover from it. How to cope with anxiety Dealing with stress Stress is your body's reaction to life changes and events, both good and bad. Stress can last just a few hours or it can be ongoing. Stress can play a major role in anxiety, so it is important to learn both how to cope with stress and how to think about it differently. Talk with your health care provider or a counselor to learn more about stress reduction. He or she may suggest some stress reduction techniques, such as:  Music therapy. This can include creating or listening to music that you enjoy and that inspires you.  Mindfulness-based meditation. This involves being aware of your normal breaths, rather than trying to control your breathing. It can be done while sitting or walking.  Centering prayer. This is a kind of meditation that involves focusing on a word, phrase, or sacred image that is meaningful to you and that brings you peace.  Deep breathing. To do this, expand your stomach and inhale slowly through your nose. Hold your breath for 3-5 seconds. Then exhale slowly, allowing your stomach muscles to relax.  Self-talk. This is a skill where you identify thought patterns that lead to anxiety reactions and correct those thoughts.  Muscle relaxation. This involves tensing muscles then relaxing them.  Choose a stress reduction technique that fits your lifestyle and personality. Stress reduction techniques take time and practice. Set aside 5-15 minutes a day to do them. Therapists can offer training in these techniques. The training may be covered by some insurance plans. Other things you can do to manage stress include:  Keeping a  stress diary. This can help you learn what triggers your stress and ways to control your response.  Thinking about how you respond to certain situations. You may not be able to control everything, but you can control your reaction.  Making time for activities that help you relax, and not feeling guilty about spending your time in this way.  Therapy combined with coping and stress-reduction skills provides the best chance for successful treatment. Medicines Medicines can help ease symptoms. Medicines for anxiety include:  Anti-anxiety drugs.  Antidepressants.  Beta-blockers.  Medicines may be used as the main treatment for anxiety disorder, along with therapy, or if other treatments are not working. Medicines should be prescribed by a health care provider. Relationships Relationships can play a big part in helping you recover. Try to spend more time connecting with trusted friends and family members. Consider going to couples counseling, taking family education classes, or going to family therapy. Therapy can help you and others better understand the condition. How to recognize changes in your condition Everyone has a different response to treatment for anxiety. Recovery from anxiety happens when symptoms decrease and stop interfering with your daily activities at home or work. This may mean that you will start to:  Have better concentration and focus.  Sleep better.  Be less irritable.  Have more energy.  Have improved memory.  It is important to recognize when your condition is getting worse. Contact your health care provider if your symptoms interfere with home or work and you do not feel like your condition   is improving. Where to find help and support: You can get help and support from these sources:  Self-help groups.  Online and [employer].  A trusted spiritual leader.  Couples counseling.  Family education classes.  Family therapy.  Follow these  instructions at home:  Eat a healthy diet that includes plenty of vegetables, fruits, whole grains, low-fat dairy products, and lean protein. Do not eat a lot of foods that are high in solid fats, added sugars, or salt.  Exercise. Most adults should do the following: ? Exercise for at least 150 minutes each week. The exercise should increase your heart rate and make you sweat (moderate-intensity exercise). ? Strengthening exercises at least twice a week.  Cut down on caffeine, tobacco, alcohol, and other potentially harmful substances.  Get the right amount and quality of sleep. Most adults need 7-9 hours of sleep each night.  Make choices that simplify your life.  Take over-the-counter and prescription medicines only as told by your health care provider.  Avoid caffeine, alcohol, and certain over-the-counter cold medicines. These may make you feel worse. Ask your pharmacist which medicines to avoid.  Keep all follow-up visits as told by your health care provider. This is important. Questions to ask your health care provider  Would I benefit from therapy?  How often should I follow up with a health care provider?  How long do I need to take medicine?  Are there any long-term side effects of my medicine?  Are there any alternatives to taking medicine? Contact a health care provider if:  You have a hard time staying focused or finishing daily tasks.  You spend many hours a day feeling worried about everyday life.  You become exhausted by worry.  You start to have headaches, feel tense, or have nausea.  You urinate more than normal.  You have diarrhea. Get help right away if:  You have a racing heart and shortness of breath.  You have thoughts of hurting yourself or others. If you ever feel like you may hurt yourself or others, or have thoughts about taking your own life, get help right away. You can go to your nearest emergency department or call:  Your local emergency  services (911 in the [place]  A suicide crisis helpline, such as the [place] at (757)756-5110. This is open 24-hours a day.  Summary  Taking steps to deal with stress can help calm you.  Medicines cannot cure anxiety disorders, but they can help ease symptoms.  Family, friends, and partners can play a big part in helping you recover from an anxiety disorder. This information is not intended to replace advice given to you by your health care provider. Make sure you discuss any questions you have with your health care provider. Document Released: 07/21/2016 Document Revised: 07/21/2016 Document Reviewed: 07/21/2016 Elsevier Interactive Patient Education  2018 [place] With Loss, Adult People experience loss in many different ways throughout their lives. Events such as moving, changing jobs, and losing friends can create a sense of loss. The loss may be as serious as a major health change, divorce, death of a pet, or death of a loved one. All of these types of loss are likely to create a physical and emotional reaction known as grief. Grief is the result of a major change or an absence of something or someone that you count on. Grief is a normal reaction to loss. How to recognize changes A variety of factors can affect  your grieving experience, including:  The nature of your loss.  Your relationship to what or whom you lost.  Your understanding of grief and how to cope with it.  Your support system.  The way that you deal with your grief will affect your ability to function as you normally do. When you are grieving, you may experience:  Numbness, shock, sadness, anxiety, anger, denial, and guilt.  Thoughts about death.  Unexpected crying.  A physical sensation of emptiness in your gut.  Problems sleeping and eating.  Fatigue.  Loss of interest in normal activities.  Dreaming about or imagining seeing the person who died.  A need to  remember what or whom you lost.  Difficulty thinking about anything other than your loss for a period of time.  Relief. If you have been expecting the loss for a while, you may feel a sense of relief when it happens.  Where to find support To get support for coping with loss:  Ask your health care provider for help and recommendations, such as grief counseling or therapy.  Think about joining a support group for people who are coping with loss.  Follow these instructions at home:  Be patient with yourself and others. Allow the grieving process to happen, and remember that grieving takes time. ? It is likely that you may never feel completely done with some grief. You may find a way to move on while still cherishing memories and feelings about your loss. ? Accepting your loss is a process. It can take months or longer to adjust.  Express your feelings in healthy ways, such as: ? Talking with others about your loss. It may be helpful to find others who have had a similar loss, such as a support group. ? Writing down your feelings in a journal. ? Doing physical activities to release stress and emotional energy. ? Doing creative activities like painting, sculpting, or playing or listening to music. ? Practicing resilience. This is the ability to recover and adjust after facing challenges. Reading some resources that encourage resilience may help you to learn ways to practice those behaviors.  Keep to your normal routine as much as possible. If you have trouble focusing or doing normal activities, it is acceptable to take some time away from your normal routine.  Spend time with friends and loved ones.  Eat a healthy diet, get plenty of sleep, and rest when you feel tired. Where to find more information: You can find more information about coping with loss from:  American Society of Clinical Oncology: www.cancer.net  American Psychological Association: TVStereos.ch  Contact a health  care provider if:  Your grief is extreme and keeps getting worse.  You have ongoing grief that does not improve.  Your body shows symptoms of grief, such as illness.  You feel depressed, anxious, or lonely. Get help right away if:  You have thoughts about hurting yourself or others. If you ever feel like you may hurt yourself or others, or have thoughts about taking your own life, get help right away. You can go to your nearest emergency department or call:  Your local emergency services (911 in the [place]  A suicide crisis helpline, such as the [place] at (269) 654-0186. This is open 24 hours a day.  Summary  Grief is a normal part of experiencing a loss. It is the result of a major change or an absence of something or someone that you count on.  The depth of  grief and the period of recovery depend on the type of loss as well as your ability to adjust to the change and process your feelings.  Processing grief requires patience and a willingness to accept your feelings and talk about your loss with people who are supportive.  It is important to find resources that work for you and to realize that we are all different when it comes to grief. There is not one single grieving process that works for everyone in the same way.  Be aware that when grief becomes extreme, it can lead to more severe issues like isolation, depression, anxiety, or suicidal thoughts. Talk with your health care provider if you have any of these issues. This information is not intended to replace advice given to you by your health care provider. Make sure you discuss any questions you have with your health care provider. Document Released: 12/10/2016 Document Revised: 12/10/2016 Document Reviewed: 12/10/2016 Elsevier Interactive Patient Education  2018 [place] With Depression Everyone experiences occasional disappointment, sadness, and loss in their lives. When you are  feeling down, blue, or sad for at least 2 weeks in a row, it may mean that you have depression. Depression can affect your thoughts and feelings, relationships, daily activities, and physical health. It is caused by changes in the way your brain functions. If you receive a diagnosis of depression, your health care provider will tell you which type of depression you have and what treatment options are available to you. If you are living with depression, there are ways to help you recover from it and also ways to prevent it from coming back. How to cope with lifestyle changes Coping with stress Stress is your body's reaction to life changes and events, both good and bad. Stressful situations may include:  Getting married.  The death of a spouse.  Losing a job.  Retiring.  Having a baby.  Stress can last just a few hours or it can be ongoing. Stress can play a major role in depression, so it is important to learn both how to cope with stress and how to think about it differently. Talk with your health care provider or a counselor if you would like to learn more about stress reduction. He or she may suggest some stress reduction techniques, such as:  Music therapy. This can include creating music or listening to music. Choose music that you enjoy and that inspires you.  Mindfulness-based meditation. This kind of meditation can be done while sitting or walking. It involves being aware of your normal breaths, rather than trying to control your breathing.  Centering prayer. This is a kind of meditation that involves focusing on a spiritual word or phrase. Choose a word, phrase, or sacred image that is meaningful to you and that brings you peace.  Deep breathing. To do this, expand your stomach and inhale slowly through your nose. Hold your breath for 3-5 seconds, then exhale slowly, allowing your stomach muscles to relax.  Muscle relaxation. This involves intentionally tensing muscles then  relaxing them.  Choose a stress reduction technique that fits your lifestyle and personality. Stress reduction techniques take time and practice to develop. Set aside 5-15 minutes a day to do them. Therapists can offer training in these techniques. The training may be covered by some insurance plans. Other things you can do to manage stress include:  Keeping a stress diary. This can help you learn what triggers your stress and ways to control your response.  Understanding what your limits are and saying no to requests or events that lead to a schedule that is too full.  Thinking about how you respond to certain situations. You may not be able to control everything, but you can control how you react.  Adding humor to your life by watching funny films or TV shows.  Making time for activities that help you relax and not feeling guilty about spending your time this way.  Medicines Your health care provider may suggest certain medicines if he or she feels that they will help improve your condition. Avoid using alcohol and other substances that may prevent your medicines from working properly (may interact). It is also important to:  Talk with your pharmacist or health care provider about all the medicines that you take, their possible side effects, and what medicines are safe to take together.  Make it your goal to take part in all treatment decisions (shared decision-making). This includes giving input on the side effects of medicines. It is best if shared decision-making with your health care provider is part of your total treatment plan.  If your health care provider prescribes a medicine, you may not notice the full benefits of it for 4-8 weeks. Most people who are treated for depression need to be on medicine for at least 6-12 months after they feel better. If you are taking medicines as part of your treatment, do not stop taking medicines without first talking to your health care provider. You  may need to have the medicine slowly decreased (tapered) over time to decrease the risk of harmful side effects. Relationships Your health care provider may suggest family therapy along with individual therapy and drug therapy. While there may not be family problems that are causing you to feel depressed, it is still important to make sure your family learns as much as they can about your mental health. Having your family's support can help make your treatment successful. How to recognize changes in your condition Everyone has a different response to treatment for depression. Recovery from major depression happens when you have not had signs of major depression for two months. This may mean that you will start to:  Have more interest in doing activities.  Feel less hopeless than you did 2 months ago.  Have more energy.  Overeat less often, or have better or improving appetite.  Have better concentration.  Your health care provider will work with you to decide the next steps in your recovery. It is also important to recognize when your condition is getting worse. Watch for these signs:  Having fatigue or low energy.  Eating too much or too little.  Sleeping too much or too little.  Feeling restless, agitated, or hopeless.  Having trouble concentrating or making decisions.  Having unexplained physical complaints.  Feeling irritable, angry, or aggressive.  Get help as soon as you or your family members notice these symptoms coming back. How to get support and help from others How to talk with friends and family members about your condition Talking to friends and family members about your condition can provide you with one way to get support and guidance. Reach out to trusted friends or family members, explain your symptoms to them, and let them know that you are working with a health care provider to treat your depression. Financial resources Not all insurance plans cover mental  health care, so it is important to check with your insurance carrier. If paying for co-pays or counseling  services is a problem, search for a local or county mental health care center. They may be able to offer public mental health care services at low or no cost when you are not able to see a private health care provider. If you are taking medicine for depression, you may be able to get the generic form, which may be less expensive. Some makers of prescription medicines also offer help to patients who cannot afford the medicines they need. Follow these instructions at home:  Get the right amount and quality of sleep.  Cut down on using caffeine, tobacco, alcohol, and other potentially harmful substances.  Try to exercise, such as walking or lifting small weights.  Take over-the-counter and prescription medicines only as told by your health care provider.  Eat a healthy diet that includes plenty of vegetables, fruits, whole grains, low-fat dairy products, and lean protein. Do not eat a lot of foods that are high in solid fats, added sugars, or salt.  Keep all follow-up visits as told by your health care provider. This is important. Contact a health care provider if:  You stop taking your antidepressant medicines, and you have any of these symptoms: ? Nausea. ? Headache. ? Feeling lightheaded. ? Chills and body aches. ? Not being able to sleep (insomnia).  You or your friends and family think your depression is getting worse. Get help right away if:  You have thoughts of hurting yourself or others. If you ever feel like you may hurt yourself or others, or have thoughts about taking your own life, get help right away. You can go to your nearest emergency department or call:  Your local emergency services (911 in the [place]  A suicide crisis helpline, such as the [place] at [phone]. This is open 24-hours a day.  Summary  If you are living with  depression, there are ways to help you recover from it and also ways to prevent it from coming back.  Work with your health care team to create a management plan that includes counseling, stress management techniques, and healthy lifestyle habits. This information is not intended to replace advice given to you by your health care provider. Make sure you discuss any questions you have with your health care provider. Document Released: 06/29/2016 Document Revised: 06/29/2016 Document Reviewed: 06/29/2016 Elsevier Interactive Patient Education  [employer].

## 2018-02-07 NOTE — Progress Notes (Signed)
Subjective:    Patient ID: Rachel Dawson, female    DOB: [birth year], 37 y.o.   MRN: [id number]  No chief complaint on file.   HPI Patient was seen today for f/u.  Pt seen in ED on 6/27 for HA 2/2 HTN urgency.  Pt states she has been out of bp chlorthalidone x 1-2 months.  Pt states she thought she was eating better and did not need it.  Pt endorses increased stress recently at work, at home and with the death of her grandmother.  Pt has no energy, no interest in doing things, wants to sleep more, and is having difficulty sleeping 2/2 her mind racing.  Pt had a panic attack the other day while at home.  Denies SI/HI.  DM:  Pt states her fsbs in the morning has bene in the 80s and the highest during the day may be in the 200s.  Pt states she does not feel good when her bs is in the 80s.  Pt does not have an appetite as most things make her feel nauseous.  Pt followed by Endo, Dr. Cruzita Lederer.  Pt notes vaginal irritation and discharge.    Past Medical History:  Diagnosis Date  . History of blood transfusion    "when I had tonsils taken out & w/hyster"  . Hypertension   . Menometrorrhagia   . Microcytic anemia   . Type II diabetes mellitus (HCC)     Allergies  Allergen Reactions  . Latex Rash    ROS General: Denies fever, chills, night sweats, changes in weight, changes in appetite HEENT: Denies headaches, ear pain, changes in vision, rhinorrhea, sore throat  +HAs CV: Denies CP, palpitations, SOB, orthopnea Pulm: Denies SOB, cough, wheezing GI: Denies abdominal pain, nausea, vomiting, diarrhea, constipation GU: Denies dysuria, hematuria, frequency  +vaginal discharge Msk: Denies muscle cramps, joint pains Neuro: Denies weakness, numbness, tingling Skin: Denies rashes, bruising Psych: Denies hallucinations  +anxiety and depression     Objective:    Blood pressure (!) 200/104, pulse 66, temperature 98.9 F (37.2 C), temperature source Oral, weight 226 lb (102.5 kg), last menstrual  period 01/05/2011, SpO2 95 %.   Gen. Pleasant, well-nourished, in no distress, normal affect   HEENT: [place]/AT, face symmetric, no scleral icterus, PERRLA, nares patent without drainage Lungs: no accessory muscle use, CTAB, no wheezes or rales Cardiovascular: RRR, no m/r/g, no peripheral edema Abdomen: BS present, soft, NT/ND Neuro:  A&Ox3, CN II-XII intact, normal gait   Wt Readings from Last 3 Encounters:  02/07/18 226 lb (102.5 kg)  02/03/18 267 lb (121.1 kg)  07/27/17 266 lb (120.7 kg)    Lab Results  Component Value Date   WBC 8.9 02/03/2018   HGB 11.9 (L) 02/03/2018   HCT 37.8 02/03/2018   PLT 309 02/03/2018   GLUCOSE 140 (H) 02/03/2018   CHOL 201 (H) 04/01/2017   TRIG 111.0 04/01/2017   HDL 42.90 04/01/2017   LDLCALC 136 (H) 04/01/2017   ALT 16 04/01/2017   AST 12 04/01/2017   NA 136 02/03/2018   K 3.4 (L) 02/03/2018   CL 104 02/03/2018   CREATININE 0.78 02/03/2018   BUN 10 02/03/2018   CO2 23 02/03/2018   TSH 0.68 04/01/2017   INR 1.18 11/22/2014   HGBA1C 10.4 07/27/2017    Assessment/Plan:  Anxiety and depression -pt to complete PHQ9 and GAD 7 -discussed counseling.  Pt advised to make an appt. -pt to f/u in 1-2 days for further discussion after restarting bp meds.  Essential hypertension  -uncontrolled -pt advised to restart her medication immediately -discussed lifestyle modifications -pt to f/u in 1-2 days for bp recheck - Plan: chlorthalidone (HYGROTON) 50 MG tablet  Nausea  - Plan: omeprazole (PRILOSEC) 20 MG capsule  Candida infection  - Plan: fluconazole (DIFLUCAN) 150 MG tablet  F/u in 1-2 days  Grier Mitts, MD

## 2018-02-08 ENCOUNTER — Encounter: Payer: Self-pay | Admitting: Family Medicine

## 2018-02-08 DIAGNOSIS — F329 Major depressive disorder, single episode, unspecified: Secondary | ICD-10-CM | POA: Insufficient documentation

## 2018-02-08 DIAGNOSIS — F419 Anxiety disorder, unspecified: Secondary | ICD-10-CM | POA: Insufficient documentation

## 2018-02-09 ENCOUNTER — Encounter: Payer: Self-pay | Admitting: Family Medicine

## 2018-02-09 ENCOUNTER — Ambulatory Visit: Payer: 59 | Admitting: Family Medicine

## 2018-02-09 VITALS — BP 150/90 | Wt 226.0 lb

## 2018-02-09 DIAGNOSIS — F419 Anxiety disorder, unspecified: Secondary | ICD-10-CM | POA: Diagnosis not present

## 2018-02-09 DIAGNOSIS — I10 Essential (primary) hypertension: Secondary | ICD-10-CM | POA: Diagnosis not present

## 2018-02-09 DIAGNOSIS — E119 Type 2 diabetes mellitus without complications: Secondary | ICD-10-CM

## 2018-02-09 MED ORDER — HYDROXYZINE HCL 50 MG PO TABS: 50.0000 mg | ORAL_TABLET | Freq: Three times a day (TID) | ORAL | 0 refills | Status: DC | PRN

## 2018-02-09 NOTE — Patient Instructions (Signed)
Stress and Stress Management Stress is a normal reaction to life events. It is what you feel when life demands more than you are used to or more than you can handle. Some stress can be useful. For example, the stress reaction can help you catch the last bus of the day, study for a test, or meet a deadline at work. But stress that occurs too often or for too long can cause problems. It can affect your emotional health and interfere with relationships and normal daily activities. Too much stress can weaken your immune system and increase your risk for physical illness. If you already have a medical problem, stress can make it worse. What are the causes? All sorts of life events may cause stress. An event that causes stress for one person may not be stressful for another person. Major life events commonly cause stress. These may be positive or negative. Examples include losing your job, moving into a new home, getting married, having a baby, or losing a loved one. Less obvious life events may also cause stress, especially if they occur day after day or in combination. Examples include working long hours, driving in traffic, caring for children, being in debt, or being in a difficult relationship. What are the signs or symptoms? Stress may cause emotional symptoms including, the following:  Anxiety. This is feeling worried, afraid, on edge, overwhelmed, or out of control.  Anger. This is feeling irritated or impatient.  Depression. This is feeling sad, down, helpless, or guilty.  Difficulty focusing, remembering, or making decisions.  Stress may cause physical symptoms, including the following:  Aches and pains. These may affect your head, neck, back, stomach, or other areas of your body.  Tight muscles or clenched jaw.  Low energy or trouble sleeping.  Stress may cause unhealthy behaviors, including the following:  Eating to feel better (overeating) or skipping meals.  Sleeping too little,  too much, or both.  Working too much or putting off tasks (procrastination).  Smoking, drinking alcohol, or using drugs to feel better.  How is this diagnosed? Stress is diagnosed through an assessment by your health care provider. Your health care provider will ask questions about your symptoms and any stressful life events.Your health care provider will also ask about your medical history and may order blood tests or other tests. Certain medical conditions and medicine can cause physical symptoms similar to stress. Mental illness can cause emotional symptoms and unhealthy behaviors similar to stress. Your health care provider may refer you to a mental health professional for further evaluation. How is this treated? Stress management is the recommended treatment for stress.The goals of stress management are reducing stressful life events and coping with stress in healthy ways. Techniques for reducing stressful life events include the following:  Stress identification. Self-monitor for stress and identify what causes stress for you. These skills may help you to avoid some stressful events.  Time management. Set your priorities, keep a calendar of events, and learn to say "no." These tools can help you avoid making too many commitments.  Techniques for coping with stress include the following:  Rethinking the problem. Try to think realistically about stressful events rather than ignoring them or overreacting. Try to find the positives in a stressful situation rather than focusing on the negatives.  Exercise. Physical exercise can release both physical and emotional tension. The key is to find a form of exercise you enjoy and do it regularly.  Relaxation techniques. These relax the body and  mind. Examples include yoga, meditation, tai chi, biofeedback, deep breathing, progressive muscle relaxation, listening to music, being out in nature, journaling, and other hobbies. Again, the key is to find  one or more that you enjoy and can do regularly.  Healthy lifestyle. Eat a balanced diet, get plenty of sleep, and do not smoke. Avoid using alcohol or drugs to relax.  Strong support network. Spend time with family, friends, or other people you enjoy being around.Express your feelings and talk things over with someone you trust.  Counseling or talktherapy with a mental health professional may be helpful if you are having difficulty managing stress on your own. Medicine is typically not recommended for the treatment of stress.Talk to your health care provider if you think you need medicine for symptoms of stress. Follow these instructions at home:  Keep all follow-up visits as directed by your health care provider.  Take all medicines as directed by your health care provider. Contact a health care provider if:  Your symptoms get worse or you start having new symptoms.  You feel overwhelmed by your problems and can no longer manage them on your own. Get help right away if:  You feel like hurting yourself or someone else. This information is not intended to replace advice given to you by your health care provider. Make sure you discuss any questions you have with your health care provider. Document Released: 01/20/2001 Document Revised: 01/02/2016 Document Reviewed: 03/21/2013 Elsevier Interactive Patient Education  2017 [employer]  

## 2018-02-09 NOTE — Progress Notes (Signed)
Subjective:    Patient ID: Rachel Dawson, female    DOB: 1980/10/13, 37 y.o.   MRN: [id number]  No chief complaint on file.   HPI Patient was seen today for f/u on bp and anxiety.  HTN: -taking chlorthalidone 50 mg daily -Feeling better -Denies dizziness -Checking BP at home -Drinking more water  Anxiety: -Endorses continued stress -Patient notes tension in muscles in her neck and back. -Unable to keep her mind from racing while laying in bed. -Patient did make an appointment for counseling with the [facility] on Monday.  DM 2: -Patient taking Basaglar 22 units nightly and metformin XR 1000 mg twice daily -Patient notes waking up feeling bad.  FSBS was 84.  Ate peanuts and water.  Fsbs went up to 170s   Past Medical History:  Diagnosis Date  . History of blood transfusion    "when I had tonsils taken out & w/hyster"  . Hypertension   . Menometrorrhagia   . Microcytic anemia   . Type II diabetes mellitus (HCC)     Allergies  Allergen Reactions  . Latex Rash    ROS General: Denies fever, chills, night sweats, changes in weight, changes in appetite  +increased stress, difficulty sleeping HEENT: Denies headaches, ear pain, changes in vision, rhinorrhea, sore throat CV: Denies CP, palpitations, SOB, orthopnea Pulm: Denies SOB, cough, wheezing GI: Denies abdominal pain, nausea, vomiting, diarrhea, constipation GU: Denies dysuria, hematuria, frequency, vaginal discharge Msk: Denies muscle cramps, joint pains Neuro: Denies weakness, numbness, tingling Skin: Denies rashes, bruising Psych: Denies depression,hallucinations anxiety     Objective:    Blood pressure (!) 150/90, weight 226 lb (102.5 kg), last menstrual period 01/05/2011.   Gen. Pleasant, well-nourished, in no distress, normal affect  HEENT: [place]/AT, face symmetric, no scleral icterus, PERRLA,  nares patent without drainage Lungs: no accessory muscle use Cardiovascular: RRR, no peripheral edema Neuro:   A&Ox3, CN II-XII intact, normal gait  Wt Readings from Last 3 Encounters:  02/09/18 226 lb (102.5 kg)  02/07/18 226 lb (102.5 kg)  02/03/18 267 lb (121.1 kg)    Lab Results  Component Value Date   WBC 8.9 02/03/2018   HGB 11.9 (L) 02/03/2018   HCT 37.8 02/03/2018   PLT 309 02/03/2018   GLUCOSE 140 (H) 02/03/2018   CHOL 201 (H) 04/01/2017   TRIG 111.0 04/01/2017   HDL 42.90 04/01/2017   LDLCALC 136 (H) 04/01/2017   ALT 16 04/01/2017   AST 12 04/01/2017   NA 136 02/03/2018   K 3.4 (L) 02/03/2018   CL 104 02/03/2018   CREATININE 0.78 02/03/2018   BUN 10 02/03/2018   CO2 23 02/03/2018   TSH 0.68 04/01/2017   INR 1.18 11/22/2014   HGBA1C 10.4 07/27/2017    Assessment/Plan:  Essential hypertension -Elevated, but improving -Continue chlorthalidone 50 mg daily -Discussed will likely need additional medication such as ACE or ARB  -continue lifestyle modifications  -Discussed may see improvement in blood pressure by decreasing stress. -Follow-up in 1 to 2 weeks  Anxiety  -Will start counseling on Monday, encouraged this appointment - Plan: hydrOXYzine (ATARAX/VISTARIL) 50 MG tablet  Diabetes mellitus without complication ([place]) -discussed contacting Endo and making f/u appt if not already done -pt will decrease basaglar to 18 units qhs 2/2 hypoglycemia -Continue metformin XR 1000 mg twice daily -lifestyle modifications encouraged -pt to check fsbs and keep a log   F/u in 1-2 wks  Grier Mitts, MD

## 2018-02-23 ENCOUNTER — Ambulatory Visit: Payer: 59 | Admitting: Family Medicine

## 2018-02-23 ENCOUNTER — Encounter: Payer: Self-pay | Admitting: Family Medicine

## 2018-02-23 VITALS — BP 130/82 | HR 82 | Temp 99.1°F | Wt 226.0 lb

## 2018-02-23 DIAGNOSIS — I10 Essential (primary) hypertension: Secondary | ICD-10-CM

## 2018-02-23 DIAGNOSIS — Z794 Long term (current) use of insulin: Secondary | ICD-10-CM | POA: Diagnosis not present

## 2018-02-23 DIAGNOSIS — E1165 Type 2 diabetes mellitus with hyperglycemia: Secondary | ICD-10-CM | POA: Diagnosis not present

## 2018-02-23 LAB — BASIC METABOLIC PANEL
BUN: 15 mg/dL (ref 6–23)
CALCIUM: 9.4 mg/dL (ref 8.4–10.5)
CO2: 27 mEq/L (ref 19–32)
CREATININE: 1.04 mg/dL (ref 0.40–1.20)
Chloride: 98 mEq/L (ref 96–112)
GFR: 75.82 mL/min (ref >60.00)
Glucose, Bld: 285 mg/dL — ABNORMAL HIGH (ref 70–99)
Potassium: 3.6 mEq/L (ref 3.5–5.1)
Sodium: 134 mEq/L — ABNORMAL LOW (ref 135–145)

## 2018-02-23 LAB — HEMOGLOBIN A1C: HEMOGLOBIN A1C: 8.4 % — AB (ref 4.6–6.5)

## 2018-02-23 NOTE — Progress Notes (Signed)
Subjective:    Patient ID: Rachel Dawson, female    DOB: 29-Jun-1981, 37 y.o.   MRN: [id number]  No chief complaint on file.   HPI Patient was seen today for f/u on BP and DM.  HTN: -taking chlorthalidone 50 mg daily -pt states she is feeling better. -states bp has been "good" -pt is drinking more water, she is eating tofu, overall trying to do better. -pt also started therapy which went well.  She has another appt next wk.  DM II: -taking Metformin 1000 mg BID, basaglar 18 units qhs -pt states her fsbs has improved in the am, she does not feel bad anymore. -fsbs this am was 92  Past Medical History:  Diagnosis Date  . History of blood transfusion    "when I had tonsils taken out & w/hyster"  . Hypertension   . Menometrorrhagia   . Microcytic anemia   . Type II diabetes mellitus (HCC)     Allergies  Allergen Reactions  . Latex Rash    ROS General: Denies fever, chills, night sweats, changes in weight, changes in appetite HEENT: Denies headaches, ear pain, changes in vision, rhinorrhea, sore throat CV: Denies CP, palpitations, SOB, orthopnea Pulm: Denies SOB, cough, wheezing GI: Denies abdominal pain, nausea, vomiting, diarrhea, constipation GU: Denies dysuria, hematuria, frequency, vaginal discharge Msk: Denies muscle cramps, joint pains Neuro: Denies weakness, numbness, tingling Skin: Denies rashes, bruising Psych: Denies depression, anxiety, hallucinations     Objective:    Blood pressure 130/82, pulse 82, temperature 99.1 F (37.3 C), temperature source Oral, weight 226 lb (102.5 kg), last menstrual period 01/05/2011, SpO2 96 %.   Gen. Pleasant, well-nourished, in no distress, normal affect   HEENT: [place]/AT, face symmetric, no scleral icterus, PERRLA, nares patent without drainage Lungs: no accessory muscle use, CTAB, no wheezes or rales Cardiovascular: RRR, no m/r/g, no peripheral edema Neuro:  A&Ox3, CN II-XII intact, normal gait  Wt Readings from Last 3  Encounters:  02/23/18 226 lb (102.5 kg)  02/09/18 226 lb (102.5 kg)  02/07/18 226 lb (102.5 kg)    Lab Results  Component Value Date   WBC 8.9 02/03/2018   HGB 11.9 (L) 02/03/2018   HCT 37.8 02/03/2018   PLT 309 02/03/2018   GLUCOSE 140 (H) 02/03/2018   CHOL 201 (H) 04/01/2017   TRIG 111.0 04/01/2017   HDL 42.90 04/01/2017   LDLCALC 136 (H) 04/01/2017   ALT 16 04/01/2017   AST 12 04/01/2017   NA 136 02/03/2018   K 3.4 (L) 02/03/2018   CL 104 02/03/2018   CREATININE 0.78 02/03/2018   BUN 10 02/03/2018   CO2 23 02/03/2018   TSH 0.68 04/01/2017   INR 1.18 11/22/2014   HGBA1C 10.4 07/27/2017    Assessment/Plan:  Essential hypertension  -controlled -continue chlorthalidone 50 mg -will obtain labs as may need potassium supplementation - Plan: Basic metabolic panel  Type 2 diabetes mellitus with hyperglycemia, with long-term current use of insulin (HCC) -continue metformin 1000 mg BID and basaglar 20 units qhs - Plan: Hemoglobin A1c  F/u in 1 month  Grier Mitts, MD

## 2018-02-25 ENCOUNTER — Other Ambulatory Visit: Payer: Self-pay | Admitting: Family Medicine

## 2018-02-25 DIAGNOSIS — F419 Anxiety disorder, unspecified: Secondary | ICD-10-CM

## 2018-03-25 ENCOUNTER — Ambulatory Visit: Payer: 59 | Admitting: Family Medicine

## 2018-03-25 DIAGNOSIS — Z0289 Encounter for other administrative examinations: Secondary | ICD-10-CM

## 2018-03-27 ENCOUNTER — Other Ambulatory Visit: Payer: Self-pay | Admitting: Family Medicine

## 2018-03-27 DIAGNOSIS — R110 Nausea: Secondary | ICD-10-CM

## 2018-03-29 ENCOUNTER — Other Ambulatory Visit: Payer: Self-pay | Admitting: Family Medicine

## 2018-03-29 DIAGNOSIS — F419 Anxiety disorder, unspecified: Secondary | ICD-10-CM

## 2018-06-13 ENCOUNTER — Ambulatory Visit (INDEPENDENT_AMBULATORY_CARE_PROVIDER_SITE_OTHER): Payer: 59 | Admitting: Internal Medicine

## 2018-06-13 ENCOUNTER — Encounter: Payer: Self-pay | Admitting: Internal Medicine

## 2018-06-13 VITALS — BP 128/70 | HR 78 | Ht 67.0 in | Wt 270.0 lb

## 2018-06-13 DIAGNOSIS — E785 Hyperlipidemia, unspecified: Secondary | ICD-10-CM | POA: Diagnosis not present

## 2018-06-13 DIAGNOSIS — Z794 Long term (current) use of insulin: Secondary | ICD-10-CM | POA: Diagnosis not present

## 2018-06-13 DIAGNOSIS — Z6841 Body Mass Index (BMI) 40.0 and over, adult: Secondary | ICD-10-CM

## 2018-06-13 DIAGNOSIS — E1165 Type 2 diabetes mellitus with hyperglycemia: Secondary | ICD-10-CM | POA: Diagnosis not present

## 2018-06-13 LAB — LIPID PANEL
CHOL/HDL RATIO: 5
Cholesterol: 217 mg/dL — ABNORMAL HIGH (ref 0–200)
HDL: 43.1 mg/dL (ref >39.00)
NONHDL: 174.28
TRIGLYCERIDES: 206 mg/dL — AB (ref 0.0–149.0)
VLDL: 41.2 mg/dL — AB (ref 0.0–40.0)

## 2018-06-13 LAB — POCT GLYCOSYLATED HEMOGLOBIN (HGB A1C): Hemoglobin A1C: 8.7 % — AB (ref 4.0–5.6)

## 2018-06-13 LAB — LDL CHOLESTEROL, DIRECT: Direct LDL: 156 mg/dL

## 2018-06-13 MED ORDER — BASAGLAR KWIKPEN 100 UNIT/ML ~~LOC~~ SOPN: PEN_INJECTOR | SUBCUTANEOUS | 3 refills | Status: DC

## 2018-06-13 NOTE — Patient Instructions (Addendum)
Please continue: - Metformin ER 1000 mg 2x a day with meals - Glimepiride 4 mg before dinner  Please increase: - Basaglar to 28 units at bedtime  Please stop at the lab.  Please return in 3 months with your sugar log.

## 2018-06-13 NOTE — Progress Notes (Addendum)
Patient ID: Rachel Dawson, female   DOB: 06-04-1981, 37 y.o.   MRN: [id number]   HPI: Rachel Dawson is a 37 y.o.-year-old female, returning for follow-up for DM2, dx in 2014, insulin-dependent, uncontrolled, without long term complications (but with hyperglycemia, yeast infections).  Last visit 11 months ago.  Last hemoglobin A1c was: Lab Results  Component Value Date   HGBA1C 8.4 (H) 02/23/2018   HGBA1C 10.4 07/27/2017   HGBA1C 10.5 (H) 04/01/2017   Pt is on a regimen of: - Metformin ER 1000 mg 2x a day with meals - Glimepiride 4 mg before dinner - Basaglar 22 units at bedtime She was on Victoza >> pancreatitis.  Pt checks her sugars twice a day: - am: 164-360, usu. 200s >> 189-220 >> 125-154, 167 - 2h after b'fast: n/c - before lunch: n/c - 2h after lunch: n/c - before dinner: 140s >> 120-140s >> 135-156 - 2h after dinner: n/c - bedtime: n/c - nighttime: n/c Lowest sugar was 140 >> 120 >> 125; it is unclear at which level she has hypoglycemia awareness. Highest sugar was 383 >> 220 >> 220 - 1 mo ago.  Glucometer:  One Touch Ultra  Pt's meals are: - Breakfast: 2 [place] bacon and 2 boiled eggs - Lunch: sandwich + veggies + fruit - Dinner: chicken, pork, [place], fish + veggies + brown rice or spaghetti, salad; occasional fast food - Snacks: ginger ale 2x a week, water, tea; juice  -No CKD, last BUN/creatinine:  Lab Results  Component Value Date   BUN 15 02/23/2018   BUN 10 02/03/2018   CREATININE 1.04 02/23/2018   CREATININE 0.78 02/03/2018   -+ HL; last set of lipids: Lab Results  Component Value Date   CHOL 201 (H) 04/01/2017   HDL 42.90 04/01/2017   LDLCALC 136 (H) 04/01/2017   TRIG 111.0 04/01/2017   CHOLHDL 5 04/01/2017   - last eye exam was in 06/2017:?  DR - No numbness and tingling in her feet.  Pt has FH of DM in mother (?)  - pt adopted.  Started on Hydroxyzine and Omeprazole.  ROS: Constitutional: no weight gain/no weight loss, no fatigue, no  subjective hyperthermia, no subjective hypothermia Eyes: no blurry vision, no xerophthalmia ENT: no sore throat, no nodules palpated in neck, no dysphagia, no odynophagia, no hoarseness Cardiovascular: no CP/no SOB/no palpitations/no leg swelling Respiratory: no cough/no SOB/no wheezing Gastrointestinal: no N/no V/no D/no C/no acid reflux Musculoskeletal: no muscle aches/no joint aches Skin: no rashes, no hair loss Neurological: no tremors/no numbness/no tingling/no dizziness  I reviewed pt's medications, allergies, PMH, social hx, family hx, and changes were documented in the history of present illness. Otherwise, unchanged from my initial visit note.  Past Medical History:  Diagnosis Date  . History of blood transfusion    "when I had tonsils taken out & w/hyster"  . Hypertension   . Menometrorrhagia   . Microcytic anemia   . Type II diabetes mellitus ([place])    Past Surgical History:  Procedure Laterality Date  . ABDOMINAL HYSTERECTOMY  ~ 2012  . CESAREAN SECTION  1999; 2004; 2006  . HIP PINNING Bilateral ~ 1991   "balls had dropped out of their sockets"  . TONSILLECTOMY AND ADENOIDECTOMY    . TUBAL LIGATION  2006   Social History   Social History  . Marital status: Single    Spouse name: N/A  . Number of children: 2    Occupational History  . Lab tech   Social History Main  Topics  . Smoking status: Former Smoker    Packs/day: 0.12 - 2 cigs a day    Years: 4.00    Types: Cigarettes    Quit date: 07/10/2014  . Smokeless tobacco: Never Used  . Alcohol use Yes     Comment: 11/22/2014 "might drink a little a couple times/month, if that"  . Drug use: No  . Sexual activity: Not Currently    Birth control/ protection: Surgical   Current Outpatient Medications on File Prior to Visit  Medication Sig Dispense Refill  . blood glucose meter kit and supplies KIT Dispense based on patient and insurance preference. Use up to four times daily as directed. (FOR ICD-9 250.00,  250.01). 1 each 0  . Blood Pressure Monitoring (BLOOD PRESSURE MONITOR/L CUFF) MISC Check your blood pressure at the same time each day. 1 each 0  . chlorthalidone (HYGROTON) 50 MG tablet Take 1 tablet (50 mg total) by mouth daily. 90 tablet 1  . glimepiride (AMARYL) 2 MG tablet Take 2 tablets (4 mg total) by mouth daily before supper. 180 tablet 3  . hydrOXYzine (ATARAX/VISTARIL) 50 MG tablet TAKE 1 TABLET (50 MG TOTAL) BY MOUTH 3 (THREE) TIMES DAILY AS NEEDED FOR ANXIETY. 60 tablet 0  . Insulin Glargine (BASAGLAR KWIKPEN) 100 UNIT/ML SOPN Inject 0.22 mLs (22 Units total) into the skin at bedtime. 5 pen 3  . Insulin Glargine (BASAGLAR KWIKPEN) 100 UNIT/ML SOPN INJECT 8 UNITS INTO THE SKIN AT BEDTIME 15 pen 1  . Insulin Glargine (BASAGLAR KWIKPEN) 100 UNIT/ML SOPN INJECT 22 UNITS INTO THE SKIN AT BEDTIME 5 pen 3  . Insulin Pen Needle (BD PEN NEEDLE NANO U/F) 32G X 4 MM MISC Use one needle twice a day 100 each 3  . metFORMIN (GLUCOPHAGE-XR) 500 MG 24 hr tablet Take 2 tablets (1,000 mg total) by mouth 2 (two) times daily with a meal. 360 tablet 3  . omeprazole (PRILOSEC) 20 MG capsule TAKE 1 CAPSULE BY MOUTH EVERY DAY 90 capsule 0  . [DISCONTINUED] ferrous fumarate (HEMOCYTE - 106 MG FE) 325 (106 FE) MG TABS Take 1 tablet by mouth daily.      . [DISCONTINUED] medroxyPROGESTERone (PROVERA) 10 MG tablet Take 10 mg by mouth daily.       No current facility-administered medications on file prior to visit.    Allergies  Allergen Reactions  . Latex Rash   Family History  Adopted: Yes  Problem Relation Age of Onset  . Hypertension Daughter     PE: BP 128/70   Pulse 78   Ht _0  (1.702 m) Comment: measured  Wt 270 lb (122.5 kg)   LMP 01/05/2011   SpO2 98%   BMI 42.29 kg/m  Wt Readings from Last 3 Encounters:  06/13/18 270 lb (122.5 kg)  02/23/18 226 lb (102.5 kg)  02/09/18 226 lb (102.5 kg)   Constitutional: overweight, in NAD Eyes: PERRLA, EOMI, no exophthalmos ENT: moist mucous  membranes, no thyromegaly, no cervical lymphadenopathy Cardiovascular: RRR, No MRG Respiratory: CTA B Gastrointestinal: abdomen soft, NT, ND, BS+ Musculoskeletal: no deformities, strength intact in all 4 Skin: moist, warm, no rashes, + acanthosis nigricans on neck Neurological: no tremor with outstretched hands, DTR normal in all 4  ASSESSMENT: 1. DM2, insulin-dependent, uncontrolled, without long term complications, but with hyperglycemia  2. Obesity BMI Classification:  < 18.5 underweight   18.5-24.9 normal weight   25.0-29.9 overweight   30.0-34.9 class I obesity   35.0-39.9 class II obesity   ? 40.0  class III obesity   PLAN:  1. Patient with history of uncontrolled diabetes, on oral antidiabetic regimen and basal insulin.  She had nausea and diarrhea with regular metformin so she is not taking metformin ER.  We discussed at length about changing her diet by adding more veggies and fruit, legumes, seeds, eliminate saturated fats at last visit.  At last visit, sugars were higher due to stress at work and eating late at night.  We increased glimepiride and Basaglar.  She was then lost for follow-up for almost a year.  Latest HbA1c was better in 02/2018, at 8.4%. - sugars are better at this visit after she started to change her diet, but not quite at goal. Will increase Basaglar a little. Continue diet changes. Suggested to cut out juices. - I suggested to:  Patient Instructions  Please continue: - Metformin ER 1000 mg 2x a day with meals - Glimepiride 4 mg before dinner  Please increase: - Basaglar to 28 units at bedtime  Please stop at the lab.  Please return in 3 months with your sugar log.   - today, HbA1c is 8.7% (higher) - continue checking sugars at different times of the day - check 1x a day, rotating checks - advised for yearly eye exams >> she is UTD - + flu shot today - Return to clinic in 3 mo with sugar log   2. Obesity class 3 -She gained 4 net lbs since  last visit  - she does not feel her summer weight of 226 lbs was correct.  -We are limited in our options for diabetes medicines.  We cannot use a GLP-1 receptor agonist that she had pancreatitis from Victoza and she has frequent urination from chlorthalidone so we cannot use SGLT2 inhibitors.  Component     Latest Ref Rng & Units 06/13/2018  Cholesterol     0 - 200 mg/dL 217 (H)  Triglycerides     0.0 - 149.0 mg/dL 206.0 (H)  HDL Cholesterol     >39.00 mg/dL 43.10  VLDL     0.0 - 40.0 mg/dL 41.2 (H)  Total CHOL/HDL Ratio      5  NonHDL      174.28  Direct LDL     mg/dL 156.0  Lipid panel is worse.  Triglycerides are higher, HDL is higher.  I will suggest to start a statin.  I will advised her to use good contraception when taking it.  Fructosamine is pending.  Philemon Kingdom, MD PhD [facility] Endocr[facility]

## 2018-06-15 LAB — FRUCTOSAMINE: Fructosamine: 343 umol/L — ABNORMAL HIGH (ref 205–285)

## 2018-06-16 LAB — HM DIABETES EYE EXAM

## 2018-06-23 ENCOUNTER — Emergency Department (HOSPITAL_BASED_OUTPATIENT_CLINIC_OR_DEPARTMENT_OTHER)
Admission: EM | Admit: 2018-06-23 | Discharge: 2018-06-23 | Disposition: A | Discharge status: Home / Self Care | Payer: 59 | Attending: Emergency Medicine | Admitting: Emergency Medicine

## 2018-06-23 ENCOUNTER — Other Ambulatory Visit: Payer: Self-pay | Admitting: Family Medicine

## 2018-06-23 ENCOUNTER — Encounter (HOSPITAL_BASED_OUTPATIENT_CLINIC_OR_DEPARTMENT_OTHER): Payer: Self-pay | Admitting: *Deleted

## 2018-06-23 ENCOUNTER — Other Ambulatory Visit: Payer: Self-pay

## 2018-06-23 DIAGNOSIS — E119 Type 2 diabetes mellitus without complications: Secondary | ICD-10-CM | POA: Diagnosis not present

## 2018-06-23 DIAGNOSIS — M25512 Pain in left shoulder: Secondary | ICD-10-CM | POA: Diagnosis present

## 2018-06-23 DIAGNOSIS — Z794 Long term (current) use of insulin: Secondary | ICD-10-CM | POA: Diagnosis not present

## 2018-06-23 DIAGNOSIS — I10 Essential (primary) hypertension: Secondary | ICD-10-CM | POA: Diagnosis not present

## 2018-06-23 DIAGNOSIS — Z79899 Other long term (current) drug therapy: Secondary | ICD-10-CM | POA: Diagnosis not present

## 2018-06-23 LAB — BASIC METABOLIC PANEL
ANION GAP: 11 (ref 5–15)
BUN: 15 mg/dL (ref 6–20)
CALCIUM: 9.7 mg/dL (ref 8.9–10.3)
CO2: 25 mmol/L (ref 22–32)
Chloride: 99 mmol/L (ref 98–111)
Creatinine, Ser: 0.73 mg/dL (ref 0.44–1.00)
GFR calc Af Amer: >60 mL/min (ref >60)
GFR calc non Af Amer: >60 mL/min (ref >60)
GLUCOSE: 161 mg/dL — AB (ref 70–99)
Potassium: 3 mmol/L — ABNORMAL LOW (ref 3.5–5.1)
SODIUM: 135 mmol/L (ref 135–145)

## 2018-06-23 LAB — CBC WITH DIFFERENTIAL/PLATELET
Abs Immature Granulocytes: 0.02 10*3/uL (ref 0.00–0.07)
BASOS ABS: 0 10*3/uL (ref 0.0–0.1)
BASOS PCT: 0 %
EOS ABS: 0.1 10*3/uL (ref 0.0–0.5)
Eosinophils Relative: 1 %
HCT: 37.4 % (ref 36.0–46.0)
Hemoglobin: 12 g/dL (ref 12.0–15.0)
IMMATURE GRANULOCYTES: 0 %
Lymphocytes Relative: 24 %
Lymphs Abs: 2.4 10*3/uL (ref 0.7–4.0)
MCH: 27 pg (ref 26.0–34.0)
MCHC: 32.1 g/dL (ref 30.0–36.0)
MCV: 84.2 fL (ref 80.0–100.0)
Monocytes Absolute: 0.5 10*3/uL (ref 0.1–1.0)
Monocytes Relative: 5 %
NEUTROS ABS: 6.9 10*3/uL (ref 1.7–7.7)
NEUTROS PCT: 70 %
NRBC: 0 % (ref 0.0–0.2)
PLATELETS: 305 10*3/uL (ref 150–400)
RBC: 4.44 MIL/uL (ref 3.87–5.11)
RDW: 13.3 % (ref 11.5–15.5)
WBC: 10 10*3/uL (ref 4.0–10.5)

## 2018-06-23 LAB — TROPONIN I: Troponin I: <0.03 ng/mL (ref <0.03)

## 2018-06-23 MED ORDER — CYCLOBENZAPRINE HCL 10 MG PO TABS
10.0000 mg | ORAL_TABLET | Freq: Once | ORAL | Status: AC
  Administered 2018-06-23: 10 mg via ORAL
  Filled 2018-06-23: qty 1

## 2018-06-23 MED ORDER — CYCLOBENZAPRINE HCL 10 MG PO TABS: 10.0000 mg | ORAL_TABLET | Freq: Two times a day (BID) | ORAL | 0 refills | Status: DC | PRN

## 2018-06-23 NOTE — ED Triage Notes (Signed)
Left shoulder pain while at work today. No injury.

## 2018-06-23 NOTE — ED Provider Notes (Signed)
[facility] EMERGENCY DEPARTMENT Provider Note   CSN: [id number] Arrival date & time: 06/23/18  1815     History   Chief Complaint No chief complaint on file.   HPI Rachel Dawson is a 37 y.o. female.  HPI  Was at work and developed left arm pain Was trying to pop the top off of tubes Put ice back on the back of neck and taht seemed to help Feels tightness in left arm with radiation to neck No chest pain or tightness, no shortness of breath Was diaphoretic, no nausea Had tingling in left fingers when it started but not now No trauma or falls, has not been using arm more Has had similar pain in the past but went away on its ow Not worse with exertion No lightheadedness  Htn, DM Adopted does not know fam hx Used to smoke up until 2 weeks ago,   8/10 before, 5/10 now   Past Medical History:  Diagnosis Date  . History of blood transfusion    "when I had tonsils taken out & w/hyster"  . Hypertension   . Menometrorrhagia   . Microcytic anemia   . Type II diabetes mellitus [facility])     Patient Active Problem List   Diagnosis Date Noted  . Anxiety and depression 02/08/2018  . Pancreatitis, acute 11/23/2014  . Trichomonas vaginalis infection 11/23/2014  . Obesity 11/23/2014  . Steatohepatitis, non-alcoholic 42/70/6237  . Essential hypertension, benign 11/23/2014  . Transaminitis 11/22/2014  . Type 2 diabetes mellitus with hyperglycemia, with long-term current use of insulin ([place]) 11/22/2014  . Moderate dehydration 11/22/2014  . Nausea vomiting and diarrhea 11/22/2014  . Microcytic anemia   . Uterine fibroid   . Hypertension     Past Surgical History:  Procedure Laterality Date  . ABDOMINAL HYSTERECTOMY  ~ 2012  . CESAREAN SECTION  1999; 2004; 2006  . HIP PINNING Bilateral ~ 1991   "balls had dropped out of their sockets"  . TONSILLECTOMY AND ADENOIDECTOMY    . TUBAL LIGATION  2006     OB History   None      Home Medications    Prior to  Admission medications   Medication Sig Start Date End Date Taking? Authorizing Provider  blood glucose meter kit and supplies KIT Dispense based on patient and insurance preference. Use up to four times daily as directed. (FOR ICD-9 250.00, 250.01). 04/01/17   Billie Ruddy, MD  Blood Pressure Monitoring (BLOOD PRESSURE MONITOR/L CUFF) MISC Check your blood pressure at the same time each day. 04/15/17   Billie Ruddy, MD  chlorthalidone (HYGROTON) 50 MG tablet Take 1 tablet (50 mg total) by mouth daily. 02/07/18   Billie Ruddy, MD  cyclobenzaprine (FLEXERIL) 10 MG tablet Take 1 tablet (10 mg total) by mouth 2 (two) times daily as needed for muscle spasms. 06/23/18   Gareth Morgan, MD  glimepiride (AMARYL) 2 MG tablet Take 2 tablets (4 mg total) by mouth daily before supper. 07/27/17   Philemon Kingdom, MD  hydrOXYzine (ATARAX/VISTARIL) 50 MG tablet TAKE 1 TABLET (50 MG TOTAL) BY MOUTH 3 (THREE) TIMES DAILY AS NEEDED FOR ANXIETY. 03/31/18   Billie Ruddy, MD  Insulin Glargine (BASAGLAR KWIKPEN) 100 UNIT/ML SOPN INJECT 28 UNITS INTO THE SKIN AT BEDTIME 06/13/18   Philemon Kingdom, MD  Insulin Pen Needle (BD PEN NEEDLE NANO U/F) 32G X 4 MM MISC Use one needle twice a day 04/02/17   Billie Ruddy, MD  metFORMIN [facility])  500 MG 24 hr tablet Take 2 tablets (1,000 mg total) by mouth 2 (two) times daily with a meal. 05/28/17   Philemon Kingdom, MD  omeprazole (PRILOSEC) 20 MG capsule TAKE 1 CAPSULE BY MOUTH EVERY DAY 03/28/18   Billie Ruddy, MD  ONETOUCH VERIO test strip TEST UP TO 4 TIMES DAILY 06/23/18   Billie Ruddy, MD  ferrous fumarate (HEMOCYTE - 106 MG FE) 325 (106 FE) MG TABS Take 1 tablet by mouth daily.    10/28/11  [provider]  medroxyPROGESTERone (PROVERA) 10 MG tablet Take 10 mg by mouth daily.    10/28/11  [provider]    Family History Family History  Adopted: Yes  Problem Relation Age of Onset  . Hypertension Daughter     Social  History Social History   Tobacco Use  . Smoking status: Former Smoker    Packs/day: 0.12    Years: 4.00    Pack years: 0.48    Types: Cigarettes    Last attempt to quit: 07/10/2014    Years since quitting: 3.9  . Smokeless tobacco: Never Used  Substance Use Topics  . Alcohol use: Yes    Comment: occ  . Drug use: No     Allergies   Latex   Review of Systems Review of Systems  Constitutional: Positive for diaphoresis. Negative for fever.  Respiratory: Negative for shortness of breath.   Cardiovascular: Negative for chest pain.  Gastrointestinal: Negative for abdominal pain, nausea and vomiting.  Genitourinary: Negative for dysuria.  Musculoskeletal: Positive for myalgias and neck pain.  Skin: Negative for rash.  Neurological: Negative for facial asymmetry, speech difficulty, weakness, numbness (tingling left fingers improved) and headaches.     Physical Exam Updated Vital Signs BP (!) 152/91 (BP Location: Right Wrist)   Pulse 65   Temp 98.6 F (37 C) (Oral)   Resp 17   Ht _0  (1.702 m)   Wt 122.4 kg   LMP 01/05/2011   SpO2 100%   BMI 42.26 kg/m   Physical Exam  Constitutional: She is oriented to person, place, and time. She appears well-developed and well-nourished. No distress.  HENT:  Head: Normocephalic and atraumatic.  Eyes: Conjunctivae and EOM are normal.  Neck: Normal range of motion.  Cardiovascular: Normal rate, regular rhythm, normal heart sounds and intact distal pulses. Exam reveals no gallop and no friction rub.  No murmur heard. Pulmonary/Chest: Effort normal and breath sounds normal. No respiratory distress. She has no wheezes. She has no rales.  Abdominal: Soft. She exhibits no distension. There is no tenderness. There is no guarding.  Musculoskeletal: She exhibits no edema.       Left shoulder: She exhibits tenderness.       Cervical back: She exhibits tenderness.       Left upper arm: She exhibits tenderness.  Neurological: She is alert  and oriented to person, place, and time. She has normal strength. A sensory deficit (reports paresthesias shoulder, thumb, index finger) is present. Coordination normal. GCS eye subscore is 4. GCS verbal subscore is 5. GCS motor subscore is 6.  Skin: Skin is warm and dry. No rash noted. She is not diaphoretic. No erythema.  Nursing note and vitals reviewed.    ED Treatments / Results  Labs (all labs ordered are listed, but only abnormal results are displayed) Labs Reviewed  BASIC METABOLIC PANEL - Abnormal; Notable for the following components:      Result Value   Potassium 3.0 (*)  Glucose, Bld 161 (*)    All other components within normal limits  CBC WITH DIFFERENTIAL/PLATELET  TROPONIN I  TROPONIN I    EKG EKG Interpretation  Date/Time:  Thursday June 23 2018 18:51:44 EST Ventricular Rate:  58 PR Interval:    QRS Duration: 105 QT Interval:  432 QTC Calculation: 425 R Axis:   35 Text Interpretation:  Sinus rhythm No significant change since last tracing Confirmed by Gareth Morgan [phone]) on 06/23/2018 7:48:50 PM   Radiology No results found.  Procedures Procedures (including critical care time)  Medications Ordered in ED Medications  cyclobenzaprine (FLEXERIL) tablet 10 mg (10 mg Oral Given 06/23/18 1907)     Initial Impression / Assessment and Plan / ED Course  I have reviewed the triage vital signs and the nursing notes.  Pertinent labs & imaging results that were available during my care of the patient were reviewed by me and considered in my medical decision making (see chart for details).     37yo female with history of DM, htn, recently quit smoking presents with concern for spontaneous left shoulder and arm pain and tingling. Normal neuro exam with exception of paresthesias in dermatomal distribution, doubt CVA.  Given risk factors, report of diaphoresis, no change with movement obtained ekg, delta troponins completed. Troponins WNL. No sign of  DVT or acute arterial occlusion.   Given tenderness on exam, parasthesias suspect muscular strain or cervical radiculopathy. No weakness. Given rx for flexeril and recommend PCP follow up.    Final Clinical Impressions(s) / ED Diagnoses   Final diagnoses:  Acute pain of left shoulder    ED Discharge Orders         Ordered    cyclobenzaprine (FLEXERIL) 10 MG tablet  2 times daily PRN     06/23/18 2056           Gareth Morgan, MD 06/24/18 [phone]

## 2018-06-24 ENCOUNTER — Other Ambulatory Visit: Payer: Self-pay | Admitting: Family Medicine

## 2018-06-24 DIAGNOSIS — R110 Nausea: Secondary | ICD-10-CM

## 2018-07-05 ENCOUNTER — Emergency Department (HOSPITAL_COMMUNITY): Payer: 59

## 2018-07-05 ENCOUNTER — Encounter (HOSPITAL_COMMUNITY): Payer: Self-pay | Admitting: Emergency Medicine

## 2018-07-05 ENCOUNTER — Emergency Department (HOSPITAL_COMMUNITY)
Admission: EM | Admit: 2018-07-05 | Discharge: 2018-07-05 | Disposition: A | Discharge status: Home / Self Care | Payer: 59 | Attending: Emergency Medicine | Admitting: Emergency Medicine

## 2018-07-05 DIAGNOSIS — I10 Essential (primary) hypertension: Secondary | ICD-10-CM | POA: Diagnosis not present

## 2018-07-05 DIAGNOSIS — Z794 Long term (current) use of insulin: Secondary | ICD-10-CM | POA: Insufficient documentation

## 2018-07-05 DIAGNOSIS — F329 Major depressive disorder, single episode, unspecified: Secondary | ICD-10-CM | POA: Diagnosis not present

## 2018-07-05 DIAGNOSIS — R072 Precordial pain: Secondary | ICD-10-CM | POA: Diagnosis not present

## 2018-07-05 DIAGNOSIS — Z87891 Personal history of nicotine dependence: Secondary | ICD-10-CM | POA: Insufficient documentation

## 2018-07-05 DIAGNOSIS — E119 Type 2 diabetes mellitus without complications: Secondary | ICD-10-CM | POA: Insufficient documentation

## 2018-07-05 DIAGNOSIS — F419 Anxiety disorder, unspecified: Secondary | ICD-10-CM | POA: Diagnosis not present

## 2018-07-05 DIAGNOSIS — Z79899 Other long term (current) drug therapy: Secondary | ICD-10-CM | POA: Insufficient documentation

## 2018-07-05 DIAGNOSIS — R079 Chest pain, unspecified: Secondary | ICD-10-CM | POA: Diagnosis present

## 2018-07-05 DIAGNOSIS — R0789 Other chest pain: Secondary | ICD-10-CM | POA: Insufficient documentation

## 2018-07-05 DIAGNOSIS — Z91040 Latex allergy status: Secondary | ICD-10-CM | POA: Insufficient documentation

## 2018-07-05 HISTORY — DX: Pure hypercholesterolemia, unspecified: E78.00

## 2018-07-05 LAB — CBC
HCT: 37.5 % (ref 36.0–46.0)
HEMOGLOBIN: 11.8 g/dL — AB (ref 12.0–15.0)
MCH: 26.3 pg (ref 26.0–34.0)
MCHC: 31.5 g/dL (ref 30.0–36.0)
MCV: 83.7 fL (ref 80.0–100.0)
PLATELETS: 392 10*3/uL (ref 150–400)
RBC: 4.48 MIL/uL (ref 3.87–5.11)
RDW: 13 % (ref 11.5–15.5)
WBC: 9 10*3/uL (ref 4.0–10.5)
nRBC: 0 % (ref 0.0–0.2)

## 2018-07-05 LAB — BASIC METABOLIC PANEL
Anion gap: 10 (ref 5–15)
BUN: 18 mg/dL (ref 6–20)
CHLORIDE: 100 mmol/L (ref 98–111)
CO2: 27 mmol/L (ref 22–32)
Calcium: 9.8 mg/dL (ref 8.9–10.3)
Creatinine, Ser: 0.83 mg/dL (ref 0.44–1.00)
GFR calc Af Amer: >60 mL/min (ref >60)
GFR calc non Af Amer: >60 mL/min (ref >60)
GLUCOSE: 161 mg/dL — AB (ref 70–99)
POTASSIUM: 3.1 mmol/L — AB (ref 3.5–5.1)
Sodium: 137 mmol/L (ref 135–145)

## 2018-07-05 LAB — I-STAT TROPONIN, ED: TROPONIN I, POC: 0 ng/mL (ref 0.00–0.08)

## 2018-07-05 MED ORDER — POTASSIUM CHLORIDE CRYS ER 20 MEQ PO TBCR
40.0000 meq | EXTENDED_RELEASE_TABLET | Freq: Once | ORAL | Status: AC
  Administered 2018-07-05: 40 meq via ORAL
  Filled 2018-07-05: qty 2

## 2018-07-05 MED ORDER — ACETAMINOPHEN 500 MG PO TABS
1000.0000 mg | ORAL_TABLET | Freq: Once | ORAL | Status: AC
  Administered 2018-07-05: 1000 mg via ORAL
  Filled 2018-07-05: qty 2

## 2018-07-05 MED ORDER — IBUPROFEN 400 MG PO TABS
400.0000 mg | ORAL_TABLET | Freq: Once | ORAL | Status: AC
  Administered 2018-07-05: 400 mg via ORAL
  Filled 2018-07-05: qty 1

## 2018-07-05 NOTE — ED Triage Notes (Signed)
Reports left sided chest pain that started last night.  Describes as stabbing and sharp.  Has been intermittent.  Denies any n/v.

## 2018-07-05 NOTE — ED Provider Notes (Signed)
[facility] EMERGENCY DEPARTMENT Provider Note   CSN: [id number] Arrival date & time: 07/05/18  2057     History   Chief Complaint Chief Complaint  Patient presents with  . Chest Pain    HPI Rocklyn Mayberry is a 37 y.o. female.  Patient c/o mid chest pain since yesterday. Pain sharp, just lateral to sternum, non radiating, occurs at rest, not related to activity or exertion. Lasts a few seconds per episode. No constant/prolonged chest pain. No exertional chest pain. No associated nv, diaphoresis or sob. Pain is not pleuritic. No unusual doe or fatigue. Denies cough or uri symptoms. No fever or chills. Denies leg swelling or pain. No hx dvt or pe. fam hx unknown/adopted. Denies chest wall injury or strain. Denies heartburn.   The history is provided by the patient.  Chest Pain   Pertinent negatives include no abdominal pain, no back pain, no cough, no fever, no headaches, no palpitations and no shortness of breath.    Past Medical History:  Diagnosis Date  . History of blood transfusion    "when I had tonsils taken out & w/hyster"  . Hypercholesterolemia   . Hypertension   . Menometrorrhagia   . Microcytic anemia   . Type II diabetes mellitus [facility])     Patient Active Problem List   Diagnosis Date Noted  . Anxiety and depression 02/08/2018  . Pancreatitis, acute 11/23/2014  . Trichomonas vaginalis infection 11/23/2014  . Obesity 11/23/2014  . Steatohepatitis, non-alcoholic 40/98/1191  . Essential hypertension, benign 11/23/2014  . Transaminitis 11/22/2014  . Type 2 diabetes mellitus with hyperglycemia, with long-term current use of insulin ([place]) 11/22/2014  . Moderate dehydration 11/22/2014  . Nausea vomiting and diarrhea 11/22/2014  . Microcytic anemia   . Uterine fibroid   . Hypertension     Past Surgical History:  Procedure Laterality Date  . ABDOMINAL HYSTERECTOMY  ~ 2012  . CESAREAN SECTION  1999; 2004; 2006  . HIP PINNING Bilateral ~ 1991    "balls had dropped out of their sockets"  . TONSILLECTOMY AND ADENOIDECTOMY    . TUBAL LIGATION  2006     OB History   None      Home Medications    Prior to Admission medications   Medication Sig Start Date End Date Taking? Authorizing Provider  blood glucose meter kit and supplies KIT Dispense based on patient and insurance preference. Use up to four times daily as directed. (FOR ICD-9 250.00, 250.01). 04/01/17   Billie Ruddy, MD  Blood Pressure Monitoring (BLOOD PRESSURE MONITOR/L CUFF) MISC Check your blood pressure at the same time each day. 04/15/17   Billie Ruddy, MD  chlorthalidone (HYGROTON) 50 MG tablet Take 1 tablet (50 mg total) by mouth daily. 02/07/18   Billie Ruddy, MD  cyclobenzaprine (FLEXERIL) 10 MG tablet Take 1 tablet (10 mg total) by mouth 2 (two) times daily as needed for muscle spasms. 06/23/18   Gareth Morgan, MD  glimepiride (AMARYL) 2 MG tablet Take 2 tablets (4 mg total) by mouth daily before supper. 07/27/17   Philemon Kingdom, MD  hydrOXYzine (ATARAX/VISTARIL) 50 MG tablet TAKE 1 TABLET (50 MG TOTAL) BY MOUTH 3 (THREE) TIMES DAILY AS NEEDED FOR ANXIETY. 03/31/18   Billie Ruddy, MD  Insulin Glargine (BASAGLAR KWIKPEN) 100 UNIT/ML SOPN INJECT 28 UNITS INTO THE SKIN AT BEDTIME 06/13/18   Philemon Kingdom, MD  Insulin Pen Needle (BD PEN NEEDLE NANO U/F) 32G X 4 MM MISC Use one  needle twice a day 04/02/17   Billie Ruddy, MD  metFORMIN (GLUCOPHAGE-XR) 500 MG 24 hr tablet Take 2 tablets (1,000 mg total) by mouth 2 (two) times daily with a meal. 05/28/17   Philemon Kingdom, MD  omeprazole (PRILOSEC) 20 MG capsule TAKE 1 CAPSULE BY MOUTH EVERY DAY 06/24/18   Billie Ruddy, MD  ONETOUCH VERIO test strip TEST UP TO 4 TIMES DAILY 06/23/18   Billie Ruddy, MD  ferrous fumarate (HEMOCYTE - 106 MG FE) 325 (106 FE) MG TABS Take 1 tablet by mouth daily.    10/28/11  [provider]  medroxyPROGESTERone (PROVERA) 10 MG tablet Take 10 mg by  mouth daily.    10/28/11  [provider]    Family History Family History  Adopted: Yes  Problem Relation Age of Onset  . Hypertension Daughter     Social History Social History   Tobacco Use  . Smoking status: Former Smoker    Packs/day: 0.12    Years: 4.00    Pack years: 0.48    Types: Cigarettes    Last attempt to quit: 07/10/2014    Years since quitting: 3.9  . Smokeless tobacco: Never Used  Substance Use Topics  . Alcohol use: Yes    Comment: occ  . Drug use: No     Allergies   Latex   Review of Systems Review of Systems  Constitutional: Negative for fever.  HENT: Negative for sore throat.   Eyes: Negative for redness.  Respiratory: Negative for cough and shortness of breath.   Cardiovascular: Positive for chest pain. Negative for palpitations and leg swelling.  Gastrointestinal: Negative for abdominal pain.  Genitourinary: Negative for flank pain.  Musculoskeletal: Negative for back pain and neck pain.  Skin: Negative for rash.  Neurological: Negative for headaches.  Hematological: Does not bruise/bleed easily.  Psychiatric/Behavioral: Negative for confusion.     Physical Exam Updated Vital Signs BP (!) 151/87 (BP Location: Left Arm)   Pulse 60   Temp 98.2 F (36.8 C) (Oral)   Resp 18   Ht 1.702 m ('5\' 7"' )   Wt 118.8 kg   LMP 01/05/2011   SpO2 95%   BMI 41.04 kg/m   Physical Exam  Constitutional: She appears well-developed and well-nourished.  HENT:  Head: Atraumatic.  Eyes: Conjunctivae are normal. No scleral icterus.  Neck: Neck supple. No tracheal deviation present. No thyromegaly present.  Cardiovascular: Normal rate, regular rhythm, normal heart sounds and intact distal pulses. Exam reveals no gallop and no friction rub.  No murmur heard. Pulmonary/Chest: Effort normal and breath sounds normal. No respiratory distress. She exhibits tenderness.  Chest tenderness reproducing symptoms.   Abdominal: Soft. Normal appearance and  bowel sounds are normal. She exhibits no distension. There is no tenderness.  Musculoskeletal: She exhibits no edema or tenderness.  Neurological: She is alert.  Skin: Skin is warm and dry. No rash noted.  Psychiatric: She has a normal mood and affect.  Nursing note and vitals reviewed.    ED Treatments / Results  Labs (all labs ordered are listed, but only abnormal results are displayed) Results for orders placed or performed during the hospital encounter of 50/38/88  Basic metabolic panel  Result Value Ref Range   Sodium 137 135 - 145 mmol/L   Potassium 3.1 (L) 3.5 - 5.1 mmol/L   Chloride 100 98 - 111 mmol/L   CO2 27 22 - 32 mmol/L   Glucose, Bld 161 (H) 70 - 99 mg/dL  BUN 18 6 - 20 mg/dL   Creatinine, Ser 0.83 0.44 - 1.00 mg/dL   Calcium 9.8 8.9 - 10.3 mg/dL   GFR calc non Af Amer >60 >60 mL/min   GFR calc Af Amer >60 >60 mL/min   Anion gap 10 5 - 15  CBC  Result Value Ref Range   WBC 9.0 4.0 - 10.5 K/uL   RBC 4.48 3.87 - 5.11 MIL/uL   Hemoglobin 11.8 (L) 12.0 - 15.0 g/dL   HCT 37.5 36.0 - 46.0 %   MCV 83.7 80.0 - 100.0 fL   MCH 26.3 26.0 - 34.0 pg   MCHC 31.5 30.0 - 36.0 g/dL   RDW 13.0 11.5 - 15.5 %   Platelets 392 150 - 400 K/uL   nRBC 0.0 0.0 - 0.2 %  I-stat troponin, ED  Result Value Ref Range   Troponin i, poc 0.00 0.00 - 0.08 ng/mL   Comment 3           Dg Chest 2 View  Result Date: 07/05/2018 CLINICAL DATA:  37 y/o F; left-sided chest pain starting last night. Pain radiates to the left side of the neck. EXAM: CHEST - 2 VIEW COMPARISON:  03/18/2014 chest radiograph FINDINGS: Stable heart size and mediastinal contours are within normal limits. Both lungs are clear. The visualized skeletal structures are unremarkable. IMPRESSION: No acute pulmonary process identified. Electronically Signed   By: Kristine Garbe M.D.   On: 07/05/2018 21:50    EKG EKG Interpretation  Date/Time:  Tuesday July 05 2018 21:16:50 EST Ventricular Rate:  66 PR  Interval:  166 QRS Duration: 86 QT Interval:  426 QTC Calculation: 446 R Axis:   48 Text Interpretation:  Normal sinus rhythm Normal ECG Confirmed by Lajean Saver [phone]) on 07/05/2018 9:59:06 PM   Radiology Dg Chest 2 View  Result Date: 07/05/2018 CLINICAL DATA:  37 y/o F; left-sided chest pain starting last night. Pain radiates to the left side of the neck. EXAM: CHEST - 2 VIEW COMPARISON:  03/18/2014 chest radiograph FINDINGS: Stable heart size and mediastinal contours are within normal limits. Both lungs are clear. The visualized skeletal structures are unremarkable. IMPRESSION: No acute pulmonary process identified. Electronically Signed   By: Kristine Garbe M.D.   On: 07/05/2018 21:50    Procedures Procedures (including critical care time)  Medications Ordered in ED Medications  potassium chloride SA (K-DUR,KLOR-CON) CR tablet 40 mEq (has no administration in time range)  ibuprofen (ADVIL,MOTRIN) tablet 400 mg (has no administration in time range)  acetaminophen (TYLENOL) tablet 1,000 mg (has no administration in time range)     Initial Impression / Assessment and Plan / ED Course  I have reviewed the triage vital signs and the nursing notes.  Pertinent labs & imaging results that were available during my care of the patient were reviewed by me and considered in my medical decision making (see chart for details).  Labs. Ecg. Cxr.  Reviewed nursing notes and prior charts for additional history.   Labs reviewed - after symptoms since yesterday, trop normal. Symptoms very atypical, lasting a few seconds at rest. +chest wall tenderness. ?possible costochondritis.   No meds pta. Motrin po.   Labs reviewed - k mildly low. kcl po.  Pt currently appears stable for d/c.   Return precautions provided.    Final Clinical Impressions(s) / ED Diagnoses   Final diagnoses:  None    ED Discharge Orders    None       Lajean Saver,  MD 07/05/18 2232

## 2018-07-05 NOTE — Discharge Instructions (Addendum)
It was our pleasure to provide your ER care today - we hope that you feel better.  Take motrin or aleve as need.  Follow up with primary care doctor in the coming week - have your blood pressure rechecked then, as mildly high tonight. Also, your potassium is mildly low (3) - eat plenty of fruits and vegetables, and follow up with your doctor in 1 week.  Return to ER if worse, new symptoms, fevers, recurrent/persistent chest pain, trouble breathing, other concern.

## 2018-07-12 ENCOUNTER — Other Ambulatory Visit: Payer: Self-pay | Admitting: Internal Medicine

## 2018-07-14 ENCOUNTER — Encounter: Payer: Self-pay | Admitting: Family Medicine

## 2018-07-14 ENCOUNTER — Encounter: Payer: Self-pay | Admitting: Internal Medicine

## 2018-07-14 ENCOUNTER — Other Ambulatory Visit: Payer: Self-pay

## 2018-07-14 ENCOUNTER — Ambulatory Visit (INDEPENDENT_AMBULATORY_CARE_PROVIDER_SITE_OTHER): Payer: 59 | Admitting: Family Medicine

## 2018-07-14 VITALS — BP 110/70 | HR 64 | Temp 98.9°F | Wt 263.0 lb

## 2018-07-14 DIAGNOSIS — E1165 Type 2 diabetes mellitus with hyperglycemia: Secondary | ICD-10-CM | POA: Diagnosis not present

## 2018-07-14 DIAGNOSIS — Z794 Long term (current) use of insulin: Secondary | ICD-10-CM | POA: Diagnosis not present

## 2018-07-14 DIAGNOSIS — E876 Hypokalemia: Secondary | ICD-10-CM

## 2018-07-14 DIAGNOSIS — I10 Essential (primary) hypertension: Secondary | ICD-10-CM | POA: Diagnosis not present

## 2018-07-14 MED ORDER — POTASSIUM CHLORIDE ER 10 MEQ PO CPCR: 20.0000 meq | ORAL_CAPSULE | Freq: Every day | ORAL | 3 refills | Status: DC

## 2018-07-14 NOTE — Progress Notes (Signed)
Subjective:    Patient ID: Rachel Dawson, female    DOB: 1980/11/06, 37 y.o.   MRN: [id number]  No chief complaint on file. Internet/computer system down at time of visit.  Below note as recorded from paper chart.  HPI Patient was seen today for ED follow-up.  Pt seen in the ED on 07/05/2018 for chest pain and pain radiating down left arm.  Per pt EKG, CXR, and Trpn negative. Has been taking muscle relaxer, Motrin/Aleve, ice for cervical strain.  Trying not to take anxiety medication.    HTN: -Taking chlorthalidone 50 mg daily -Notes occasional muscle cramps -Hypokalemia noted in ED  Nicotine use: -quit smoking cold [place] a few weeks ago.   Has also stopped drinking alcohol and coffee.    DM type II: -BS improving.  -Seeing endo.  -Insulin recently increased.  -Fructosamine level obtained as hgb A1C was higher than expected.  Past Medical History:  Diagnosis Date  . History of blood transfusion    "when I had tonsils taken out & w/hyster"  . Hypercholesterolemia   . Hypertension   . Menometrorrhagia   . Microcytic anemia   . Type II diabetes mellitus ([facility])     Allergies  Allergen Reactions  . Latex Rash    ROS General: Denies fever, chills, night sweats, changes in weight, changes in appetite HEENT: Denies headaches, ear pain, changes in vision, rhinorrhea, sore throat CV: Denies CP, palpitations, SOB, orthopnea Pulm: Denies SOB, cough, wheezing GI: Denies abdominal pain, nausea, vomiting, diarrhea, constipation GU: Denies dysuria, hematuria, frequency, vaginal discharge Msk: Denies muscle cramps, joint pains  + muscle strain Neuro: Denies weakness, numbness, tingling Skin: Denies rashes, bruising Psych: Denies depression, hallucinations + anxiety     Objective:    Blood pressure 110/70, pulse 64, temperature 98.9 F (37.2 C), weight 263 lb (119.3 kg), last menstrual period 01/05/2011, SpO2 97 %.  Gen. Pleasant, well-nourished, in no distress, normal affect    HEENT: [place]/AT, face symmetric, no scleral icterus, PERRLA,  nares patent without drainage Lungs: no accessory muscle use, CTAB, no wheezes or rales Cardiovascular: RRR, no m/r/g, no peripheral edema Neuro:  A&Ox3, CN II-XII intact, normal gait   Wt Readings from Last 3 Encounters:  07/05/18 262 lb (118.8 kg)  06/23/18 269 lb 13.5 oz (122.4 kg)  06/13/18 270 lb (122.5 kg)    Lab Results  Component Value Date   WBC 9.0 07/05/2018   HGB 11.8 (L) 07/05/2018   HCT 37.5 07/05/2018   PLT 392 07/05/2018   GLUCOSE 161 (H) 07/05/2018   CHOL 217 (H) 06/13/2018   TRIG 206.0 (H) 06/13/2018   HDL 43.10 06/13/2018   LDLDIRECT 156.0 06/13/2018   LDLCALC 136 (H) 04/01/2017   ALT 16 04/01/2017   AST 12 04/01/2017   NA 137 07/05/2018   K 3.1 (L) 07/05/2018   CL 100 07/05/2018   CREATININE 0.83 07/05/2018   BUN 18 07/05/2018   CO2 27 07/05/2018   TSH 0.68 04/01/2017   INR 1.18 11/22/2014   HGBA1C 8.7 (A) 06/13/2018    Assessment/Plan:  Hypokalemia -Paper Rx given for Micro-K 20 mEq during computer downtime -Likely 2/2 chlorthalidone 50 mg use.  Essential hypertension -Controlled -Continue chlorthalidone 50 mg -Continue lifestyle modifications -Encouraged to check BP at home  Type 2 diabetes mellitus with hyperglycemia, with long-term current use of insulin ([facility]) -Continue glimepiride 2 mg, Basaglar 28 units nightly, metformin 1000 mg twice daily -Continue checking FSBS at home -Continue lifestyle modifications -Continue following with endocrinology  Follow-up PRN in the next few months  Grier Mitts, MD

## 2018-07-18 ENCOUNTER — Emergency Department (HOSPITAL_COMMUNITY)
Admission: EM | Admit: 2018-07-18 | Discharge: 2018-07-18 | Disposition: A | Discharge status: Home / Self Care | Payer: 59 | Attending: Emergency Medicine | Admitting: Emergency Medicine

## 2018-07-18 ENCOUNTER — Encounter: Payer: Self-pay | Admitting: Family Medicine

## 2018-07-18 ENCOUNTER — Encounter (HOSPITAL_COMMUNITY): Payer: Self-pay | Admitting: Emergency Medicine

## 2018-07-18 ENCOUNTER — Other Ambulatory Visit: Payer: Self-pay

## 2018-07-18 ENCOUNTER — Encounter: Payer: Self-pay | Admitting: Neurology

## 2018-07-18 DIAGNOSIS — Z91040 Latex allergy status: Secondary | ICD-10-CM | POA: Insufficient documentation

## 2018-07-18 DIAGNOSIS — Z87891 Personal history of nicotine dependence: Secondary | ICD-10-CM | POA: Diagnosis not present

## 2018-07-18 DIAGNOSIS — E119 Type 2 diabetes mellitus without complications: Secondary | ICD-10-CM | POA: Diagnosis not present

## 2018-07-18 DIAGNOSIS — R51 Headache: Secondary | ICD-10-CM | POA: Diagnosis present

## 2018-07-18 DIAGNOSIS — Z79899 Other long term (current) drug therapy: Secondary | ICD-10-CM | POA: Insufficient documentation

## 2018-07-18 DIAGNOSIS — I10 Essential (primary) hypertension: Secondary | ICD-10-CM | POA: Insufficient documentation

## 2018-07-18 DIAGNOSIS — Z794 Long term (current) use of insulin: Secondary | ICD-10-CM | POA: Diagnosis not present

## 2018-07-18 DIAGNOSIS — R519 Headache, unspecified: Secondary | ICD-10-CM

## 2018-07-18 DIAGNOSIS — E7800 Pure hypercholesterolemia, unspecified: Secondary | ICD-10-CM | POA: Diagnosis not present

## 2018-07-18 MED ORDER — SODIUM CHLORIDE 0.9 % IV BOLUS
1000.0000 mL | Freq: Once | INTRAVENOUS | Status: AC
  Administered 2018-07-18: 1000 mL via INTRAVENOUS

## 2018-07-18 MED ORDER — DIPHENHYDRAMINE HCL 50 MG/ML IJ SOLN
25.0000 mg | Freq: Once | INTRAMUSCULAR | Status: AC
  Administered 2018-07-18: 25 mg via INTRAVENOUS
  Filled 2018-07-18: qty 1

## 2018-07-18 MED ORDER — METOCLOPRAMIDE HCL 5 MG/ML IJ SOLN
10.0000 mg | Freq: Once | INTRAMUSCULAR | Status: AC
  Administered 2018-07-18: 10 mg via INTRAVENOUS
  Filled 2018-07-18: qty 2

## 2018-07-18 MED ORDER — KETOROLAC TROMETHAMINE 30 MG/ML IJ SOLN
30.0000 mg | Freq: Once | INTRAMUSCULAR | Status: AC
  Administered 2018-07-18: 30 mg via INTRAVENOUS
  Filled 2018-07-18: qty 1

## 2018-07-18 NOTE — ED Triage Notes (Signed)
Pt reports a headache that has been going on since 2100 hrs last might. Pt also reports dizziness and nausea. Pt states laying down causes the headache to get worse.

## 2018-07-18 NOTE — ED Notes (Signed)
Patient verbalizes understanding of discharge instructions. Opportunity for questioning and answers were provided. Armband removed by staff, pt discharged from ED home with family via [place].

## 2018-07-18 NOTE — ED Provider Notes (Signed)
[facility] DEPARTMENT Provider Note   CSN: [id number] Arrival date & time: 07/18/18  0351     History   Chief Complaint Chief Complaint  Patient presents with  . Migraine    HPI Rachel Dawson is a 37 y.o. female.  Patient presents to the emergency department with a chief complaint of headache.  She states that the headache started at around 9 PM last night and has gradually worsened through the night.  She states that it is mostly located on the right side of her head.  She also complains of some left upper neck and upper trapezius muscle pain.  She reports trying Aleve with no relief.  Denies any numbness, weakness, or tingling in her extremities.  Denies any vision changes or slurred speech.  She states that she did feel nauseated and slightly dizzy.  Denies history of migraines.  Denies any fever or chills.  The history is provided by the patient. No language interpreter was used.    Past Medical History:  Diagnosis Date  . History of blood transfusion    "when I had tonsils taken out & w/hyster"  . Hypercholesterolemia   . Hypertension   . Menometrorrhagia   . Microcytic anemia   . Type II diabetes mellitus [facility])     Patient Active Problem List   Diagnosis Date Noted  . Anxiety and depression 02/08/2018  . Pancreatitis, acute 11/23/2014  . Trichomonas vaginalis infection 11/23/2014  . Obesity 11/23/2014  . Steatohepatitis, non-alcoholic 35/00/9381  . Essential hypertension, benign 11/23/2014  . Transaminitis 11/22/2014  . Type 2 diabetes mellitus with hyperglycemia, with long-term current use of insulin ([facility]) 11/22/2014  . Moderate dehydration 11/22/2014  . Nausea vomiting and diarrhea 11/22/2014  . Microcytic anemia   . Uterine fibroid   . Hypertension     Past Surgical History:  Procedure Laterality Date  . ABDOMINAL HYSTERECTOMY  ~ 2012  . CESAREAN SECTION  1999; 2004; 2006  . HIP PINNING Bilateral ~ 1991   "balls had  dropped out of their sockets"  . TONSILLECTOMY AND ADENOIDECTOMY    . TUBAL LIGATION  2006     OB History   None      Home Medications    Prior to Admission medications   Medication Sig Start Date End Date Taking? Authorizing Provider  blood glucose meter kit and supplies KIT Dispense based on patient and insurance preference. Use up to four times daily as directed. (FOR ICD-9 250.00, 250.01). 04/01/17   Billie Ruddy, MD  Blood Pressure Monitoring (BLOOD PRESSURE MONITOR/L CUFF) MISC Check your blood pressure at the same time each day. 04/15/17   Billie Ruddy, MD  chlorthalidone (HYGROTON) 50 MG tablet Take 1 tablet (50 mg total) by mouth daily. 02/07/18   Billie Ruddy, MD  cyclobenzaprine (FLEXERIL) 10 MG tablet Take 1 tablet (10 mg total) by mouth 2 (two) times daily as needed for muscle spasms. 06/23/18   Gareth Morgan, MD  glimepiride (AMARYL) 2 MG tablet TAKE 1 TABLET BY MOUTH DAILY BEFORE SUPPER. 07/13/18   Philemon Kingdom, MD  hydrOXYzine (ATARAX/VISTARIL) 50 MG tablet TAKE 1 TABLET (50 MG TOTAL) BY MOUTH 3 (THREE) TIMES DAILY AS NEEDED FOR ANXIETY. 03/31/18   Billie Ruddy, MD  Insulin Glargine (BASAGLAR KWIKPEN) 100 UNIT/ML SOPN INJECT 28 UNITS INTO THE SKIN AT BEDTIME 06/13/18   Philemon Kingdom, MD  Insulin Pen Needle (BD PEN NEEDLE NANO U/F) 32G X 4 MM MISC Use one needle  twice a day 04/02/17   Billie Ruddy, MD  metFORMIN (GLUCOPHAGE-XR) 500 MG 24 hr tablet Take 2 tablets (1,000 mg total) by mouth 2 (two) times daily with a meal. 05/28/17   Philemon Kingdom, MD  omeprazole (PRILOSEC) 20 MG capsule TAKE 1 CAPSULE BY MOUTH EVERY DAY 06/24/18   Billie Ruddy, MD  ONETOUCH VERIO test strip TEST UP TO 4 TIMES DAILY 06/23/18   Billie Ruddy, MD  potassium chloride (MICRO-K) 10 MEQ CR capsule Take 2 capsules (20 mEq total) by mouth daily. 07/14/18   Billie Ruddy, MD  ferrous fumarate (HEMOCYTE - 106 MG FE) 325 (106 FE) MG TABS Take 1 tablet by mouth daily.     10/28/11  [provider]  medroxyPROGESTERone (PROVERA) 10 MG tablet Take 10 mg by mouth daily.    10/28/11  [provider]    Family History Family History  Adopted: Yes  Problem Relation Age of Onset  . Hypertension Daughter     Social History Social History   Tobacco Use  . Smoking status: Former Smoker    Packs/day: 0.12    Years: 4.00    Pack years: 0.48    Types: Cigarettes    Last attempt to quit: 07/10/2014    Years since quitting: 4.0  . Smokeless tobacco: Never Used  Substance Use Topics  . Alcohol use: Yes    Comment: occ  . Drug use: No     Allergies   Latex   Review of Systems Review of Systems  All other systems reviewed and are negative.    Physical Exam Updated Vital Signs BP (!) 146/96 (BP Location: Right Arm)   Pulse 69   Temp 98.4 F (36.9 C) (Oral)   Resp 18   Ht '5\' 7"'  (1.702 m)   Wt 118.8 kg   LMP 01/05/2011 (Exact Date)   SpO2 100%   BMI 41.04 kg/m   Physical Exam  Constitutional: She is oriented to person, place, and time. She appears well-developed and well-nourished.  HENT:  Head: Normocephalic and atraumatic.  Right Ear: External ear normal.  Left Ear: External ear normal.  Eyes: Pupils are equal, round, and reactive to light. Conjunctivae and EOM are normal.  Neck: Normal range of motion. Neck supple.  No pain with neck flexion, no meningismus  Cardiovascular: Normal rate, regular rhythm and normal heart sounds. Exam reveals no gallop and no friction rub.  No murmur heard. Pulmonary/Chest: Effort normal and breath sounds normal. No respiratory distress. She has no wheezes. She has no rales. She exhibits no tenderness.  Abdominal: Soft. She exhibits no distension and no mass. There is no tenderness. There is no rebound and no guarding.  Musculoskeletal: Normal range of motion. She exhibits no edema or tenderness.  Normal gait.  Neurological: She is alert and oriented to person, place, and time. She has  normal reflexes.  CN 3-12 intact, normal finger to nose, no pronator drift, sensation and strength intact bilaterally.  Skin: Skin is warm and dry.  Psychiatric: She has a normal mood and affect. Her behavior is normal. Judgment and thought content normal.  Nursing note and vitals reviewed.    ED Treatments / Results  Labs (all labs ordered are listed, but only abnormal results are displayed) Labs Reviewed - No data to display  EKG None  Radiology No results found.  Procedures Procedures (including critical care time)  Medications Ordered in ED Medications  ketorolac (TORADOL) 30 MG/ML injection 30 mg (has  no administration in time range)  metoCLOPramide (REGLAN) injection 10 mg (has no administration in time range)  diphenhydrAMINE (BENADRYL) injection 25 mg (has no administration in time range)  sodium chloride 0.9 % bolus 1,000 mL (has no administration in time range)     Initial Impression / Assessment and Plan / ED Course  I have reviewed the triage vital signs and the nursing notes.  Pertinent labs & imaging results that were available during my care of the patient were reviewed by me and considered in my medical decision making (see chart for details).    Pt HA treated and improved while in ED.  Presentation is inconsistent with SAH, ICH, Meningitis, or temporal arteritis. Pt is afebrile with no focal neuro deficits, nuchal rigidity, or change in vision. Pt is to follow up with PCP to discuss prophylactic medication. Pt verbalizes understanding and is agreeable with plan to dc.    Final Clinical Impressions(s) / ED Diagnoses   Final diagnoses:  Acute nonintractable headache, unspecified headache type    ED Discharge Orders    None       Montine Circle, PA-C 07/18/18 0547    Fatima Blank, MD 07/18/18 [phone]

## 2018-07-18 NOTE — ED Notes (Signed)
Pt woke up at 11:30pm with severe R sided headache, neck pain, nausea, and dizziness.  Denies history of migraines.  Pt states she got up to shower around 8:45pm and felt fine.  Mild headache started a little afterwards and she took Aleve and went back to bed.  VAN negative.  No arm drift.

## 2018-07-20 ENCOUNTER — Telehealth: Payer: Self-pay

## 2018-07-20 NOTE — Telephone Encounter (Signed)
Spoke with pt, states that she went to the ED for headaches, pt states that she has an appointment in February 2020 with an neurologist. Pt states that she is still having the headache and she needs medication to relief the pain until she sees the neurologist, Pt is scheduled to see dr Volanda Napoleon on 07/21/2018

## 2018-07-21 ENCOUNTER — Encounter: Payer: Self-pay | Admitting: Family Medicine

## 2018-07-21 ENCOUNTER — Ambulatory Visit: Payer: 59 | Admitting: Family Medicine

## 2018-07-21 VITALS — BP 110/68 | HR 66 | Temp 98.1°F

## 2018-07-21 DIAGNOSIS — G43009 Migraine without aura, not intractable, without status migrainosus: Secondary | ICD-10-CM | POA: Diagnosis not present

## 2018-07-21 MED ORDER — RIZATRIPTAN BENZOATE 5 MG PO TABS: 5.0000 mg | ORAL_TABLET | ORAL | 0 refills | Status: DC | PRN

## 2018-07-21 MED ORDER — ALPRAZOLAM 0.25 MG PO TABS: ORAL_TABLET | ORAL | 0 refills | Status: DC

## 2018-07-21 NOTE — Progress Notes (Signed)
Subjective:    Patient ID: Rachel Dawson, female    DOB: 1980/10/25, 37 y.o.   MRN: [id number]  No chief complaint on file.   HPI Patient was seen today for ED f/u.  Pt notes having a HA that felt like "a mack truck going through" her head with light and sound sensitivity.  Pt was given msk relaxer, Toradol, and benadryl.  Pt has a neurology appt schedule in February.  Pt notes continued HAs with R sided temporal pain with radiation in her neck.  Pt states she has a "flutter/woosh" behind her R ear.  Pt denies changes in mediations.  Pt does work in a [employer], doesn't use the hood.  Pt expresses frustration regarding her health.  Pt made changes including eating better and quitting smoking, but feels worse.  Pt notes always having negative EKGs and troponins but still feeling a sharp pain in her chest.    Past Medical History:  Diagnosis Date  . History of blood transfusion    "when I had tonsils taken out & w/hyster"  . Hypercholesterolemia   . Hypertension   . Menometrorrhagia   . Microcytic anemia   . Type II diabetes mellitus ([facility])     Allergies  Allergen Reactions  . Latex Rash    ROS General: Denies fever, chills, night sweats, changes in weight, changes in appetite HEENT: Denies ear pain, changes in vision, rhinorrhea, sore throat  +HAs CV: Denies CP, palpitations, SOB, orthopnea Pulm: Denies SOB, cough, wheezing GI: Denies abdominal pain, nausea, vomiting, diarrhea, constipation GU: Denies dysuria, hematuria, frequency, vaginal discharge Msk: Denies muscle cramps, joint pains Neuro: Denies weakness, numbness, tingling Skin: Denies rashes, bruising Psych: Denies depression, hallucinations  +anxiety    Objective:    Blood pressure 110/68, pulse 66, temperature 98.1 F (36.7 C), last menstrual period 01/05/2011, SpO2 97 %.  Gen. Pleasant, well-nourished, in no distress, normal affect   HEENT: [place]/AT, face symmetric, conjunctiva clear, no scleral icterus,  PERRLA, EOMI, no nystagmus, nares patent without drainage, pharynx without erythema or exudate. Lungs: no accessory muscle use, CTAB, no wheezes or rales Cardiovascular: RRR, no m/r/g, no peripheral edema Musculoskeletal: No deformities, no cyanosis or clubbing, normal tone, normal strength in upper and lower extremities bilaterally. Neuro:  A&Ox3, CN II-XII intact, normal gait   Wt Readings from Last 3 Encounters:  07/18/18 262 lb (118.8 kg)  07/14/18 263 lb (119.3 kg)  07/05/18 262 lb (118.8 kg)    Lab Results  Component Value Date   WBC 9.0 07/05/2018   HGB 11.8 (L) 07/05/2018   HCT 37.5 07/05/2018   PLT 392 07/05/2018   GLUCOSE 161 (H) 07/05/2018   CHOL 217 (H) 06/13/2018   TRIG 206.0 (H) 06/13/2018   HDL 43.10 06/13/2018   LDLDIRECT 156.0 06/13/2018   LDLCALC 136 (H) 04/01/2017   ALT 16 04/01/2017   AST 12 04/01/2017   NA 137 07/05/2018   K 3.1 (L) 07/05/2018   CL 100 07/05/2018   CREATININE 0.83 07/05/2018   BUN 18 07/05/2018   CO2 27 07/05/2018   TSH 0.68 04/01/2017   INR 1.18 11/22/2014   HGBA1C 8.7 (A) 06/13/2018    Assessment/Plan:  Migraine without aura and without status migrainosus, not intractable  -Discussed headache triggers -Okay to continue also relaxer nightly as needed -Discussed obtaining CT head.  Rx for Ativan given for extreme claustrophobia. - Plan: rizatriptan (MAXALT) 5 MG tablet, CT Head Wo Contrast, ALPRAZolam (XANAX) 0.25 MG tablet -encouraged to keep f/u  appt with Neurology in Feb 2020  F/u prn  Grier Mitts, MD

## 2018-07-25 ENCOUNTER — Other Ambulatory Visit: Payer: Self-pay | Admitting: Internal Medicine

## 2018-07-29 ENCOUNTER — Other Ambulatory Visit: Payer: Self-pay | Admitting: Family Medicine

## 2018-07-29 DIAGNOSIS — I10 Essential (primary) hypertension: Secondary | ICD-10-CM

## 2018-08-05 ENCOUNTER — Ambulatory Visit (INDEPENDENT_AMBULATORY_CARE_PROVIDER_SITE_OTHER)
Admission: RE | Admit: 2018-08-05 | Discharge: 2018-08-05 | Disposition: A | Discharge status: Home / Self Care | Payer: 59 | Source: Ambulatory Visit | Attending: Family Medicine | Admitting: Family Medicine

## 2018-08-05 DIAGNOSIS — G43009 Migraine without aura, not intractable, without status migrainosus: Secondary | ICD-10-CM | POA: Diagnosis not present

## 2018-08-11 ENCOUNTER — Other Ambulatory Visit: Payer: 59

## 2018-09-16 ENCOUNTER — Other Ambulatory Visit: Payer: Self-pay | Admitting: Family Medicine

## 2018-09-16 DIAGNOSIS — R110 Nausea: Secondary | ICD-10-CM

## 2018-09-19 ENCOUNTER — Ambulatory Visit: Payer: Self-pay

## 2018-09-19 ENCOUNTER — Other Ambulatory Visit: Payer: Self-pay | Admitting: Family Medicine

## 2018-09-19 MED ORDER — FLUCONAZOLE 150 MG PO TABS: 150.0000 mg | ORAL_TABLET | Freq: Once | ORAL | 0 refills | Status: AC

## 2018-09-19 NOTE — Telephone Encounter (Signed)
Diflucan sent to pharmacy.  In the future pt will need to be seen.

## 2018-09-19 NOTE — Telephone Encounter (Signed)
Pt c/o itching and cottage cheese discharge from vagina. Pt stated that she has been symptomatic since last Thursday. Pt stated that she has been sick with a cold and her blood sugars have been elevated. Pt stated she is requesting Duflucan to be called in to [facility] [phone]. Pt stated she was treated with Diflucan for theses symptoms before and it cured her. Pt stated that she did not wanto to come in for a OV.   Reason for Disposition . [1] Symptoms of a yeast infection (i.e., itchy, white discharge, not bad smelling) AND    [2] feels like prior vaginal yeast infections  Answer Assessment - Initial Assessment Questions 1. SYMPTOM: "What's the main symptom you're concerned about?" (e.g., pain, itching, dryness)    Irritation, itching,cottage cheese like discharge small amount 2. LOCATION: "Where is the  itching located?" (e.g., inside/outside, left/right)     Inside and outside 3. ONSET: "When did the itching   start?"     Last week (Thursday) 4. PAIN: "Is there any pain?" If so, ask: "How bad is it?" (Scale: 1-10; mild, moderate, severe)     no 5. ITCHING: "Is there any itching?" If so, ask: "How bad is it?" (Scale: 1-10; mild, moderate, severe)     yes 6. CAUSE: "What do you think is causing the discharge?" "Have you had the same problem before? What happened then?"     Yeast infection- yes-doctor ordered diflucan  7. OTHER SYMPTOMS: "Do you have any other symptoms?" (e.g., fever, itching, vaginal bleeding, pain with urination, injury to genital area, vaginal foreign body)     Itching 8. PREGNANCY: "Is there any chance you are pregnant?" "When was your last menstrual period?"     n/a  Protocols used: VAGINAL [facility]

## 2018-09-21 NOTE — Telephone Encounter (Signed)
Spoke with pt state that she took the Rx and was feeling better, pt is aware and verbalized understandin that in future she will need to be seen in the office

## 2018-09-21 NOTE — Progress Notes (Signed)
NEUROLOGY CONSULTATION NOTE  Rachel Dawson MRN: [id number] DOB: 06-03-1981  Referring provider: Grier Mitts, MD Primary care provider: Grier Mitts, MD  Reason for consult:  headaches  HISTORY OF PRESENT ILLNESS: Rachel Dawson is a 38 year old right-handed woman with hypertension and type 2 diabetes who presents for headaches.  History supplemented by ED note.  She denies prior history of headaches.  She had new headache on 07/18/18.  She described a severe throbbing in back of head/neck and temples, associated with nausea, dizziness, photophobia, phonophobia.  No unilateral numbness or weakness or thunderclap headache.  She tried Tylenol and rest, which was not effective.  She presented to the ED that day for management where she was treated with a headache cocktail of Toradol, Reglan and Benadryl.  Blood pressure was normal.  Headache lasted until the next day.  She later had a CT of the head performed on 08/05/18, which was personally reviewed and demonstrated incidental retention cyst in left maxillary antrum but otherwise unremarkable.  She has not had a recurrent headache.  Around that time, she had just quit smoking and drinking coffee.  She also endorsed some neck pain.  She has annual eye exams.  Sleep hygiene:  Varies Other pain:  No Family history of migraines:  Unknown as she was adopted.  PAST MEDICAL HISTORY: Past Medical History:  Diagnosis Date  . History of blood transfusion    "when I had tonsils taken out & w/hyster"  . Hypercholesterolemia   . Hypertension   . Menometrorrhagia   . Microcytic anemia   . Type II diabetes mellitus ([facility])     PAST SURGICAL HISTORY: Past Surgical History:  Procedure Laterality Date  . ABDOMINAL HYSTERECTOMY  ~ 2012  . CESAREAN SECTION  1999; 2004; 2006  . HIP PINNING Bilateral ~ 1991   "balls had dropped out of their sockets"  . TONSILLECTOMY AND ADENOIDECTOMY    . TUBAL LIGATION  2006    MEDICATIONS: Current Outpatient  Medications on File Prior to Visit  Medication Sig Dispense Refill  . ALPRAZolam (XANAX) 0.25 MG tablet Take 1 pill 30 minutes prior to CT scan 1 tablet 0  . blood glucose meter kit and supplies KIT Dispense based on patient and insurance preference. Use up to four times daily as directed. (FOR ICD-9 250.00, 250.01). 1 each 0  . Blood Pressure Monitoring (BLOOD PRESSURE MONITOR/L CUFF) MISC Check your blood pressure at the same time each day. 1 each 0  . chlorthalidone (HYGROTON) 50 MG tablet TAKE 1 TABLET BY MOUTH EVERY DAY 90 tablet 1  . cyclobenzaprine (FLEXERIL) 10 MG tablet Take 1 tablet (10 mg total) by mouth 2 (two) times daily as needed for muscle spasms. 20 tablet 0  . glimepiride (AMARYL) 2 MG tablet TAKE 1 TABLET BY MOUTH DAILY BEFORE SUPPER. (Patient taking differently: Take 2 mg by mouth daily with breakfast. ) 90 tablet 1  . hydrOXYzine (ATARAX/VISTARIL) 50 MG tablet TAKE 1 TABLET (50 MG TOTAL) BY MOUTH 3 (THREE) TIMES DAILY AS NEEDED FOR ANXIETY. 60 tablet 0  . Insulin Glargine (BASAGLAR KWIKPEN) 100 UNIT/ML SOPN INJECT 28 UNITS INTO THE SKIN AT BEDTIME 15 pen 3  . Insulin Pen Needle (BD PEN NEEDLE NANO U/F) 32G X 4 MM MISC Use one needle twice a day 100 each 3  . metFORMIN (GLUCOPHAGE-XR) 500 MG 24 hr tablet TAKE 2 TABLETS BY MOUTH 2 TIMES DAILY WITH A MEAL. 360 tablet 1  . omeprazole (PRILOSEC) 20 MG capsule TAKE 1 CAPSULE  BY MOUTH EVERY DAY 90 capsule 0  . ONETOUCH VERIO test strip TEST UP TO 4 TIMES DAILY 100 each 0  . potassium chloride (MICRO-K) 10 MEQ CR capsule Take 2 capsules (20 mEq total) by mouth daily. 60 capsule 3  . rizatriptan (MAXALT) 5 MG tablet Take 1 tablet (5 mg total) by mouth as needed for migraine. May repeat in 2 hours if needed 10 tablet 0  . [DISCONTINUED] ferrous fumarate (HEMOCYTE - 106 MG FE) 325 (106 FE) MG TABS Take 1 tablet by mouth daily.      . [DISCONTINUED] medroxyPROGESTERone (PROVERA) 10 MG tablet Take 10 mg by mouth daily.       No current  facility-administered medications on file prior to visit.     ALLERGIES: Allergies  Allergen Reactions  . Latex Rash    FAMILY HISTORY: Family History  Adopted: Yes  Problem Relation Age of Onset  . Hypertension Daughter    SOCIAL HISTORY: Social History   Socioeconomic History  . Marital status: Single    Spouse name: Not on file  . Number of children: Not on file  . Years of education: Not on file  . Highest education level: Not on file  Occupational History  . Not on file  Social Needs  . Financial resource strain: Not on file  . Food insecurity:    Worry: Not on file    Inability: Not on file  . Transportation needs:    Medical: Not on file    Non-medical: Not on file  Tobacco Use  . Smoking status: Former Smoker    Packs/day: 0.12    Years: 4.00    Pack years: 0.48    Types: Cigarettes    Last attempt to quit: 07/10/2014    Years since quitting: 4.2  . Smokeless tobacco: Never Used  Substance and Sexual Activity  . Alcohol use: Yes    Comment: occ  . Drug use: No  . Sexual activity: Not Currently    Birth control/protection: Surgical  Lifestyle  . Physical activity:    Days per week: Not on file    Minutes per session: Not on file  . Stress: Not on file  Relationships  . Social connections:    Talks on phone: Not on file    Gets together: Not on file    Attends religious service: Not on file    Active member of club or organization: Not on file    Attends meetings of clubs or organizations: Not on file    Relationship status: Not on file  . Intimate partner violence:    Fear of current or ex partner: Not on file    Emotionally abused: Not on file    Physically abused: Not on file    Forced sexual activity: Not on file  Other Topics Concern  . Not on file  Social History Narrative  . Not on file    REVIEW OF SYSTEMS: Constitutional: No fevers, chills, or sweats, no generalized fatigue, change in appetite Eyes: No visual changes, double  vision, eye pain Ear, nose and throat: No hearing loss, ear pain, nasal congestion, sore throat Cardiovascular: No chest pain, palpitations Respiratory:  No shortness of breath at rest or with exertion, wheezes GastrointestinaI: No nausea, vomiting, diarrhea, abdominal pain, fecal incontinence Genitourinary:  No dysuria, urinary retention or frequency Musculoskeletal:  No neck pain, back pain Integumentary: No rash, pruritus, skin lesions Neurological: as above Psychiatric: No depression, insomnia, anxiety Endocrine: No palpitations, fatigue, diaphoresis,  mood swings, change in appetite, change in weight, increased thirst Hematologic/Lymphatic:  No purpura, petechiae. Allergic/Immunologic: no itchy/runny eyes, nasal congestion, recent allergic reactions, rashes  PHYSICAL EXAM: Blood pressure 112/70, pulse 72, height _0  (1.702 m), weight 265 lb (120.2 kg), last menstrual period 01/05/2011, SpO2 98 %. General: No acute distress.  Patient appears well-groomed.   Head:  Normocephalic/atraumatic Eyes:  fundi examined but not visualized Neck: supple, no paraspinal tenderness, full range of motion Back: No paraspinal tenderness Heart: regular rate and rhythm Lungs: Clear to auscultation bilaterally. Vascular: No carotid bruits. Neurological Exam: Mental status: alert and oriented to person, place, and time, recent and remote memory intact, fund of knowledge intact, attention and concentration intact, speech fluent and not dysarthric, language intact. Cranial nerves: CN I: not tested CN II: pupils equal, round and reactive to light, visual fields intact CN III, IV, VI:  full range of motion, no nystagmus, no ptosis CN V: facial sensation intact CN VII: upper and lower face symmetric CN VIII: hearing intact CN IX, X: gag intact, uvula midline CN XI: sternocleidomastoid and trapezius muscles intact CN XII: tongue midline Bulk & Tone: normal, no fasciculations. Motor:  5/5 throughout    Sensation:  temperature and vibration sensation intact.   Deep Tendon Reflexes:  2+ throughout, toes downgoing.   Finger to nose testing:  Without dysmetria.   Heel to shin:  Without dysmetria.   Gait:  Normal station and stride.  Romberg negative.  IMPRESSION: Migraine without aura, without status migrainosus, intractable.  Likely triggered by nicotine/caffeine withdrawal and neck pain.  PLAN: 1.  If she should have a recurrence, advised to take rizatriptan 65m earliest onset and may repeat after 2 hours if needed (255mmaximum in 24 hours) 2.  Contact me if headaches recur.  Thank you for allowing me to take part in the care of this patient.  AdMetta ClinesDO  CC: ShGrier MittsMD

## 2018-09-23 ENCOUNTER — Ambulatory Visit (INDEPENDENT_AMBULATORY_CARE_PROVIDER_SITE_OTHER): Payer: 59 | Admitting: Internal Medicine

## 2018-09-23 ENCOUNTER — Encounter: Payer: Self-pay | Admitting: Internal Medicine

## 2018-09-23 ENCOUNTER — Encounter: Payer: Self-pay | Admitting: Neurology

## 2018-09-23 ENCOUNTER — Ambulatory Visit (INDEPENDENT_AMBULATORY_CARE_PROVIDER_SITE_OTHER): Payer: 59 | Admitting: Neurology

## 2018-09-23 VITALS — BP 128/80 | HR 66 | Wt 266.0 lb

## 2018-09-23 VITALS — BP 112/70 | HR 72 | Ht 67.0 in | Wt 265.0 lb

## 2018-09-23 DIAGNOSIS — E1165 Type 2 diabetes mellitus with hyperglycemia: Secondary | ICD-10-CM

## 2018-09-23 DIAGNOSIS — G43019 Migraine without aura, intractable, without status migrainosus: Secondary | ICD-10-CM | POA: Diagnosis not present

## 2018-09-23 DIAGNOSIS — IMO0001 Reserved for inherently not codable concepts without codable children: Secondary | ICD-10-CM

## 2018-09-23 DIAGNOSIS — E785 Hyperlipidemia, unspecified: Secondary | ICD-10-CM

## 2018-09-23 DIAGNOSIS — Z6841 Body Mass Index (BMI) 40.0 and over, adult: Secondary | ICD-10-CM

## 2018-09-23 DIAGNOSIS — Z794 Long term (current) use of insulin: Secondary | ICD-10-CM

## 2018-09-23 LAB — LIPID PANEL
CHOL/HDL RATIO: 5
Cholesterol: 208 mg/dL — ABNORMAL HIGH (ref 0–200)
HDL: 39.6 mg/dL (ref >39.00)
LDL CALC: 141 mg/dL — AB (ref 0–99)
NONHDL: 168.17
Triglycerides: 138 mg/dL (ref 0.0–149.0)
VLDL: 27.6 mg/dL (ref 0.0–40.0)

## 2018-09-23 LAB — POCT GLYCOSYLATED HEMOGLOBIN (HGB A1C): HEMOGLOBIN A1C: 9.6 % — AB (ref 4.0–5.6)

## 2018-09-23 MED ORDER — BLOOD GLUCOSE MONITOR KIT: PACK | 0 refills | Status: DC

## 2018-09-23 NOTE — Progress Notes (Signed)
Patient ID: Rachel Dawson, female   DOB: 28-May-1981, 38 y.o.   MRN: [id number]   HPI: Rachel Dawson is a 38 y.o.-year-old female, returning for follow-up for DM2, dx in 2014, insulin-dependent, uncontrolled, without long term complications (but with hyperglycemia, yeast infections).  Last visit 3 months ago.  She quit smoking since last OV!  Last hemoglobin A1c was: 06/13/2018: HbA1c from fructosamine: 7.44% Lab Results  Component Value Date   HGBA1C 8.7 (A) 06/13/2018   HGBA1C 8.4 (H) 02/23/2018   HGBA1C 10.4 07/27/2017   Pt is on a regimen of: - Metformin ER 1000 mg 2x a day with meals > ran out - Basaglar 22 >> 28 units at bedtime She was on Victoza >> pancreatitis. She had nausea and diarrhea from regular metformin.  Pt checks her sugars twice a day: - am: 189-220 >> 125-154, 167 >> 100-106 - 2h after b'fast: n/c - before lunch: n/c - 2h after lunch: n/c - before dinner: 120-140s >> 135-156 >> 130-140s - 2h after dinner: n/c - bedtime: n/c - nighttime: n/c Lowest sugar was 125 >> 100; it is unclear at which level she has hypoglycemia awareness Highest sugar was 220 >> Christmas: 207.  Glucometer:  One Touch Ultra  Pt's meals are: - Breakfast: 2 [place] bacon and 2 boiled eggs >> eggs + sprouted grain bread - Lunch: sandwich + veggies + fruit - Dinner: chicken, pork, [place], fish + veggies + brown/black rice or spaghetti, salad; occasional fast food - Snacks: ginger ale 2x a week, water, tea; juice She increased veggies + fruit. Cut out red meat.  She increased walking.  -No CKD, last BUN/creatinine:  Lab Results  Component Value Date   BUN 18 07/05/2018   BUN 15 06/23/2018   CREATININE 0.83 07/05/2018   CREATININE 0.73 06/23/2018   -+ HL; last set of lipids: Lab Results  Component Value Date   CHOL 217 (H) 06/13/2018   HDL 43.10 06/13/2018   LDLCALC 136 (H) 04/01/2017   LDLDIRECT 156.0 06/13/2018   TRIG 206.0 (H) 06/13/2018   CHOLHDL 5 06/13/2018  At last  visit I advised her to add a statin, but she did not start yet as she wanted to change her diet.  - last eye exam was in 06/2017: ? DR  -No numbness and tingling in her feet.  Pt has FH of DM in mother (?)  -She is adopted.  ROS: Constitutional: no weight gain/no weight loss, no fatigue, no subjective hyperthermia, no subjective hypothermia Eyes: no blurry vision, no xerophthalmia ENT: no sore throat, no nodules palpated in neck, no dysphagia, no odynophagia, no hoarseness Cardiovascular: no CP/no SOB/no palpitations/no leg swelling Respiratory: no cough/no SOB/no wheezing Gastrointestinal: no N/no V/no D/no C/no acid reflux Musculoskeletal: no muscle aches/no joint aches Skin: no rashes, no hair loss Neurological: no tremors/no numbness/no tingling/no dizziness  I reviewed pt's medications, allergies, PMH, social hx, family hx, and changes were documented in the history of present illness. Otherwise, unchanged from my initial visit note.  Past Medical History:  Diagnosis Date  . History of blood transfusion    "when I had tonsils taken out & w/hyster"  . Hypercholesterolemia   . Hypertension   . Menometrorrhagia   . Microcytic anemia   . Type II diabetes mellitus ([place])    Past Surgical History:  Procedure Laterality Date  . ABDOMINAL HYSTERECTOMY  ~ 2012  . CESAREAN SECTION  1999; 2004; 2006  . HIP PINNING Bilateral ~ 1991   "balls had dropped  out of their sockets"  . TONSILLECTOMY AND ADENOIDECTOMY    . TUBAL LIGATION  2006   Social History   Social History  . Marital status: Single    Spouse name: N/A  . Number of children: 2    Occupational History  . Lab tech   Social History Main Topics  . Smoking status: Former Smoker    Packs/day: 0.12 - 2 cigs a day    Years: 4.00    Types: Cigarettes    Quit date: 07/10/2014  . Smokeless tobacco: Never Used  . Alcohol use Yes     Comment: 11/22/2014 "might drink a little a couple times/month, if that"  . Drug use: No   . Sexual activity: Not Currently    Birth control/ protection: Surgical   Current Outpatient Medications on File Prior to Visit  Medication Sig Dispense Refill  . ALPRAZolam (XANAX) 0.25 MG tablet Take 1 pill 30 minutes prior to CT scan 1 tablet 0  . blood glucose meter kit and supplies KIT Dispense based on patient and insurance preference. Use up to four times daily as directed. (FOR ICD-9 250.00, 250.01). 1 each 0  . Blood Pressure Monitoring (BLOOD PRESSURE MONITOR/L CUFF) MISC Check your blood pressure at the same time each day. 1 each 0  . chlorthalidone (HYGROTON) 50 MG tablet TAKE 1 TABLET BY MOUTH EVERY DAY 90 tablet 1  . cyclobenzaprine (FLEXERIL) 10 MG tablet Take 1 tablet (10 mg total) by mouth 2 (two) times daily as needed for muscle spasms. 20 tablet 0  . glimepiride (AMARYL) 2 MG tablet TAKE 1 TABLET BY MOUTH DAILY BEFORE SUPPER. (Patient taking differently: Take 2 mg by mouth daily with breakfast. ) 90 tablet 1  . hydrOXYzine (ATARAX/VISTARIL) 50 MG tablet TAKE 1 TABLET (50 MG TOTAL) BY MOUTH 3 (THREE) TIMES DAILY AS NEEDED FOR ANXIETY. 60 tablet 0  . Insulin Glargine (BASAGLAR KWIKPEN) 100 UNIT/ML SOPN INJECT 28 UNITS INTO THE SKIN AT BEDTIME 15 pen 3  . Insulin Pen Needle (BD PEN NEEDLE NANO U/F) 32G X 4 MM MISC Use one needle twice a day 100 each 3  . metFORMIN (GLUCOPHAGE-XR) 500 MG 24 hr tablet TAKE 2 TABLETS BY MOUTH 2 TIMES DAILY WITH A MEAL. 360 tablet 1  . omeprazole (PRILOSEC) 20 MG capsule TAKE 1 CAPSULE BY MOUTH EVERY DAY 90 capsule 0  . ONETOUCH VERIO test strip TEST UP TO 4 TIMES DAILY 100 each 0  . potassium chloride (MICRO-K) 10 MEQ CR capsule Take 2 capsules (20 mEq total) by mouth daily. 60 capsule 3  . rizatriptan (MAXALT) 5 MG tablet Take 1 tablet (5 mg total) by mouth as needed for migraine. May repeat in 2 hours if needed 10 tablet 0  . [DISCONTINUED] ferrous fumarate (HEMOCYTE - 106 MG FE) 325 (106 FE) MG TABS Take 1 tablet by mouth daily.      .  [DISCONTINUED] medroxyPROGESTERone (PROVERA) 10 MG tablet Take 10 mg by mouth daily.       No current facility-administered medications on file prior to visit.    Allergies  Allergen Reactions  . Latex Rash   Family History  Adopted: Yes  Problem Relation Age of Onset  . Hypertension Daughter     PE: BP 128/80   Pulse 66   Wt 266 lb (120.7 kg)   LMP 01/05/2011 (Exact Date)   SpO2 99%   BMI 41.66 kg/m  Wt Readings from Last 3 Encounters:  09/23/18 266 lb (120.7 kg)  07/18/18 262 lb (118.8 kg)  07/14/18 263 lb (119.3 kg)   Constitutional: overweight, in NAD Eyes: PERRLA, EOMI, no exophthalmos ENT: moist mucous membranes, no thyromegaly, no cervical lymphadenopathy Cardiovascular: RRR, No MRG Respiratory: CTA B Gastrointestinal: abdomen soft, NT, ND, BS+ Musculoskeletal: no deformities, strength intact in all 4 Skin: moist, warm, no rashes, + acanthosis nigricans on neck Neurological: no tremor with outstretched hands, DTR normal in all 4  ASSESSMENT: 1. DM2, insulin-dependent, uncontrolled, without long term complications, but with hyperglycemia  2. Obesity BMI Classification:  < 18.5 underweight   18.5-24.9 normal weight   25.0-29.9 overweight   30.0-34.9 class I obesity   35.0-39.9 class II obesity   ? 40.0 class III obesity   3. HL  PLAN:  1. Patient with history of uncontrolled diabetes, on oral antidiabetic regimen (metformin and sulfonylurea) and basal insulin.  We are limited in the medication choices for her diabetes.  She has a history of frequent urination from chlorthalidone and also pancreatitis from Victoza, so SGLT2 inhibitors and GLP-1 receptor agonists are not safe options for her.  At last visit, sugars were better after she started to change her diet but not quite at goal.  We increased her Basaglar at that time, suggested to cut out juice and to continue diet changes.  HbA1c was higher than, at 8.7%.  However, HbA1c calculated from  fructosamine was 7.44%. -At this visit, sugars are improved compared to last time, after she made quite a few beneficial lifestyle changes.  She quit smoking, improved her diet, and also started to be more active.  I congratulated her for her accomplishments. -Since last visit she came off glimepiride after she ran out.  At this visit, reviewing her sugars, I do not feel we need to restart it, but I did advise her to start taking some sugars at bedtime or 2 hours after dinner to see if she may need it in the future. - I suggested to:  Patient Instructions  Please continue: - Metformin ER 1000 mg 2x a day with meals - Basaglar 28 units at bedtime  Stay off Glimepiride.  Please return in 3-4 months with your sugar log.   - today, HbA1c is 9.6% (higher), but I do not feel this is accurate for her so we will check a fructosamine level. - continue checking sugars at different times of the day - check 1x a day, rotating checks - advised for yearly eye exams >> she is UTD - Return to clinic in 3 mo with sugar log   2. Obesity class 3 -Unfortunately, we cannot use a GLP-1 receptor agonist as she had pancreatitis from Victoza.  She also has frequent urination  from chlorthalidone so we cannot use SGLT 2 inhibitors.  Both of these medication classes may have helped with weight loss. - gained 4 lbs since last OV, probably due to quitting smoking. -She started to walk more and watch her diet.  3. HL - Reviewed latest lipid panel from 06/2018: LDL higher, triglycerides high, as was the total cholesterol Lab Results  Component Value Date   CHOL 217 (H) 06/13/2018   HDL 43.10 06/13/2018   LDLCALC 136 (H) 04/01/2017   LDLDIRECT 156.0 06/13/2018   TRIG 206.0 (H) 06/13/2018   CHOLHDL 5 06/13/2018  -I advised her to start a statin while using good contraception.  However, she did not start this yet as she wanted to work on her diet.  - will repeat the lipid today (although she ate  at [place] this  am)  Component     Latest Ref Rng & Units 09/23/2018  Cholesterol     0 - 200 mg/dL 208 (H)  Triglycerides     0.0 - 149.0 mg/dL 138.0  HDL Cholesterol     >39.00 mg/dL 39.60  VLDL     0.0 - 40.0 mg/dL 27.6  LDL (calc)     0 - 99 mg/dL 141 (H)  Total CHOL/HDL Ratio      5  NonHDL      168.17  Hemoglobin A1C     4.0 - 5.6 % 9.6 (A)  Fructosamine     205 - 285 umol/L 395 (H)   HbA1c calculated from fructosamine is 8.2%, lower than the measured one  but higher than before. LDL is also high, slightly improved.  I would suggest to start pravastatin, as we discussed.  Philemon Kingdom, MD PhD [facility] Endocrin[facility]

## 2018-09-23 NOTE — Patient Instructions (Addendum)
Please continue: - Metformin ER 1000 mg 2x a day with meals - Basaglar 28 units at bedtime  Stay off Glimepiride.  Please return in 3-4 months with your sugar log.

## 2018-09-25 LAB — FRUCTOSAMINE: Fructosamine: 395 umol/L — ABNORMAL HIGH (ref 205–285)

## 2018-10-15 ENCOUNTER — Other Ambulatory Visit: Payer: Self-pay

## 2018-10-15 ENCOUNTER — Encounter (HOSPITAL_COMMUNITY): Payer: Self-pay

## 2018-10-15 ENCOUNTER — Emergency Department (HOSPITAL_COMMUNITY)
Admission: EM | Admit: 2018-10-15 | Discharge: 2018-10-15 | Disposition: A | Discharge status: Home / Self Care | Payer: 59 | Attending: Emergency Medicine | Admitting: Emergency Medicine

## 2018-10-15 DIAGNOSIS — M779 Enthesopathy, unspecified: Secondary | ICD-10-CM | POA: Diagnosis not present

## 2018-10-15 DIAGNOSIS — M79602 Pain in left arm: Secondary | ICD-10-CM | POA: Diagnosis present

## 2018-10-15 DIAGNOSIS — Z87891 Personal history of nicotine dependence: Secondary | ICD-10-CM | POA: Diagnosis not present

## 2018-10-15 DIAGNOSIS — I10 Essential (primary) hypertension: Secondary | ICD-10-CM | POA: Insufficient documentation

## 2018-10-15 DIAGNOSIS — E119 Type 2 diabetes mellitus without complications: Secondary | ICD-10-CM | POA: Insufficient documentation

## 2018-10-15 MED ORDER — NAPROXEN 500 MG PO TABS: 500.0000 mg | ORAL_TABLET | Freq: Two times a day (BID) | ORAL | 0 refills | Status: DC

## 2018-10-15 NOTE — Discharge Instructions (Addendum)
You were evaluated in the Emergency Department and after careful evaluation, we did not find any emergent condition requiring admission or further testing in the hospital.  Your symptoms today seem to be due to inflammation of the tendon of the left arm.  Your heart exam with the EKG was normal and reassuring.  Use the antiinflammatory provided as directed and try to rest the arm for the next few days.  Please return to the Emergency Department if you experience any worsening of your condition.  We encourage you to follow up with a primary care provider.  Thank you for allowing [place] to be a part of your care.

## 2018-10-15 NOTE — ED Triage Notes (Addendum)
Patient reports that she began having a burning pain of the left forearm yesterday. Patient also c/o aching of the posterior neck that radiates into the mid back area. Patient denies any injury or heavy lifting.  Patient states the last couple of times she has had her blood work done she has had a low potassium level.

## 2018-10-15 NOTE — ED Provider Notes (Signed)
[facility] Emergency Department Provider Note MRN:  [id number]  Arrival date & time: 10/15/18     Chief Complaint   Neck Pain; Back Pain; and Arm Pain   History of Present Illness   Rachel Dawson is a 38 y.o. year-old female with a history of hypertension, diabetes presenting to the ED with chief complaint of arm pain.  Pain is located in the left antecubital region, radiates up and down left arm into the left lateral neck.  Present for 2 days, gradual onset, persistent, worse with motion, worse with palpation.  Denies chest pain or shortness of breath.  Denies trauma to the neck or arm.  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  Denies headache or vision change, no abdominal pain, no numbness weakness to the arms or legs.  Review of Systems  A complete 10 system review of systems was obtained and all systems are negative except as noted in the HPI and PMH.   Patient's Health History    Past Medical History:  Diagnosis Date  . History of blood transfusion    "when I had tonsils taken out & w/hyster"  . Hypercholesterolemia   . Hypertension   . Menometrorrhagia   . Microcytic anemia   . Type II diabetes mellitus ([place])     Past Surgical History:  Procedure Laterality Date  . ABDOMINAL HYSTERECTOMY  ~ 2012  . CESAREAN SECTION  1999; 2004; 2006  . HIP PINNING Bilateral ~ 1991   "balls had dropped out of their sockets"  . TONSILLECTOMY AND ADENOIDECTOMY    . TUBAL LIGATION  2006    Family History  Adopted: Yes  Problem Relation Age of Onset  . Hypertension Daughter     Social History   Socioeconomic History  . Marital status: Single    Spouse name: Not on file  . Number of children: 3  . Years of education: Not on file  . Highest education level: Associate degree: academic program  Occupational History  . Occupation: Med Engineer, production: [employer]  Social Needs  . Financial resource strain: Not on file  . Food  insecurity:    Worry: Not on file    Inability: Not on file  . Transportation needs:    Medical: Not on file    Non-medical: Not on file  Tobacco Use  . Smoking status: Former Smoker    Packs/day: 0.12    Years: 4.00    Pack years: 0.48    Types: Cigarettes    Last attempt to quit: 07/10/2014    Years since quitting: 4.2  . Smokeless tobacco: Never Used  Substance and Sexual Activity  . Alcohol use: Yes    Comment: occ  . Drug use: No  . Sexual activity: Not Currently    Birth control/protection: Surgical  Lifestyle  . Physical activity:    Days per week: Not on file    Minutes per session: Not on file  . Stress: Not on file  Relationships  . Social connections:    Talks on phone: Not on file    Gets together: Not on file    Attends religious service: Not on file    Active member of club or organization: Not on file    Attends meetings of clubs or organizations: Not on file    Relationship status: Not on file  . Intimate partner violence:    Fear of current or ex partner: Not  on file    Emotionally abused: Not on file    Physically abused: Not on file    Forced sexual activity: Not on file  Other Topics Concern  . Not on file  Social History Narrative   Patient is right-handed. She is single. She lives in a single level home. She rarely drinks caffeine. She walks daily, and has recently began aeorbic exercises for 30 minutes daily.     Physical Exam  Vital Signs and Nursing Notes reviewed Vitals:   10/15/18 1310 10/15/18 1412  BP: (!) 168/101 137/88  Pulse: 71 66  Resp: 17   Temp: 98.7 F (37.1 C)   SpO2: 100% 97%    CONSTITUTIONAL: Well-appearing, NAD NEURO:  Alert and oriented x 3, normal and symmetric strength and sensation EYES:  eyes equal and reactive ENT/NECK:  no LAD, no JVD CARDIO: Regular rate, well-perfused, normal S1 and S2 PULM:  CTAB no wheezing or rhonchi GI/GU:  normal bowel sounds, non-distended, non-tender MSK/SPINE:  No gross  deformities, no edema; focal tenderness to the left biceps tendon and left trapezius SKIN:  no rash, atraumatic PSYCH:  Appropriate speech and behavior  Diagnostic and Interventional Summary    EKG Interpretation  Date/Time:  Saturday October 15 2018 14:45:23 EST Ventricular Rate:  59 PR Interval:    QRS Duration: 112 QT Interval:  421 QTC Calculation: 417 R Axis:   58 Text Interpretation:  Sinus rhythm Borderline intraventricular conduction delay Confirmed by Gerlene Fee [phone]) on 10/15/2018 2:48:57 PM      Labs Reviewed - No data to display  No orders to display    Medications - No data to display   Procedures Critical Care  ED Course and Medical Decision Making  I have reviewed the triage vital signs and the nursing notes.  Pertinent labs & imaging results that were available during my care of the patient were reviewed by me and considered in my medical decision making (see below for details).  Favoring tendinitis in this 38 year old female with repetitive motion job, will screen with EKG given the left arm pain and patient's desire to make sure her heart is okay.  Spurling sign is negative, no bowel or bladder dysfunction, no neurological deficits, nothing to suggest myelopathy.  EKG without any concerns, appropriate for discharge.  After the discussed management above, the patient was determined to be safe for discharge.  The patient was in agreement with this plan and all questions regarding their care were answered.  ED return precautions were discussed and the patient will return to the ED with any significant worsening of condition.  Barth Kirks. Sedonia Small, [place] [email or web address]  Final Clinical Impressions(s) / ED Diagnoses     ICD-10-CM   1. Tendonitis M77.9     ED Discharge Orders         Ordered    naproxen (NAPROSYN) 500 MG tablet  2 times daily     10/15/18 1450             Maudie Flakes,  MD 10/15/18 1621

## 2018-10-25 ENCOUNTER — Ambulatory Visit: Payer: Self-pay | Admitting: *Deleted

## 2018-10-25 NOTE — Telephone Encounter (Signed)
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 Pt states aware of precautions, requesting Dr. Volanda Napoleon "Provide a work note " to keep her out of work." [phone] May leave detailed message or respond in [place].  Reason for Disposition . [1] Caller requesting NON-URGENT health information AND [2] PCP's office is the best resource  Answer Assessment - Initial Assessment Questions 1. REASON FOR CALL or QUESTION: "What is your reason for calling today?" or "How can I best help you?" or "What question do you have that I can help answer?"     Requesting work note to stay out of work  Protocols used: INFORMATION ONLY CALL-A-AH

## 2018-10-26 NOTE — Telephone Encounter (Signed)
Unfortunately, unable to do that.

## 2018-10-31 ENCOUNTER — Other Ambulatory Visit: Payer: Self-pay | Admitting: Family Medicine

## 2018-11-01 ENCOUNTER — Encounter: Payer: Self-pay | Admitting: Family Medicine

## 2018-11-02 ENCOUNTER — Telehealth: Payer: Self-pay | Admitting: Internal Medicine

## 2018-11-02 NOTE — Telephone Encounter (Signed)
No, I am not, as this would take out of work almost all of my pts. However, I think a stay at home policy will be announced soon for [place].

## 2018-11-02 NOTE — Telephone Encounter (Signed)
Patient stated that she would like to know if Dr Cruzita Lederer is witting notes for patient's work that will excuse them./ She stated she has DM and since the virus is getting bad she is concerned about working  Please advise

## 2018-11-06 ENCOUNTER — Other Ambulatory Visit: Payer: Self-pay | Admitting: Internal Medicine

## 2018-11-12 ENCOUNTER — Other Ambulatory Visit: Payer: Self-pay | Admitting: Internal Medicine

## 2018-12-05 ENCOUNTER — Other Ambulatory Visit: Payer: Self-pay | Admitting: Internal Medicine

## 2018-12-11 ENCOUNTER — Other Ambulatory Visit: Payer: Self-pay | Admitting: Family Medicine

## 2018-12-11 DIAGNOSIS — R110 Nausea: Secondary | ICD-10-CM

## 2018-12-12 ENCOUNTER — Other Ambulatory Visit: Payer: Self-pay | Admitting: Internal Medicine

## 2018-12-12 DIAGNOSIS — E1165 Type 2 diabetes mellitus with hyperglycemia: Secondary | ICD-10-CM

## 2018-12-12 DIAGNOSIS — Z794 Long term (current) use of insulin: Principal | ICD-10-CM

## 2018-12-16 ENCOUNTER — Encounter: Payer: Self-pay | Admitting: Family Medicine

## 2018-12-21 ENCOUNTER — Encounter: Payer: Self-pay | Admitting: Family Medicine

## 2018-12-21 ENCOUNTER — Ambulatory Visit (INDEPENDENT_AMBULATORY_CARE_PROVIDER_SITE_OTHER): Payer: 59 | Admitting: Family Medicine

## 2018-12-21 ENCOUNTER — Other Ambulatory Visit: Payer: Self-pay

## 2018-12-21 DIAGNOSIS — R252 Cramp and spasm: Secondary | ICD-10-CM | POA: Diagnosis not present

## 2018-12-21 DIAGNOSIS — F419 Anxiety disorder, unspecified: Secondary | ICD-10-CM

## 2018-12-21 DIAGNOSIS — Z794 Long term (current) use of insulin: Secondary | ICD-10-CM

## 2018-12-21 DIAGNOSIS — Z8719 Personal history of other diseases of the digestive system: Secondary | ICD-10-CM | POA: Diagnosis not present

## 2018-12-21 DIAGNOSIS — R42 Dizziness and giddiness: Secondary | ICD-10-CM

## 2018-12-21 DIAGNOSIS — E1165 Type 2 diabetes mellitus with hyperglycemia: Secondary | ICD-10-CM

## 2018-12-21 LAB — CBC WITH DIFFERENTIAL/PLATELET
Basophils Absolute: 0 10*3/uL (ref 0.0–0.1)
Basophils Relative: 0.7 % (ref 0.0–3.0)
Eosinophils Absolute: 0.1 10*3/uL (ref 0.0–0.7)
Eosinophils Relative: 2 % (ref 0.0–5.0)
HCT: 36.2 % (ref 36.0–46.0)
Hemoglobin: 12.1 g/dL (ref 12.0–15.0)
Lymphocytes Relative: 33.7 % (ref 12.0–46.0)
Lymphs Abs: 2 10*3/uL (ref 0.7–4.0)
MCHC: 33.4 g/dL (ref 30.0–36.0)
MCV: 81.4 fl (ref 78.0–100.0)
Monocytes Absolute: 0.3 10*3/uL (ref 0.1–1.0)
Monocytes Relative: 6 % (ref 3.0–12.0)
Neutro Abs: 3.4 10*3/uL (ref 1.4–7.7)
Neutrophils Relative %: 57.6 % (ref 43.0–77.0)
Platelets: 354 10*3/uL (ref 150.0–400.0)
RBC: 4.45 Mil/uL (ref 3.87–5.11)
RDW: 13.6 % (ref 11.5–15.5)
WBC: 5.9 10*3/uL (ref 4.0–10.5)

## 2018-12-21 LAB — COMPREHENSIVE METABOLIC PANEL
ALT: 46 U/L — ABNORMAL HIGH (ref 0–35)
AST: 14 U/L (ref 0–37)
Albumin: 4.5 g/dL (ref 3.5–5.2)
Alkaline Phosphatase: 57 U/L (ref 39–117)
BUN: 19 mg/dL (ref 6–23)
CO2: 29 mEq/L (ref 19–32)
Calcium: 9.9 mg/dL (ref 8.4–10.5)
Chloride: 99 mEq/L (ref 96–112)
Creatinine, Ser: 0.97 mg/dL (ref 0.40–1.20)
GFR: 76.98 mL/min (ref >60.00)
Glucose, Bld: 90 mg/dL (ref 70–99)
Potassium: 3.7 mEq/L (ref 3.5–5.1)
Sodium: 138 mEq/L (ref 135–145)
Total Bilirubin: 0.5 mg/dL (ref 0.2–1.2)
Total Protein: 7.5 g/dL (ref 6.0–8.3)

## 2018-12-21 LAB — MAGNESIUM: Magnesium: 1.7 mg/dL (ref 1.5–2.5)

## 2018-12-21 LAB — VITAMIN D 25 HYDROXY (VIT D DEFICIENCY, FRACTURES): VITD: 18.32 ng/mL — ABNORMAL LOW (ref 30.00–100.00)

## 2018-12-21 LAB — TSH: TSH: 1 u[IU]/mL (ref 0.35–4.50)

## 2018-12-21 MED ORDER — POTASSIUM CHLORIDE CRYS ER 20 MEQ PO TBCR: 20.0000 meq | EXTENDED_RELEASE_TABLET | Freq: Every day | ORAL | 5 refills | Status: DC

## 2018-12-21 MED ORDER — HYDROXYZINE HCL 50 MG PO TABS: 50.0000 mg | ORAL_TABLET | Freq: Three times a day (TID) | ORAL | 3 refills | Status: DC | PRN

## 2018-12-21 NOTE — Progress Notes (Signed)
Virtual Visit via Video Note  I connected with Rachel Dawson on 12/21/18 at  9:30 AM EDT by a video enabled telemedicine application and verified that I am speaking with the correct person using two identifiers.  Location patient: home Location provider:work or home office Persons participating in the virtual visit: patient, provider  I discussed the limitations of evaluation and management by telemedicine and the availability of in person appointments. The patient expressed understanding and agreed to proceed.   HPI: Pt notes increased muscle cramps/feeling like extremities are asleep and numb.  Taking chlorthalidone 50 mg and microK.  Pt feels like cramping is worse when taking micro K 20 mEq as opposed to taking micor K 10 mEq.  Pt is not on a statin.  Pt is exercising and eating better.  States fsbs have improved since making changes.  Pt followed by Endocrinology.  Notes feeling dizzy off and on.  fsbs at the time was 80 and 100.  Pt feels like her fsbs are lower after eating.  Pt taking basaglar 28 units qhs and Metformin XR 1000 mg BID.    Also notes increased urination at night.  Pt states she drinks more water in the evenings prior to bed as she gets off work at 7 pm.  Pt notes unable to drink as much during the day while at work.  Pt requesting a refill on hydroxyzine for anxiety.  Notes improvement in symptoms when taking the med.  Pt states she has been rationing pills as the pharmacy would not refill the med.   Pt requesting LFTs be checked.  Has a h/o fatty liver dz.  Denies RUQ pain.  Making dietary changes and exercising.  ROS: See pertinent positives and negatives per HPI.  Past Medical History:  Diagnosis Date  . History of blood transfusion    "when I had tonsils taken out & w/hyster"  . Hypercholesterolemia   . Hypertension   . Menometrorrhagia   . Microcytic anemia   . Type II diabetes mellitus ([place])     Past Surgical History:  Procedure Laterality Date  .  ABDOMINAL HYSTERECTOMY  ~ 2012  . CESAREAN SECTION  1999; 2004; 2006  . HIP PINNING Bilateral ~ 1991   "balls had dropped out of their sockets"  . TONSILLECTOMY AND ADENOIDECTOMY    . TUBAL LIGATION  2006    Family History  Adopted: Yes  Problem Relation Age of Onset  . Hypertension Daughter     SOCIAL HX: Pt now working in a pet lab.   Current Outpatient Medications:  .  ACCU-CHEK AVIVA PLUS test strip, TEST AS DIRECTED, Disp: 100 each, Rfl: 0 .  Accu-Chek Softclix Lancets lancets, USE AS DIRECTED, Disp: 100 each, Rfl: 0 .  ALPRAZolam (XANAX) 0.25 MG tablet, Take 1 pill 30 minutes prior to CT scan (Patient not taking: Reported on 10/15/2018), Disp: 1 tablet, Rfl: 0 .  blood glucose meter kit and supplies KIT, Dispense based on patient and insurance preference.  ICD-9 250.00, 250.01., Disp: 1 each, Rfl: 0 .  Blood Pressure Monitoring (BLOOD PRESSURE MONITOR/L CUFF) MISC, Check your blood pressure at the same time each day., Disp: 1 each, Rfl: 0 .  chlorthalidone (HYGROTON) 50 MG tablet, TAKE 1 TABLET BY MOUTH EVERY DAY (Patient taking differently: Take 50 mg by mouth daily. ), Disp: 90 tablet, Rfl: 1 .  cyclobenzaprine (FLEXERIL) 10 MG tablet, Take 1 tablet (10 mg total) by mouth 2 (two) times daily as needed for muscle spasms., Disp:  20 tablet, Rfl: 0 .  hydrOXYzine (ATARAX/VISTARIL) 50 MG tablet, TAKE 1 TABLET (50 MG TOTAL) BY MOUTH 3 (THREE) TIMES DAILY AS NEEDED FOR ANXIETY., Disp: 60 tablet, Rfl: 0 .  Insulin Glargine (BASAGLAR KWIKPEN) 100 UNIT/ML SOPN, INJECT 28 UNITS INTO THE SKIN AT BEDTIME, Disp: 30 pen, Rfl: 1 .  Insulin Pen Needle (BD PEN NEEDLE NANO U/F) 32G X 4 MM MISC, Use one needle twice a day, Disp: 100 each, Rfl: 3 .  metFORMIN (GLUCOPHAGE-XR) 500 MG 24 hr tablet, TAKE 2 TABLETS BY MOUTH 2 TIMES DAILY WITH A MEAL. (Patient taking differently: Take 1,000 mg by mouth 2 (two) times daily. Give w/food.), Disp: 360 tablet, Rfl: 1 .  naproxen (NAPROSYN) 500 MG tablet, Take 1  tablet (500 mg total) by mouth 2 (two) times daily., Disp: 30 tablet, Rfl: 0 .  omeprazole (PRILOSEC) 20 MG capsule, TAKE 1 CAPSULE BY MOUTH EVERY DAY, Disp: 90 capsule, Rfl: 0 .  potassium chloride (MICRO-K) 10 MEQ CR capsule, TAKE 2 CAPSULES BY MOUTH EVERY DAY, Disp: 180 capsule, Rfl: 1 .  rizatriptan (MAXALT) 5 MG tablet, Take 1 tablet (5 mg total) by mouth as needed for migraine. May repeat in 2 hours if needed, Disp: 10 tablet, Rfl: 0  EXAM:  VITALS per patient if applicable:  RR between 12-20 bpm  GENERAL: alert, oriented, appears well and in no acute distress  HEENT: atraumatic, conjunctiva clear, no obvious abnormalities on inspection of external nose and ears  NECK: normal movements of the head and neck  LUNGS: on inspection no signs of respiratory distress, breathing rate appears normal, no obvious gross SOB, gasping or wheezing  CV: no obvious cyanosis  MS: moves all visible extremities without noticeable abnormality  PSYCH/NEURO: pleasant and cooperative, no obvious depression or anxiety, speech and thought processing grossly intact  ASSESSMENT AND PLAN:  Discussed the following assessment and plan:  Muscle cramps  -likely 2/2 hypokalemia from chlorthalidone 50 mg. -medications reviewed for other possible causes. Consider vit D deficiency, thyroid dysfunction, hypomagnesemia, tendonitis (seen in ED 10/15/18) -Discussed d/c'ing microK and replacing with Kdur 20 mEq as tolerated in the past.   -will check labs.  Replete electrolytes as needed.  - Plan: Magnesium, TSH, Comprehensive metabolic panel, Vitamin D, 25-hydroxy  Anxiety  -improving -Pt encouraged to consider counseling  - Plan: hydrOXYzine (ATARAX/VISTARIL) 50 MG tablet, TSH  Dizziness  -possibly 2/2 hypoglycemia, dehydration -CT head 08/05/18 with retention cyst in L maxillary antrum.  Otherwise unremarkable. - Plan: CBC with Differential/Platelet, TSH  History of fatty infiltration of liver  - Plan:  Comprehensive metabolic panel  Type 2 diabetes mellitus with long-term current use of insulin -hgb A1C 9.6% and fructosamine 395 on 09/23/18 -continue basaglar 28 units qhs and metformin XR 1000 mg BID. -pt congratulated on lifestyle modifications.  Encouraged to continue the good work. -discussed limiting fluid intake and caffeine prior to bed to decrease nocturia. -continue to monitor. -continue f/u with Endocrinology, Dr. Cruzita Lederer  F/u prn in 2-4 wks for continued symptoms.   I discussed the assessment and treatment plan with the patient. The patient was provided an opportunity to ask questions and all were answered. The patient agreed with the plan and demonstrated an understanding of the instructions.   The patient was advised to call back or seek an in-person evaluation if the symptoms worsen or if the condition fails to improve as anticipated.   Billie Ruddy, MD

## 2018-12-22 ENCOUNTER — Other Ambulatory Visit: Payer: Self-pay | Admitting: Family Medicine

## 2018-12-22 DIAGNOSIS — E559 Vitamin D deficiency, unspecified: Secondary | ICD-10-CM

## 2018-12-22 MED ORDER — VITAMIN D (ERGOCALCIFEROL) 1.25 MG (50000 UNIT) PO CAPS: 50000.0000 [IU] | ORAL_CAPSULE | ORAL | 0 refills | Status: DC

## 2019-01-10 ENCOUNTER — Other Ambulatory Visit: Payer: Self-pay

## 2019-01-10 ENCOUNTER — Encounter: Payer: Self-pay | Admitting: Internal Medicine

## 2019-01-10 ENCOUNTER — Ambulatory Visit (INDEPENDENT_AMBULATORY_CARE_PROVIDER_SITE_OTHER): Payer: 59 | Admitting: Internal Medicine

## 2019-01-10 DIAGNOSIS — E1165 Type 2 diabetes mellitus with hyperglycemia: Secondary | ICD-10-CM

## 2019-01-10 DIAGNOSIS — E785 Hyperlipidemia, unspecified: Secondary | ICD-10-CM | POA: Diagnosis not present

## 2019-01-10 DIAGNOSIS — Z794 Long term (current) use of insulin: Secondary | ICD-10-CM | POA: Diagnosis not present

## 2019-01-10 DIAGNOSIS — Z6841 Body Mass Index (BMI) 40.0 and over, adult: Secondary | ICD-10-CM

## 2019-01-10 MED ORDER — BASAGLAR KWIKPEN 100 UNIT/ML ~~LOC~~ SOPN: PEN_INJECTOR | SUBCUTANEOUS | 11 refills | Status: DC

## 2019-01-10 NOTE — Progress Notes (Signed)
Patient ID: Rachel Dawson, female   DOB: 03/06/1981, 38 y.o.   MRN: [id number]   Patient location: Home My location: Office  Referring Provider: Billie Ruddy, MD  I connected with the patient on 01/10/19 at  9:25 AM EDT by a video enabled telemedicine application and verified that I am speaking with the correct person.   I discussed the limitations of evaluation and management by telemedicine and the availability of in person appointments. The patient expressed understanding and agreed to proceed.   Details of the encounter are shown below.  HPI: Rachel Dawson is a 38 y.o.-year-old female, presenting for follow-up for DM2, dx in 2014, insulin-dependent, uncontrolled, without long term complications (but with hyperglycemia, yeast infections).  Last visit  3 months ago.  Last hemoglobin A1c was: 09/23/2018: HbA1c calculated from fructosamine is 8.2%, lower than the measured one  but higher than before. 06/13/2018: HbA1c from fructosamine: 7.44% Lab Results  Component Value Date   HGBA1C 9.6 (A) 09/23/2018   HGBA1C 8.7 (A) 06/13/2018   HGBA1C 8.4 (H) 02/23/2018   Pt is on a regimen of: - Metformin ER 1000 mg 2x a day with meals - Basaglar 22 >> 28 units at bedtime She was on Victoza >> pancreatitis. She had nausea and diarrhea from regular metformin. She was on glimepiride 4 mg before dinner but ran out 08/2018 her last visit and we did not restart.   Pt checks her sugars 2x a day, - am: 189-220 >> 125-154, 167 >> 100-106 >> 79-114 - 2h after b'fast: n/c >> 90-143, 164 - before lunch: n/c - 2h after lunch: n/c >> 100-139 - before dinner: 135-156 >> 130-140s >> 96-129, 133 - 2h after dinner: n/c >> 114-157 - bedtime: n/c - nighttime: n/c Lowest sugar was 125 >> 100 >> 79; it is unclear at which level she has hypoglycemia awareness. Highest sugar was 220 >> Christmas: 207 >> 164.  Glucometer:  One Touch Ultra  Pt's meals are: - Breakfast: 2 [place] bacon and 2 boiled eggs >>  eggs + sprouted grain bread - Lunch: sandwich + veggies + fruit - Dinner: chicken, pork, [place], fish + veggies + brown/black rice or spaghetti, salad; occasional fast food - Snacks: ginger ale 2x a week, water, tea; juice She increased veggies + fruit. Cut out red meat.  She continues walking.  -No CKD, last BUN/creatinine:  Lab Results  Component Value Date   BUN 19 12/21/2018   BUN 18 07/05/2018   CREATININE 0.97 12/21/2018   CREATININE 0.83 07/05/2018   -+ HL; last set of lipids: Lab Results  Component Value Date   CHOL 208 (H) 09/23/2018   HDL 39.60 09/23/2018   LDLCALC 141 (H) 09/23/2018   LDLDIRECT 156.0 06/13/2018   TRIG 138.0 09/23/2018   CHOLHDL 5 09/23/2018  She did not stop pravastatin suggested at last visit.  - last eye exam was in 06/2017:?  DR  - no numbness and tingling in her feet.  Pt has FH of DM in mother (?)  -She is adopted.  ROS: Constitutional: no weight gain/+ weight loss, no fatigue, no subjective hyperthermia, no subjective hypothermia Eyes: no blurry vision, no xerophthalmia ENT: no sore throat, no nodules palpated in neck, no dysphagia, no odynophagia, no hoarseness Cardiovascular: no CP/no SOB/no palpitations/no leg swelling Respiratory: no cough/no SOB/no wheezing Gastrointestinal: no N/no V/no D/no C/no acid reflux Musculoskeletal: no muscle aches/no joint aches Skin: no rashes, no hair loss Neurological: no tremors/no numbness/no tingling/no dizziness  I reviewed pt's  medications, allergies, PMH, social hx, family hx, and changes were documented in the history of present illness. Otherwise, unchanged from my initial visit note.  Past Medical History:  Diagnosis Date  . History of blood transfusion    "when I had tonsils taken out & w/hyster"  . Hypercholesterolemia   . Hypertension   . Menometrorrhagia   . Microcytic anemia   . Type II diabetes mellitus ([place])    Past Surgical History:  Procedure Laterality Date  . ABDOMINAL  HYSTERECTOMY  ~ 2012  . CESAREAN SECTION  1999; 2004; 2006  . HIP PINNING Bilateral ~ 1991   "balls had dropped out of their sockets"  . TONSILLECTOMY AND ADENOIDECTOMY    . TUBAL LIGATION  2006   Social History   Social History  . Marital status: Single    Spouse name: N/A  . Number of children: 2    Occupational History  . Lab tech   Social History Main Topics  . Smoking status: Former Smoker    Packs/day: 0.12 - 2 cigs a day    Years: 4.00    Types: Cigarettes    Quit date: 07/10/2014  . Smokeless tobacco: Never Used  . Alcohol use Yes     Comment: 11/22/2014 "might drink a little a couple times/month, if that"  . Drug use: No  . Sexual activity: Not Currently    Birth control/ protection: Surgical   Current Outpatient Medications on File Prior to Visit  Medication Sig Dispense Refill  . ACCU-CHEK AVIVA PLUS test strip TEST AS DIRECTED 100 each 0  . Accu-Chek Softclix Lancets lancets USE AS DIRECTED 100 each 0  . blood glucose meter kit and supplies KIT Dispense based on patient and insurance preference.  ICD-9 250.00, 250.01. 1 each 0  . Blood Pressure Monitoring (BLOOD PRESSURE MONITOR/L CUFF) MISC Check your blood pressure at the same time each day. 1 each 0  . chlorthalidone (HYGROTON) 50 MG tablet TAKE 1 TABLET BY MOUTH EVERY DAY (Patient taking differently: Take 50 mg by mouth daily. ) 90 tablet 1  . cyclobenzaprine (FLEXERIL) 10 MG tablet Take 1 tablet (10 mg total) by mouth 2 (two) times daily as needed for muscle spasms. 20 tablet 0  . hydrOXYzine (ATARAX/VISTARIL) 50 MG tablet Take 1 tablet (50 mg total) by mouth 3 (three) times daily as needed for anxiety. 60 tablet 3  . Insulin Glargine (BASAGLAR KWIKPEN) 100 UNIT/ML SOPN INJECT 28 UNITS INTO THE SKIN AT BEDTIME 30 pen 1  . Insulin Pen Needle (BD PEN NEEDLE NANO U/F) 32G X 4 MM MISC Use one needle twice a day 100 each 3  . metFORMIN (GLUCOPHAGE-XR) 500 MG 24 hr tablet TAKE 2 TABLETS BY MOUTH 2 TIMES DAILY WITH A  MEAL. (Patient taking differently: Take 1,000 mg by mouth 2 (two) times daily. Give w/food.) 360 tablet 1  . naproxen (NAPROSYN) 500 MG tablet Take 1 tablet (500 mg total) by mouth 2 (two) times daily. 30 tablet 0  . omeprazole (PRILOSEC) 20 MG capsule TAKE 1 CAPSULE BY MOUTH EVERY DAY 90 capsule 0  . potassium chloride SA (K-DUR) 20 MEQ tablet Take 1 tablet (20 mEq total) by mouth daily. 60 tablet 5  . rizatriptan (MAXALT) 5 MG tablet Take 1 tablet (5 mg total) by mouth as needed for migraine. May repeat in 2 hours if needed 10 tablet 0  . Vitamin D, Ergocalciferol, (DRISDOL) 1.25 MG (50000 UT) CAPS capsule Take 1 capsule (50,000 Units total) by mouth  every 7 (seven) days. 12 capsule 0  . [DISCONTINUED] ferrous fumarate (HEMOCYTE - 106 MG FE) 325 (106 FE) MG TABS Take 1 tablet by mouth daily.      . [DISCONTINUED] medroxyPROGESTERone (PROVERA) 10 MG tablet Take 10 mg by mouth daily.       No current facility-administered medications on file prior to visit.    Allergies  Allergen Reactions  . Latex Rash   Family History  Adopted: Yes  Problem Relation Age of Onset  . Hypertension Daughter     PE: LMP 01/05/2011 (Exact Date)  Wt Readings from Last 3 Encounters:  10/15/18 265 lb (120.2 kg)  09/23/18 265 lb (120.2 kg)  09/23/18 266 lb (120.7 kg)   Constitutional:  in NAD  The physical exam was not performed (virtual visit).  ASSESSMENT: 1. DM2, insulin-dependent, uncontrolled, without long term complications, but with hyperglycemia  2. Obesity BMI Classification:  < 18.5 underweight   18.5-24.9 normal weight   25.0-29.9 overweight   30.0-34.9 class I obesity   35.0-39.9 class II obesity   ? 40.0 class III obesity   3. HL  PLAN:  1. Patient with history of uncontrolled diabetes, on oral antidiabetic regimen with metformin and also basal insulin.  We are limited in the medication choices for her diabetes due to her history of frequent urination from chlorthalidone and  also pancreatitis from Victoza, so the SGLT2 inhibitors and GLP-1 receptor agonist are not safe options for her.  We discussed in the past about healthy changes to her diet and her sugars were improved but they were not quite at goal.  We also increased her Basaglar and I also suggested to start walking more.  At last visit, HbA1c calculated from fructosamine was higher, at 8.2% compared to 7.4% at the previous visit.  At that time, she came off glimepiride after she ran out. - her sugars are much better as she is taking her meds consistently and she is very careful with her diet and walking at work - will not change her regimen now and congratulated her - I suggested to:  Patient Instructions  Please continue: - Metformin ER 1000 mg 2x a day with meals - Basaglar 28 units at bedtime  Please return in 4 months with your sugar log.   -We will recheck her HbA1c when she returns to the clinic - continue checking sugars at different times of the day - check 1-2x a day, rotating checks - advised for yearly eye exams >> she is not UTD - Return to clinic in 4 mo with sugar log    2. Obesity class 3 -Unfortunately, we cannot use a GLP-1 receptor agonist that she has pancreatitis from Victoza.  She also has frequent urination from chlorthalidone so we cannot use SGLT2 inhibitors.  Both of these medication classes without help with weight loss -At last visit I advised her to try to walk more and watch her diet - she feels she lost weight since last OV but has not weighed herself  3. HL - Reviewed latest lipid panel from 09/2018: LDL improved, but still high Lab Results  Component Value Date   CHOL 208 (H) 09/23/2018   HDL 39.60 09/23/2018   LDLCALC 141 (H) 09/23/2018   LDLDIRECT 156.0 06/13/2018   TRIG 138.0 09/23/2018   CHOLHDL 5 09/23/2018  -At last visit I suggested pravastatin, but she did not start this  Philemon Kingdom, MD PhD [facility] Endocrinology

## 2019-01-10 NOTE — Patient Instructions (Addendum)
Please continue: - Metformin ER 1000 mg 2x a day with meals - Basaglar 28 units at bedtime  Please return in 4 months with your sugar log.

## 2019-01-17 ENCOUNTER — Other Ambulatory Visit: Payer: Self-pay | Admitting: Internal Medicine

## 2019-01-17 ENCOUNTER — Other Ambulatory Visit: Payer: Self-pay | Admitting: Family Medicine

## 2019-01-17 DIAGNOSIS — I10 Essential (primary) hypertension: Secondary | ICD-10-CM

## 2019-01-21 ENCOUNTER — Other Ambulatory Visit: Payer: Self-pay | Admitting: Internal Medicine

## 2019-03-06 ENCOUNTER — Other Ambulatory Visit: Payer: Self-pay | Admitting: Family Medicine

## 2019-03-06 DIAGNOSIS — R110 Nausea: Secondary | ICD-10-CM

## 2019-03-09 ENCOUNTER — Other Ambulatory Visit: Payer: Self-pay | Admitting: Family Medicine

## 2019-03-09 DIAGNOSIS — E559 Vitamin D deficiency, unspecified: Secondary | ICD-10-CM

## 2019-04-18 ENCOUNTER — Encounter: Payer: Self-pay | Admitting: Family Medicine

## 2019-04-28 ENCOUNTER — Telehealth: Payer: Self-pay

## 2019-04-28 NOTE — Telephone Encounter (Signed)
Spoke with pt states that she has noticed some dark bruises on her legs, pt states that she wanted to schedule an appointment with Dr Darien Ramus pt to go to the nearest UC/ED for evaluation since Dr Volanda Napoleon is on vacation and there is no provider with availability today at the clinic. Pt verbalized understanding and agreed to go to the ED

## 2019-07-27 ENCOUNTER — Other Ambulatory Visit: Payer: Self-pay | Admitting: Family Medicine

## 2019-07-27 DIAGNOSIS — I10 Essential (primary) hypertension: Secondary | ICD-10-CM

## 2019-08-15 ENCOUNTER — Encounter: Payer: Self-pay | Admitting: Internal Medicine

## 2019-08-16 ENCOUNTER — Other Ambulatory Visit: Payer: Self-pay | Admitting: Internal Medicine

## 2019-08-16 MED ORDER — TRESIBA FLEXTOUCH 200 UNIT/ML ~~LOC~~ SOPN: 28.0000 [IU] | PEN_INJECTOR | Freq: Every day | SUBCUTANEOUS | 3 refills | Status: DC

## 2019-09-11 MED FILL — FREESTYLE LITE TEST STRIP: 50 days supply | Qty: 100 | Fill #0

## 2019-09-11 MED FILL — FREESTYLE LANCETS: 50 days supply | Qty: 100 | Fill #0

## 2019-09-11 MED FILL — CHLORTHALIDONE 50 MG TABS: 50 | 30 days supply | Qty: 30 | Fill #0

## 2019-09-11 MED FILL — FREESTYLE LITE METER: 20 days supply | Qty: 1 | Fill #0

## 2019-09-11 MED FILL — TRESIBA FLEXTOUCH 200 UNITS: 200 | 85 days supply | Qty: 12 | Fill #0

## 2019-10-11 MED FILL — CHLORTHALIDONE 50 MG TABS: 50 | 30 days supply | Qty: 30 | Fill #1

## 2019-10-25 ENCOUNTER — Encounter: Payer: Self-pay | Admitting: Family Medicine

## 2019-10-25 DIAGNOSIS — H52223 Regular astigmatism, bilateral: Secondary | ICD-10-CM | POA: Diagnosis not present

## 2019-10-25 DIAGNOSIS — H5213 Myopia, bilateral: Secondary | ICD-10-CM | POA: Diagnosis not present

## 2019-10-25 DIAGNOSIS — E119 Type 2 diabetes mellitus without complications: Secondary | ICD-10-CM | POA: Diagnosis not present

## 2019-10-25 DIAGNOSIS — H30001 Unspecified focal chorioretinal inflammation, right eye: Secondary | ICD-10-CM | POA: Diagnosis not present

## 2019-10-25 LAB — HM DIABETES EYE EXAM

## 2019-11-02 ENCOUNTER — Encounter (INDEPENDENT_AMBULATORY_CARE_PROVIDER_SITE_OTHER): Payer: Self-pay | Admitting: Family Medicine

## 2019-11-02 ENCOUNTER — Ambulatory Visit (INDEPENDENT_AMBULATORY_CARE_PROVIDER_SITE_OTHER): Payer: 59 | Admitting: Family Medicine

## 2019-11-02 ENCOUNTER — Other Ambulatory Visit: Payer: Self-pay

## 2019-11-02 VITALS — BP 135/85 | HR 64 | Temp 98.4°F | Ht 66.0 in | Wt 275.0 lb

## 2019-11-02 DIAGNOSIS — R0602 Shortness of breath: Secondary | ICD-10-CM | POA: Diagnosis not present

## 2019-11-02 DIAGNOSIS — E1159 Type 2 diabetes mellitus with other circulatory complications: Secondary | ICD-10-CM | POA: Diagnosis not present

## 2019-11-02 DIAGNOSIS — E559 Vitamin D deficiency, unspecified: Secondary | ICD-10-CM

## 2019-11-02 DIAGNOSIS — R5383 Other fatigue: Secondary | ICD-10-CM | POA: Diagnosis not present

## 2019-11-02 DIAGNOSIS — Z6841 Body Mass Index (BMI) 40.0 and over, adult: Secondary | ICD-10-CM

## 2019-11-02 DIAGNOSIS — I10 Essential (primary) hypertension: Secondary | ICD-10-CM

## 2019-11-02 DIAGNOSIS — E119 Type 2 diabetes mellitus without complications: Secondary | ICD-10-CM

## 2019-11-02 DIAGNOSIS — Z1331 Encounter for screening for depression: Secondary | ICD-10-CM | POA: Diagnosis not present

## 2019-11-02 DIAGNOSIS — Z0289 Encounter for other administrative examinations: Secondary | ICD-10-CM

## 2019-11-02 DIAGNOSIS — E1169 Type 2 diabetes mellitus with other specified complication: Secondary | ICD-10-CM

## 2019-11-02 DIAGNOSIS — E785 Hyperlipidemia, unspecified: Secondary | ICD-10-CM

## 2019-11-02 DIAGNOSIS — Z794 Long term (current) use of insulin: Secondary | ICD-10-CM

## 2019-11-02 DIAGNOSIS — R0683 Snoring: Secondary | ICD-10-CM

## 2019-11-02 DIAGNOSIS — I152 Hypertension secondary to endocrine disorders: Secondary | ICD-10-CM

## 2019-11-03 LAB — COMPREHENSIVE METABOLIC PANEL
ALT: 17 IU/L (ref 0–32)
AST: 15 IU/L (ref 0–40)
Albumin/Globulin Ratio: 1.6 (ref 1.2–2.2)
Albumin: 4.5 g/dL (ref 3.8–4.8)
Alkaline Phosphatase: 80 IU/L (ref 39–117)
BUN/Creatinine Ratio: 24 — ABNORMAL HIGH (ref 9–23)
BUN: 21 mg/dL (ref 6–24)
Bilirubin Total: 0.2 mg/dL (ref 0.0–1.2)
CO2: 24 mmol/L (ref 20–29)
Calcium: 9.8 mg/dL (ref 8.7–10.2)
Chloride: 100 mmol/L (ref 96–106)
Creatinine, Ser: 0.86 mg/dL (ref 0.57–1.00)
GFR calc Af Amer: 98 mL/min/{1.73_m2} (ref >59)
GFR calc non Af Amer: 85 mL/min/{1.73_m2} (ref >59)
Globulin, Total: 2.8 g/dL (ref 1.5–4.5)
Glucose: 104 mg/dL — ABNORMAL HIGH (ref 65–99)
Potassium: 3.7 mmol/L (ref 3.5–5.2)
Sodium: 138 mmol/L (ref 134–144)
Total Protein: 7.3 g/dL (ref 6.0–8.5)

## 2019-11-03 LAB — ANEMIA PANEL
Ferritin: 111 ng/mL (ref 15–150)
Folate, Hemolysate: 335 ng/mL
Folate, RBC: 852 ng/mL (ref >498)
Hematocrit: 39.3 % (ref 34.0–46.6)
Iron Saturation: 20 % (ref 15–55)
Iron: 72 ug/dL (ref 27–159)
Retic Ct Pct: 1.8 % (ref 0.6–2.6)
Total Iron Binding Capacity: 363 ug/dL (ref 250–450)
UIBC: 291 ug/dL (ref 131–425)
Vitamin B-12: 402 pg/mL (ref 232–1245)

## 2019-11-03 LAB — TSH: TSH: 0.971 u[IU]/mL (ref 0.450–4.500)

## 2019-11-03 LAB — CBC WITH DIFFERENTIAL/PLATELET
Basophils Absolute: 0 10*3/uL (ref 0.0–0.2)
Basos: 1 %
EOS (ABSOLUTE): 0.2 10*3/uL (ref 0.0–0.4)
Eos: 2 %
Hemoglobin: 12.6 g/dL (ref 11.1–15.9)
Immature Grans (Abs): 0 10*3/uL (ref 0.0–0.1)
Immature Granulocytes: 0 %
Lymphocytes Absolute: 1.8 10*3/uL (ref 0.7–3.1)
Lymphs: 26 %
MCH: 27.1 pg (ref 26.6–33.0)
MCHC: 32.1 g/dL (ref 31.5–35.7)
MCV: 85 fL (ref 79–97)
Monocytes Absolute: 0.5 10*3/uL (ref 0.1–0.9)
Monocytes: 7 %
Neutrophils Absolute: 4.6 10*3/uL (ref 1.4–7.0)
Neutrophils: 64 %
Platelets: 309 10*3/uL (ref 150–450)
RBC: 4.65 x10E6/uL (ref 3.77–5.28)
RDW: 13.5 % (ref 11.7–15.4)
WBC: 7.1 10*3/uL (ref 3.4–10.8)

## 2019-11-03 LAB — HEMOGLOBIN A1C
Est. average glucose Bld gHb Est-mCnc: 163 mg/dL
Hgb A1c MFr Bld: 7.3 % — ABNORMAL HIGH (ref 4.8–5.6)

## 2019-11-03 LAB — T4, FREE: Free T4: 1.13 ng/dL (ref 0.82–1.77)

## 2019-11-03 LAB — T3: T3, Total: 93 ng/dL (ref 71–180)

## 2019-11-03 LAB — LIPID PANEL WITH LDL/HDL RATIO
Cholesterol, Total: 234 mg/dL — ABNORMAL HIGH (ref 100–199)
HDL: 63 mg/dL (ref >39)
LDL Chol Calc (NIH): 154 mg/dL — ABNORMAL HIGH (ref 0–99)
LDL/HDL Ratio: 2.4 ratio (ref 0.0–3.2)
Triglycerides: 95 mg/dL (ref 0–149)
VLDL Cholesterol Cal: 17 mg/dL (ref 5–40)

## 2019-11-03 LAB — VITAMIN D 25 HYDROXY (VIT D DEFICIENCY, FRACTURES): Vit D, 25-Hydroxy: 20.6 ng/mL — ABNORMAL LOW (ref 30.0–100.0)

## 2019-11-03 LAB — FRUCTOSAMINE: Fructosamine: 300 umol/L — ABNORMAL HIGH (ref 0–285)

## 2019-11-06 NOTE — Progress Notes (Signed)
Chief Complaint:   OBESITY Rachel Dawson (MR# [id number]) is a 39 y.o. female who presents for evaluation and treatment of obesity and related comorbidities. Current BMI is Body mass index is 44.39 kg/m. Kieshia has been struggling with her weight for many years and has been unsuccessful in either losing weight, maintaining weight loss, or reaching her healthy weight goal.  Phillip is currently in the action stage of change and ready to dedicate time achieving and maintaining a healthier weight. Kimani is interested in becoming our patient and working on intensive lifestyle modifications including (but not limited to) diet and exercise for weight loss.  Bethsaida's habits were reviewed today and are as follows: Her family eats meals together, she thinks her family will eat healthier with her, her desired weight loss is 101 pounds, she has been heavy most of her life, she started gaining weight in high school, her heaviest weight ever was 320 pounds, she craves nuts and seafood, she is frequently drinking liquids with calories, she sometimes has excessive hunger and she struggles with emotional eating.  Depression Screen Velisa's Food and Mood (modified PHQ-9) score was 4.  Depression screen [facility] 2/9 11/02/2019  Decreased Interest 0  Down, Depressed, Hopeless 1  PHQ - 2 Score 1  Altered sleeping 0  Tired, decreased energy 1  Change in appetite 1  Feeling bad or failure about yourself  1  Trouble concentrating 0  Moving slowly or fidgety/restless 0  Suicidal thoughts 0  PHQ-9 Score 4  Difficult doing work/chores Not difficult at all   Subjective:   1. Other fatigue Charonda denies daytime somnolence and reports waking up still tired. Patent has a history of symptoms of snoring. Kaleesha generally gets 6 hours of sleep per night, and states that she has generally restful sleep. Snoring is present. Apneic episodes are not present. Epworth Sleepiness Score is 5.  2. Shortness of  breath on exertion Usha notes increasing shortness of breath with exercising and seems to be worsening over time with weight gain. She notes getting out of breath sooner with activity than she used to. This has not gotten worse recently. Timia denies shortness of breath at rest or orthopnea.  3. Diabetes mellitus, type II, insulin dependent ([place]) with hyperglycemia Amillya is taking [place] and metformin.  Victoza caused pancreatitis in 2006.  She is followed by Dr. Cruzita Lederer.  Fasting blood sugars 88, 98, 103.  Lowest 72.  Lab Results  Component Value Date   HGBA1C 7.3 (H) 11/02/2019   HGBA1C 9.6 (A) 09/23/2018   HGBA1C 8.7 (A) 06/13/2018   Lab Results  Component Value Date   LDLCALC 154 (H) 11/02/2019   CREATININE 0.86 11/02/2019   4. Hypertension associated with diabetes ([place]) Review: taking medications as instructed, no medication side effects noted, no chest pain on exertion, no dyspnea on exertion, no swelling of ankles.  Birute is taking chlorthalidone for blood pressure.  BP Readings from Last 3 Encounters:  11/02/19 135/85  10/15/18 137/88  09/23/18 112/70   5. Vitamin D deficiency Notnamed's Vitamin D level was 18.32 on 12/21/2018. She is currently taking no vitamin D supplement. She denies nausea, vomiting or muscle weakness.  6. Hyperlipidemia associated with type 2 diabetes mellitus ([place]) Metha has hyperlipidemia and has been trying to improve her cholesterol levels with intensive lifestyle modification including a low saturated fat diet, exercise and weight loss. She denies any chest pain, claudication or myalgias.  Lab Results  Component Value Date   ALT  17 11/02/2019   AST 15 11/02/2019   ALKPHOS 80 11/02/2019   BILITOT 0.2 11/02/2019   Lab Results  Component Value Date   CHOL 234 (H) 11/02/2019   HDL 63 11/02/2019   LDLCALC 154 (H) 11/02/2019   LDLDIRECT 156.0 06/13/2018   TRIG 95 11/02/2019   CHOLHDL 5 09/23/2018   7. Snores Jennefer has a history  of tonsillectomy/adenoidectomy.  Situation Chance of Dozing or Sleeping  Sitting and reading 2 = moderate chance of dozing or sleeping  Watching television 2 = moderate chance of dozing or sleeping  Sitting as a passenger in a car for an hour 1 = slight chance of dozing or sleeping  Sitting inactive in a public place (theater or meeting) 0 = would never doze or sleep  Lying down in the afternoon when circumstances permit 0 = would never doze or sleep  Sitting and talking to someone 0 = would never doze or sleep  Sitting quietly after lunch without alcohol 0 = would never doze or sleep  In a car, while stopped for a few minutes in traffic 0 = would never doze or sleep  TOTAL 5   8. Depression screening Chaylee was screened for depression as part of her new patient workup.  Assessment/Plan:   1. Other fatigue Jakila does not feel that her weight is causing her energy to be lower than it should be. Fatigue may be related to obesity, depression or many other causes. Labs will be ordered, and in the meanwhile, Leonna will focus on self care including making healthy food choices, increasing physical activity and focusing on stress reduction.  Orders - EKG 12-Lead - T3 - T4, free - TSH - Anemia panel - CBC with Differential/Platelet - Comprehensive metabolic panel  2. Shortness of breath on exertion Yarima does not feel that she gets out of breath more easily that she used to when she exercises. Tearsa's shortness of breath appears to be obesity related and exercise induced. She has agreed to work on weight loss and gradually increase exercise to treat her exercise induced shortness of breath. Will continue to monitor closely.  3. Diabetes mellitus, type II, insulin dependent (HCC) with hyperglycemia Good blood sugar control is important to decrease the likelihood of diabetic complications such as nephropathy, neuropathy, limb loss, blindness, coronary artery disease, and death.  Intensive lifestyle modification including diet, exercise and weight loss are the first line of treatment for diabetes.   Orders - Microalbumin / creatinine urine ratio - Hemoglobin A1c - Fructosamine  4. Hypertension associated with diabetes ([place]) Eduarda is working on healthy weight loss and exercise to improve blood pressure control. We will watch for signs of hypotension as she continues her lifestyle modifications.  5. Vitamin D deficiency Low Vitamin D level contributes to fatigue and are associated with obesity, breast, and colon cancer.   Orders - VITAMIN D 25 Hydroxy (Vit-D Deficiency, Fractures)  6. Hyperlipidemia associated with type 2 diabetes mellitus ([place]) Cardiovascular risk and specific lipid/LDL goals reviewed.  We discussed several lifestyle modifications today and Gyllian will continue to work on diet, exercise and weight loss efforts. Orders and follow up as documented in patient record.   Counseling Intensive lifestyle modifications are the first line treatment for this issue. . Dietary changes: Increase soluble fiber. Decrease simple carbohydrates. . Exercise changes: Moderate to vigorous-intensity aerobic activity 150 minutes per week if tolerated. . Lipid-lowering medications: see documented in medical record.  Orders - Lipid Panel With LDL/HDL Ratio  7.  Snores  Intensive lifestyle modifications are the first line treatment for this issue. We discussed several lifestyle modifications today and she will continue to work on diet, exercise and weight loss efforts. We will continue to monitor. Orders and follow up as documented in patient record.   Counseling  Sleep apnea is a condition in which breathing pauses or becomes shallow during sleep. This happens over and over during the night. This disrupts your sleep and keeps your body from getting the rest that it needs, which can cause tiredness and lack of energy (fatigue) during the day.  Sleep apnea treatment:  If you were given a device to open your airway while you sleep, USE IT!  Sleep hygiene:   Limit or avoid alcohol, caffeinated beverages, and cigarettes, especially close to bedtime.   Do not eat a large meal or eat spicy foods right before bedtime. This can lead to digestive discomfort that can make it hard for you to sleep.  Keep a sleep diary to help you and your health care provider figure out what could be causing your insomnia.  . Make your bedroom a dark, comfortable place where it is easy to fall asleep. ? Put up shades or blackout curtains to block light from outside. ? Use a white noise machine to block noise. ? Keep the temperature cool. . Limit screen use before bedtime. This includes: ? Watching TV. ? Using your smartphone, tablet, or computer. . Stick to a routine that includes going to bed and waking up at the same times every day and night. This can help you fall asleep faster. Consider making a quiet activity, such as reading, part of your nighttime routine. . Try to avoid taking naps during the day so that you sleep better at night. . Get out of bed if you are still awake after 15 minutes of trying to sleep. Keep the lights down, but try reading or doing a quiet activity. When you feel sleepy, go back to bed.  8. Depression screening Depression screen is negative today.  PHQ-9 is 4.  9. Class 3 severe obesity with serious comorbidity and body mass index (BMI) of 40.0 to 44.9 in adult, unspecified obesity type (HCC) Brena is currently in the action stage of change and her goal is to continue with weight loss efforts. I recommend Jatara begin the structured treatment plan as follows:  She has agreed to the Category 2 Plan.  Exercise goals: As is.   Behavioral modification strategies: increasing lean protein intake, decreasing simple carbohydrates, increasing vegetables, increasing water intake and decreasing liquid calories.  She was informed of the importance of  frequent follow-up visits to maximize her success with intensive lifestyle modifications for her multiple health conditions. She was informed we would discuss her lab results at her next visit unless there is a critical issue that needs to be addressed sooner. Aniston agreed to keep her next visit at the agreed upon time to discuss these results.  Objective:   Blood pressure 135/85, pulse 64, temperature 98.4 F (36.9 C), temperature source Oral, height 5\' 6"  (1.676 m), weight 275 lb (124.7 kg), last menstrual period 01/05/2011, SpO2 98 %. Body mass index is 44.39 kg/m.  EKG: Normal sinus rhythm, rate 63 bpm.  Indirect Calorimeter completed today shows a VO2 of 246 and a REE of 1712.  Her calculated basal metabolic rate is 99991111 thus her basal metabolic rate is worse than expected.  General: Cooperative, alert, well developed, in no acute distress. HEENT: Conjunctivae  and lids unremarkable. Cardiovascular: Regular rhythm.  Lungs: Normal work of breathing. Neurologic: No focal deficits.   Lab Results  Component Value Date   CREATININE 0.86 11/02/2019   BUN 21 11/02/2019   NA 138 11/02/2019   K 3.7 11/02/2019   CL 100 11/02/2019   CO2 24 11/02/2019   Lab Results  Component Value Date   ALT 17 11/02/2019   AST 15 11/02/2019   ALKPHOS 80 11/02/2019   BILITOT 0.2 11/02/2019   Lab Results  Component Value Date   HGBA1C 7.3 (H) 11/02/2019   HGBA1C 9.6 (A) 09/23/2018   HGBA1C 8.7 (A) 06/13/2018   HGBA1C 8.4 (H) 02/23/2018   HGBA1C 10.4 07/27/2017   Lab Results  Component Value Date   TSH 0.971 11/02/2019   Lab Results  Component Value Date   CHOL 234 (H) 11/02/2019   HDL 63 11/02/2019   LDLCALC 154 (H) 11/02/2019   LDLDIRECT 156.0 06/13/2018   TRIG 95 11/02/2019   CHOLHDL 5 09/23/2018   Lab Results  Component Value Date   WBC 7.1 11/02/2019   HGB 12.6 11/02/2019   HCT 39.3 11/02/2019   MCV 85 11/02/2019   PLT 309 11/02/2019   Lab Results  Component Value Date    IRON 72 11/02/2019   TIBC 363 11/02/2019   FERRITIN 111 11/02/2019   Attestation Statements:   This is the patient's first visit at [facility]. The patient's NEW PATIENT PACKET was reviewed at length. Included in the packet: current and past health history, medications, allergies, ROS, gynecologic history (women only), surgical history, family history, social history, weight history, weight loss surgery history (for those that have had weight loss surgery), nutritional evaluation, mood and food questionnaire, PHQ9, Epworth questionnaire, sleep habits questionnaire, patient life and health improvement goals questionnaire. These will all be scanned into the patient's chart under media.   During the visit, I independently reviewed the patient's EKG, bioimpedance scale results, and indirect calorimeter results. I used this information to tailor a meal plan for the patient that will help her to lose weight and will improve her obesity-related conditions going forward. I performed a medically necessary appropriate examination and/or evaluation. I discussed the assessment and treatment plan with the patient. The patient was provided an opportunity to ask questions and all were answered. The patient agreed with the plan and demonstrated an understanding of the instructions. Labs were ordered at this visit and will be reviewed at the next visit unless more critical results need to be addressed immediately. Clinical information was updated and documented in the EMR.   Time spent on visit including pre-visit chart review and post-visit care was 60 minutes.   I, Water quality scientist, CMA, am acting as Location manager for [employer], DO.  I have reviewed the above documentation for accuracy and completeness, and I agree with the above. Briscoe Deutscher, DO

## 2019-11-08 MED FILL — CHLORTHALIDONE 50 MG TABS: 50 | 30 days supply | Qty: 30 | Fill #2

## 2019-11-16 ENCOUNTER — Ambulatory Visit (INDEPENDENT_AMBULATORY_CARE_PROVIDER_SITE_OTHER): Payer: Self-pay | Admitting: Family Medicine

## 2019-11-29 ENCOUNTER — Other Ambulatory Visit: Payer: Self-pay

## 2019-11-29 ENCOUNTER — Ambulatory Visit (INDEPENDENT_AMBULATORY_CARE_PROVIDER_SITE_OTHER): Payer: 59 | Admitting: Family Medicine

## 2019-11-29 ENCOUNTER — Encounter (INDEPENDENT_AMBULATORY_CARE_PROVIDER_SITE_OTHER): Payer: Self-pay | Admitting: Family Medicine

## 2019-11-29 VITALS — BP 143/82 | HR 62 | Temp 98.4°F | Ht 66.0 in | Wt 271.0 lb

## 2019-11-29 DIAGNOSIS — E785 Hyperlipidemia, unspecified: Secondary | ICD-10-CM

## 2019-11-29 DIAGNOSIS — E1169 Type 2 diabetes mellitus with other specified complication: Secondary | ICD-10-CM | POA: Diagnosis not present

## 2019-11-29 DIAGNOSIS — Z6841 Body Mass Index (BMI) 40.0 and over, adult: Secondary | ICD-10-CM | POA: Diagnosis not present

## 2019-11-29 DIAGNOSIS — E559 Vitamin D deficiency, unspecified: Secondary | ICD-10-CM

## 2019-11-29 DIAGNOSIS — Z9189 Other specified personal risk factors, not elsewhere classified: Secondary | ICD-10-CM | POA: Diagnosis not present

## 2019-11-29 MED ORDER — VITAMIN D (ERGOCALCIFEROL) 1.25 MG (50000 UNIT) PO CAPS: 50000.0000 [IU] | ORAL_CAPSULE | ORAL | 0 refills | Status: DC

## 2019-11-29 MED FILL — VIT D2 1.25 MG (50,000 UNIT: 1.25 MG | 28 days supply | Qty: 4 | Fill #0

## 2019-11-29 MED FILL — UNIFINE PENTIPS 32GX5/32: 32G X 4 MM | 90 days supply | Qty: 100 | Fill #0

## 2019-12-05 NOTE — Progress Notes (Signed)
Chief Complaint:   OBESITY Rachel Dawson is here to discuss her progress with her obesity treatment plan along with follow-up of her obesity related diagnoses. Rachel Dawson is on the Category 2 Plan and states she is following her eating plan approximately 75-80% of the time. Rachel Dawson states she is walking for 15 minutes 2 times per week.  Today's visit was #: 2 Starting weight: 275 lbs Starting date: 11/02/2019 Today's weight: 271 lbs Today's date: 11/29/2019 Total lbs lost to date: 4 Total lbs lost since last in-office visit: 4  Interim History: Rachel Dawson has done well with weight loss. She had good support from her family. She struggled with meal planning and prepping at times.  Subjective:   1. Hyperlipidemia associated with type 2 diabetes mellitus ([place]) Rachel Dawson's LDL is still elevated, and she is not on statin. She would like to try to improve with diet. She denies chest pain. I discussed labs with the patient today.  2. Vitamin D deficiency Rachel Dawson's Vit D is low. She is not on Vit D currently, and she denies fatigue. I discussed labs with the patient today.  3. Type 2 diabetes mellitus with other specified complication, unspecified whether long term insulin use (Rachel Dawson) Rachel Dawson's A1c has greatly improved from 9.7 to 7.3 since her last labs. She is working on diet and denies hypoglycemia. I discussed labs with the patient today.  4. At risk for heart disease Rachel Dawson is at a higher than average risk for cardiovascular disease due to obesity, diabetes mellitus, hyperlipidemia, and hypertension.   Assessment/Plan:   1. Hyperlipidemia associated with type 2 diabetes mellitus ([place]) Cardiovascular risk and specific lipid/LDL goals reviewed. We discussed several lifestyle modifications today and Rachel Dawson will continue to work on diet, exercise and weight loss efforts. We will recheck labs in 3 months. Orders and follow up as documented in patient record.   2. Vitamin D deficiency Low Vitamin D  level contributes to fatigue and are associated with obesity, breast, and colon cancer. Rachel Dawson agreed to restart prescription Vitamin D 50,000 IU every week with no refills. She will follow-up for routine testing of Vitamin D, at least 2-3 times per year to avoid over-replacement. We will recheck labs in 3 months.  - Vitamin D, Ergocalciferol, (DRISDOL) 1.25 MG (50000 UNIT) CAPS capsule; Take 1 capsule (50,000 Units total) by mouth every 7 (seven) days.  Dispense: 4 capsule; Refill: 0  3. Type 2 diabetes mellitus with other specified complication, unspecified whether long term insulin use (Rachel Dawson) Rachel Dawson will continue with diet and exercise. Good blood sugar control is important to decrease the likelihood of diabetic complications such as nephropathy, neuropathy, limb loss, blindness, coronary artery disease, and death. Intensive lifestyle modification including diet, exercise and weight loss are the first line of treatment for diabetes. We will recheck labs in 3 months.  4. At risk for heart disease Rachel Dawson was given approximately 30 minutes of coronary artery disease prevention counseling today. She is 39 y.o. female and has risk factors for heart disease including obesity, diabetes mellitus, hyperlipidemia, and hypertension. We discussed intensive lifestyle modifications today with an emphasis on specific weight loss instructions and strategies.   Repetitive spaced learning was employed today to elicit superior memory formation and behavioral change.  5. Class 3 severe obesity with serious comorbidity and body mass index (BMI) of 40.0 to 44.9 in adult, unspecified obesity type (Rachel Dawson) Rachel Dawson is currently in the action stage of change. As such, her goal is to continue with weight loss efforts. She  has agreed to the Category 2 Plan or keeping a food journal and adhering to recommended goals of 1200-1400 calories and 75+ grams of protein daily.   Exercise goals: As is.  Behavioral modification  strategies: increasing lean protein intake, meal planning and cooking strategies and keeping a strict food journal.  Rachel Dawson has agreed to follow-up with our clinic in 2 weeks. She was informed of the importance of frequent follow-up visits to maximize her success with intensive lifestyle modifications for her multiple health conditions.   Objective:   Blood pressure (!) 143/82, pulse 62, temperature 98.4 F (36.9 C), temperature source Oral, height 5\' 6"  (1.676 m), weight 271 lb (122.9 kg), last menstrual period 01/05/2011, SpO2 99 %. Body mass index is 43.74 kg/m.  General: Cooperative, alert, well developed, in no acute distress. HEENT: Conjunctivae and lids unremarkable. Cardiovascular: Regular rhythm.  Lungs: Normal work of breathing. Neurologic: No focal deficits.   Lab Results  Component Value Date   CREATININE 0.86 11/02/2019   BUN 21 11/02/2019   NA 138 11/02/2019   K 3.7 11/02/2019   CL 100 11/02/2019   CO2 24 11/02/2019   Lab Results  Component Value Date   ALT 17 11/02/2019   AST 15 11/02/2019   ALKPHOS 80 11/02/2019   BILITOT 0.2 11/02/2019   Lab Results  Component Value Date   HGBA1C 7.3 (H) 11/02/2019   HGBA1C 9.6 (A) 09/23/2018   HGBA1C 8.7 (A) 06/13/2018   HGBA1C 8.4 (H) 02/23/2018   HGBA1C 10.4 07/27/2017   No results found for: INSULIN Lab Results  Component Value Date   TSH 0.971 11/02/2019   Lab Results  Component Value Date   CHOL 234 (H) 11/02/2019   HDL 63 11/02/2019   LDLCALC 154 (H) 11/02/2019   LDLDIRECT 156.0 06/13/2018   TRIG 95 11/02/2019   CHOLHDL 5 09/23/2018   Lab Results  Component Value Date   WBC 7.1 11/02/2019   HGB 12.6 11/02/2019   HCT 39.3 11/02/2019   MCV 85 11/02/2019   PLT 309 11/02/2019   Lab Results  Component Value Date   IRON 72 11/02/2019   TIBC 363 11/02/2019   FERRITIN 111 11/02/2019   Attestation Statements:   Reviewed by clinician on day of visit: allergies, medications, problem list, medical  history, surgical history, family history, social history, and previous encounter notes.   I, Trixie Dredge, am acting as transcriptionist for Dennard Nip, MD.  I have reviewed the above documentation for accuracy and completeness, and I agree with the above. -  Dennard Nip, MD

## 2019-12-08 MED FILL — CHLORTHALIDONE 50 MG TABS: 50 | 30 days supply | Qty: 30 | Fill #3

## 2019-12-13 ENCOUNTER — Ambulatory Visit (INDEPENDENT_AMBULATORY_CARE_PROVIDER_SITE_OTHER): Payer: 59 | Admitting: Family Medicine

## 2019-12-13 ENCOUNTER — Encounter (INDEPENDENT_AMBULATORY_CARE_PROVIDER_SITE_OTHER): Payer: Self-pay | Admitting: Family Medicine

## 2019-12-13 ENCOUNTER — Other Ambulatory Visit: Payer: Self-pay

## 2019-12-13 VITALS — BP 128/84 | HR 70 | Temp 98.1°F | Ht 66.0 in | Wt 275.0 lb

## 2019-12-13 DIAGNOSIS — Z9189 Other specified personal risk factors, not elsewhere classified: Secondary | ICD-10-CM | POA: Diagnosis not present

## 2019-12-13 DIAGNOSIS — E559 Vitamin D deficiency, unspecified: Secondary | ICD-10-CM

## 2019-12-13 DIAGNOSIS — E1169 Type 2 diabetes mellitus with other specified complication: Secondary | ICD-10-CM

## 2019-12-13 DIAGNOSIS — E785 Hyperlipidemia, unspecified: Secondary | ICD-10-CM

## 2019-12-13 DIAGNOSIS — Z6841 Body Mass Index (BMI) 40.0 and over, adult: Secondary | ICD-10-CM

## 2019-12-13 MED ORDER — VITAMIN D (ERGOCALCIFEROL) 1.25 MG (50000 UNIT) PO CAPS: 50000.0000 [IU] | ORAL_CAPSULE | ORAL | 0 refills | Status: DC

## 2019-12-13 NOTE — Progress Notes (Signed)
Chief Complaint:   OBESITY Rachel Dawson is here to discuss her progress with her obesity treatment plan along with follow-up of her obesity related diagnoses. Rachel Dawson is on the Category 2 Plan or keeping a food journal and adhering to recommended goals of 1200-1400 calories and 75+ grams of protein daily and states she is following her eating plan approximately 65-70% of the time. Rachel Dawson states she is doing 0 minutes 0 times per week.  Today's visit was #: 3 Starting weight: 275 lbs Starting date: 11/02/2019 Today's weight: 275 lbs Today's date: 12/13/2019 Total lbs lost to date: 0 Total lbs lost since last in-office visit: 0  Interim History: Rachel Dawson has been working long hours and has gotten off track. She has had decreased sleep and exercise due to this. She has gained 4 lbs but about half is water weight.  Subjective:   1. Vitamin D deficiency Rachel Dawson is stable on Vit D, but her level is not yet at goal. She denies nausea, vomiting, or muscle weakness.  2. Type 2 diabetes mellitus with other specified complication, unspecified whether long term insulin use (HCC) Rachel Dawson is working on lifestyle changes to help her diabetes mellitus. She denies hypoglycemia and she is tolerating her medications well.  3. Hyperlipidemia associated with type 2 diabetes mellitus ([place]) Rachel Dawson is not on statin. She went to a vitamin store and was advised to take [place], and she asks if this is a good idea.  4. At risk for hyperglycemia Rachel Dawson is at increased risk for hyperglycemia due to decrease in sleep.  Assessment/Plan:   1. Vitamin D deficiency Low Vitamin D level contributes to fatigue and are associated with obesity, breast, and colon cancer. We will refill prescription Vitamin D for 1 month. Alleta will follow-up for routine testing of Vitamin D, at least 2-3 times per year to avoid over-replacement.  - Vitamin D, Ergocalciferol, (DRISDOL) 1.25 MG (50000 UNIT) CAPS capsule; Take 1  capsule (50,000 Units total) by mouth every 7 (seven) days.  Dispense: 4 capsule; Refill: 0  2. Type 2 diabetes mellitus with other specified complication, unspecified whether long term insulin use (HCC) Good blood sugar control is important to decrease the likelihood of diabetic complications such as nephropathy, neuropathy, limb loss, blindness, coronary artery disease, and death. Intensive lifestyle modification including diet, exercise and weight loss are the first line of treatment for diabetes. Rachel Dawson will continue with diet and exercise, and will continue to follow up.  3. Hyperlipidemia associated with type 2 diabetes mellitus ([place]) Cardiovascular risk and specific lipid/LDL goals reviewed. Rachel Dawson was advised that this ([place] Yeast) isn't near as helpful as her weight loss efforts. We discussed several lifestyle modifications today. Rachel Dawson will continue to work on diet, exercise and weight loss efforts. We will recheck labs in 6 weeks. Orders and follow up as documented in patient record.   4. At risk for hyperglycemia Rachel Dawson was given approximately 15 minutes of counseling today regarding prevention of hyperglycemia. She was advised of hyperglycemia causes and the fact hyperglycemia is often asymptomatic. Rachel Dawson was instructed to avoid skipping meals, eat regular protein rich meals and schedule low calorie but protein rich snacks as needed.   Repetitive spaced learning was employed today to elicit superior memory formation and behavioral change  5. Class 3 severe obesity with serious comorbidity and body mass index (BMI) of 40.0 to 44.9 in adult, unspecified obesity type (HCC) Rachel Dawson is currently in the action stage of change. As such, her goal  is to continue with weight loss efforts. She has agreed to keeping a food journal and adhering to recommended goals of 1200-1400 calories and 75+ grams of protein daily.   Behavioral modification strategies: increasing lean protein intake,  meal planning and cooking strategies and keeping a strict food journal.  Rachel Dawson has agreed to follow-up with our clinic in 2 weeks. She was informed of the importance of frequent follow-up visits to maximize her success with intensive lifestyle modifications for her multiple health conditions.   Objective:   Blood pressure 128/84, pulse 70, temperature 98.1 F (36.7 C), temperature source Oral, height 5\' 6"  (1.676 m), weight 275 lb (124.7 kg), last menstrual period 01/05/2011, SpO2 98 %. Body mass index is 44.39 kg/m.  General: Cooperative, alert, well developed, in no acute distress. HEENT: Conjunctivae and lids unremarkable. Cardiovascular: Regular rhythm.  Lungs: Normal work of breathing. Neurologic: No focal deficits.   Lab Results  Component Value Date   CREATININE 0.86 11/02/2019   BUN 21 11/02/2019   NA 138 11/02/2019   K 3.7 11/02/2019   CL 100 11/02/2019   CO2 24 11/02/2019   Lab Results  Component Value Date   ALT 17 11/02/2019   AST 15 11/02/2019   ALKPHOS 80 11/02/2019   BILITOT 0.2 11/02/2019   Lab Results  Component Value Date   HGBA1C 7.3 (H) 11/02/2019   HGBA1C 9.6 (A) 09/23/2018   HGBA1C 8.7 (A) 06/13/2018   HGBA1C 8.4 (H) 02/23/2018   HGBA1C 10.4 07/27/2017   No results found for: INSULIN Lab Results  Component Value Date   TSH 0.971 11/02/2019   Lab Results  Component Value Date   CHOL 234 (H) 11/02/2019   HDL 63 11/02/2019   LDLCALC 154 (H) 11/02/2019   LDLDIRECT 156.0 06/13/2018   TRIG 95 11/02/2019   CHOLHDL 5 09/23/2018   Lab Results  Component Value Date   WBC 7.1 11/02/2019   HGB 12.6 11/02/2019   HCT 39.3 11/02/2019   MCV 85 11/02/2019   PLT 309 11/02/2019   Lab Results  Component Value Date   IRON 72 11/02/2019   TIBC 363 11/02/2019   FERRITIN 111 11/02/2019   Attestation Statements:   Reviewed by clinician on day of visit: allergies, medications, problem list, medical history, surgical history, family history, social  history, and previous encounter notes.   I, Trixie Dredge, am acting as transcriptionist for Dennard Nip, MD.  I have reviewed the above documentation for accuracy and completeness, and I agree with the above. -  Dennard Nip, MD

## 2019-12-25 ENCOUNTER — Encounter (INDEPENDENT_AMBULATORY_CARE_PROVIDER_SITE_OTHER): Payer: Self-pay | Admitting: Family Medicine

## 2019-12-25 ENCOUNTER — Ambulatory Visit (INDEPENDENT_AMBULATORY_CARE_PROVIDER_SITE_OTHER): Payer: 59 | Admitting: Family Medicine

## 2019-12-25 ENCOUNTER — Other Ambulatory Visit: Payer: Self-pay

## 2019-12-25 VITALS — BP 117/78 | HR 59 | Temp 98.3°F | Ht 66.0 in | Wt 275.0 lb

## 2019-12-25 DIAGNOSIS — Z6841 Body Mass Index (BMI) 40.0 and over, adult: Secondary | ICD-10-CM | POA: Diagnosis not present

## 2019-12-25 DIAGNOSIS — E559 Vitamin D deficiency, unspecified: Secondary | ICD-10-CM | POA: Diagnosis not present

## 2019-12-25 DIAGNOSIS — E1169 Type 2 diabetes mellitus with other specified complication: Secondary | ICD-10-CM

## 2019-12-25 NOTE — Progress Notes (Signed)
Chief Complaint:   OBESITY Iman is here to discuss her progress with her obesity treatment plan along with follow-up of her obesity related diagnoses. Ahniya is on keeping a food journal and adhering to recommended goals of 1200-1400 calories and 75+ grams of protein daily and states she is following her eating plan approximately 85% of the time. Cecilie states she is doing 0 minutes 0 times per week.  Today's visit was #: 4 Starting weight: 275 lbs Starting date: 11/02/2019 Today's weight: 275 lbs Today's date: 12/25/2019 Total lbs lost to date: 0 Total lbs lost since last in-office visit: 0  Interim History: Nakkia has been alternating between first and third shift, and routinely working >50 hours per week. Her family has been very supportive with healthy meal prep and cooking.  Subjective:   1. Vitamin D deficiency Jaelyn's Vit D level on 11/02/2019 was 20.6. She is on prescription strength Vit D supplementation, and is tolerating it well.  2. Type 2 diabetes mellitus with other specified complication, unspecified whether long term insulin use ([place]) Meron's home morning BGs range between 90 and 110's. She denies episodes of hypoglycemia. She is taking metformin 500 mg 2 tablets BID and Tresiba 28 units q daily. Her A1c on 11/02/2019 was 7.3.  Assessment/Plan:   1. Vitamin D deficiency Low Vitamin D level contributes to fatigue and are associated with obesity, breast, and colon cancer. Deneisha agreed to continue taking prescription Vitamin D 50,000 IU every week, no refill needed today. She will follow-up for routine testing of Vitamin D, at least 2-3 times per year to avoid over-replacement.  2. Type 2 diabetes mellitus with other specified complication, unspecified whether long term insulin use (HCC) Good blood sugar control is important to decrease the likelihood of diabetic complications such as nephropathy, neuropathy, limb loss, blindness, coronary artery disease, and  death. Intensive lifestyle modification including diet, exercise and weight loss are the first line of treatment for diabetes. Raylin will continue her current anti-diabetic regimen, and will continue her Category 2 meal plan.  3. Class 3 severe obesity with serious comorbidity and body mass index (BMI) of 40.0 to 44.9 in adult, unspecified obesity type (HCC) Havanah is currently in the action stage of change. As such, her goal is to continue with weight loss efforts. She has agreed to the Category 2 Plan.   Exercise goals: No exercise has been prescribed at this time.  Behavioral modification strategies: increasing lean protein intake, decreasing simple carbohydrates and better snacking choices.  Aidynn has agreed to follow-up with our clinic in 3 weeks. She was informed of the importance of frequent follow-up visits to maximize her success with intensive lifestyle modifications for her multiple health conditions.   Objective:   Blood pressure 117/78, pulse (!) 59, temperature 98.3 F (36.8 C), temperature source Oral, height 5\' 6"  (1.676 m), weight 275 lb (124.7 kg), last menstrual period 01/05/2011, SpO2 100 %. Body mass index is 44.39 kg/m.  General: Cooperative, alert, well developed, in no acute distress. HEENT: Conjunctivae and lids unremarkable. Cardiovascular: Regular rhythm.  Lungs: Normal work of breathing. Neurologic: No focal deficits.   Lab Results  Component Value Date   CREATININE 0.86 11/02/2019   BUN 21 11/02/2019   NA 138 11/02/2019   K 3.7 11/02/2019   CL 100 11/02/2019   CO2 24 11/02/2019   Lab Results  Component Value Date   ALT 17 11/02/2019   AST 15 11/02/2019   ALKPHOS 80 11/02/2019   BILITOT  0.2 11/02/2019   Lab Results  Component Value Date   HGBA1C 7.3 (H) 11/02/2019   HGBA1C 9.6 (A) 09/23/2018   HGBA1C 8.7 (A) 06/13/2018   HGBA1C 8.4 (H) 02/23/2018   HGBA1C 10.4 07/27/2017   No results found for: INSULIN Lab Results  Component Value  Date   TSH 0.971 11/02/2019   Lab Results  Component Value Date   CHOL 234 (H) 11/02/2019   HDL 63 11/02/2019   LDLCALC 154 (H) 11/02/2019   LDLDIRECT 156.0 06/13/2018   TRIG 95 11/02/2019   CHOLHDL 5 09/23/2018   Lab Results  Component Value Date   WBC 7.1 11/02/2019   HGB 12.6 11/02/2019   HCT 39.3 11/02/2019   MCV 85 11/02/2019   PLT 309 11/02/2019   Lab Results  Component Value Date   IRON 72 11/02/2019   TIBC 363 11/02/2019   FERRITIN 111 11/02/2019   Attestation Statements:   Reviewed by clinician on day of visit: allergies, medications, problem list, medical history, surgical history, family history, social history, and previous encounter notes.  Time spent on visit including pre-visit chart review and post-visit care and charting was 26 minutes.    I, Trixie Dredge, am acting as transcriptionist for Dennard Nip, MD.  I have reviewed the above documentation for accuracy and completeness, and I agree with the above. -  Dennard Nip, MD

## 2019-12-27 MED FILL — TRESIBA FLEXTOUCH 200 UNITS: 200 | 85 days supply | Qty: 12 | Fill #1

## 2020-01-09 MED FILL — metFORMIN HCL ER 500 MG TB2: 500 | 90 days supply | Qty: 360 | Fill #0

## 2020-01-09 MED FILL — CHLORTHALIDONE 50 MG TABS: 50 | 30 days supply | Qty: 30 | Fill #4

## 2020-01-15 ENCOUNTER — Ambulatory Visit (INDEPENDENT_AMBULATORY_CARE_PROVIDER_SITE_OTHER): Payer: 59 | Admitting: Family Medicine

## 2020-01-15 ENCOUNTER — Encounter (INDEPENDENT_AMBULATORY_CARE_PROVIDER_SITE_OTHER): Payer: Self-pay | Admitting: Family Medicine

## 2020-01-15 ENCOUNTER — Other Ambulatory Visit: Payer: Self-pay

## 2020-01-15 VITALS — BP 119/76 | HR 65 | Temp 98.5°F | Ht 66.0 in | Wt 277.0 lb

## 2020-01-15 DIAGNOSIS — E1169 Type 2 diabetes mellitus with other specified complication: Secondary | ICD-10-CM

## 2020-01-15 DIAGNOSIS — Z6841 Body Mass Index (BMI) 40.0 and over, adult: Secondary | ICD-10-CM | POA: Diagnosis not present

## 2020-01-15 DIAGNOSIS — Z794 Long term (current) use of insulin: Secondary | ICD-10-CM | POA: Diagnosis not present

## 2020-01-16 NOTE — Progress Notes (Signed)
Chief Complaint:   OBESITY Kani is here to discuss her progress with her obesity treatment plan along with follow-up of her obesity related diagnoses. Phila is on the Category 2 Plan and states she is following her eating plan approximately 80% of the time. Qianna states she is on the treadmill for 15 minutes 1-2 times per week.  Today's visit was #: 5 Starting weight: 275 lbs Starting date: 11/02/2019 Today's weight: 277 lbs Today's date: 01/15/2020 Total lbs lost to date: 0 Total lbs lost since last in-office visit: 0  Interim History: Dede did some celebration eating over her birthday. She has gained 2 lbs of water weight. She now works 3rd shift. She has started to exercise and increase her water intake.  Subjective:   1. Type 2 diabetes mellitus with other specified complication, with long-term current use of insulin (HCC) Shamaine states her fasting BGs range between 110 and 130's. She denies any hypoglycemia. Her last A1c was 7.3.  Assessment/Plan:   1. Type 2 diabetes mellitus with other specified complication, with long-term current use of insulin (HCC) Good blood sugar control is important to decrease the likelihood of diabetic complications such as nephropathy, neuropathy, limb loss, blindness, coronary artery disease, and death. Intensive lifestyle modification including diet, exercise and weight loss are the first line of treatment for diabetes. Samie will continue diet and exercise, and she is to watch for low blood sugars.  2. Class 3 severe obesity with serious comorbidity and body mass index (BMI) of 40.0 to 44.9 in adult, unspecified obesity type (HCC) Tida is currently in the action stage of change. As such, her goal is to continue with weight loss efforts. She has agreed to the Category 2 Plan.   [employer] handout was given today.  Exercise goals: As is.  Behavioral modification strategies: meal planning and cooking strategies.  Henchy has  agreed to follow-up with our clinic in 2 to 3 weeks. She was informed of the importance of frequent follow-up visits to maximize her success with intensive lifestyle modifications for her multiple health conditions.   Objective:   Blood pressure 119/76, pulse 65, temperature 98.5 F (36.9 C), temperature source Oral, height 5\' 6"  (1.676 m), weight 277 lb (125.6 kg), last menstrual period 01/05/2011, SpO2 99 %. Body mass index is 44.71 kg/m.  General: Cooperative, alert, well developed, in no acute distress. HEENT: Conjunctivae and lids unremarkable. Cardiovascular: Regular rhythm.  Lungs: Normal work of breathing. Neurologic: No focal deficits.   Lab Results  Component Value Date   CREATININE 0.86 11/02/2019   BUN 21 11/02/2019   NA 138 11/02/2019   K 3.7 11/02/2019   CL 100 11/02/2019   CO2 24 11/02/2019   Lab Results  Component Value Date   ALT 17 11/02/2019   AST 15 11/02/2019   ALKPHOS 80 11/02/2019   BILITOT 0.2 11/02/2019   Lab Results  Component Value Date   HGBA1C 7.3 (H) 11/02/2019   HGBA1C 9.6 (A) 09/23/2018   HGBA1C 8.7 (A) 06/13/2018   HGBA1C 8.4 (H) 02/23/2018   HGBA1C 10.4 07/27/2017   No results found for: INSULIN Lab Results  Component Value Date   TSH 0.971 11/02/2019   Lab Results  Component Value Date   CHOL 234 (H) 11/02/2019   HDL 63 11/02/2019   LDLCALC 154 (H) 11/02/2019   LDLDIRECT 156.0 06/13/2018   TRIG 95 11/02/2019   CHOLHDL 5 09/23/2018   Lab Results  Component Value Date   WBC 7.1  11/02/2019   HGB 12.6 11/02/2019   HCT 39.3 11/02/2019   MCV 85 11/02/2019   PLT 309 11/02/2019   Lab Results  Component Value Date   IRON 72 11/02/2019   TIBC 363 11/02/2019   FERRITIN 111 11/02/2019   Attestation Statements:   Reviewed by clinician on day of visit: allergies, medications, problem list, medical history, surgical history, family history, social history, and previous encounter notes.  Time spent on visit including pre-visit  chart review and post-visit care and charting was 21 minutes.    I, Trixie Dredge, am acting as transcriptionist for Dennard Nip, MD.  I have reviewed the above documentation for accuracy and completeness, and I agree with the above. -  Dennard Nip, MD

## 2020-01-24 DIAGNOSIS — H35031 Hypertensive retinopathy, right eye: Secondary | ICD-10-CM | POA: Diagnosis not present

## 2020-01-24 DIAGNOSIS — H52223 Regular astigmatism, bilateral: Secondary | ICD-10-CM | POA: Diagnosis not present

## 2020-01-24 DIAGNOSIS — I10 Essential (primary) hypertension: Secondary | ICD-10-CM | POA: Diagnosis not present

## 2020-01-24 DIAGNOSIS — H5213 Myopia, bilateral: Secondary | ICD-10-CM | POA: Diagnosis not present

## 2020-01-29 ENCOUNTER — Encounter (INDEPENDENT_AMBULATORY_CARE_PROVIDER_SITE_OTHER): Payer: Self-pay | Admitting: Family Medicine

## 2020-01-29 NOTE — Telephone Encounter (Signed)
Please r/s

## 2020-01-31 ENCOUNTER — Ambulatory Visit (INDEPENDENT_AMBULATORY_CARE_PROVIDER_SITE_OTHER): Payer: Self-pay | Admitting: Family Medicine

## 2020-02-01 ENCOUNTER — Other Ambulatory Visit: Payer: Self-pay

## 2020-02-01 ENCOUNTER — Ambulatory Visit (INDEPENDENT_AMBULATORY_CARE_PROVIDER_SITE_OTHER): Payer: 59 | Admitting: Family Medicine

## 2020-02-01 ENCOUNTER — Encounter (INDEPENDENT_AMBULATORY_CARE_PROVIDER_SITE_OTHER): Payer: Self-pay | Admitting: Family Medicine

## 2020-02-01 VITALS — BP 124/74 | HR 58 | Temp 98.4°F | Ht 66.0 in | Wt 282.0 lb

## 2020-02-01 DIAGNOSIS — E559 Vitamin D deficiency, unspecified: Secondary | ICD-10-CM

## 2020-02-01 DIAGNOSIS — Z9189 Other specified personal risk factors, not elsewhere classified: Secondary | ICD-10-CM | POA: Diagnosis not present

## 2020-02-01 DIAGNOSIS — F3289 Other specified depressive episodes: Secondary | ICD-10-CM

## 2020-02-01 DIAGNOSIS — Z6841 Body Mass Index (BMI) 40.0 and over, adult: Secondary | ICD-10-CM | POA: Diagnosis not present

## 2020-02-01 MED ORDER — VITAMIN D (ERGOCALCIFEROL) 1.25 MG (50000 UNIT) PO CAPS: 50000.0000 [IU] | ORAL_CAPSULE | ORAL | 0 refills | Status: DC

## 2020-02-01 MED ORDER — BUPROPION HCL ER (SR) 150 MG PO TB12: 150.0000 mg | ORAL_TABLET | Freq: Every day | ORAL | 0 refills | Status: DC

## 2020-02-01 MED FILL — BUPROPION HCL SR 150 MG TAB: 150 | 30 days supply | Qty: 30 | Fill #0

## 2020-02-01 MED FILL — VIT D2 1.25 MG (50,000 UNIT: 1.25 MG | 28 days supply | Qty: 4 | Fill #0

## 2020-02-05 NOTE — Progress Notes (Signed)
Chief Complaint:   OBESITY Rachel Dawson is here to discuss her progress with her obesity treatment plan along with follow-up of her obesity related diagnoses. Rachel Dawson is on the Category 2 Plan and states she is following her eating plan approximately 75-80% of the time. Rachel Dawson states she is doing 0 minutes 0 times per week.  Today's visit was #: 6 Starting weight: 275 lbs Starting date: 11/02/2019 Today's weight: 282 lbs Today's date: 02/01/2020 Total lbs lost to date: 0 Total lbs lost since last in-office visit: 0  Interim History: Rachel Dawson is struggling to stay on track with her eating plan. She is going to school and working 2 jobs including the 3rd shift, and she is exhausted and feeling overwhelmed.   Subjective:   1. Vitamin D deficiency Rachel Dawson is stable on Vit D, but her level is not yet at goal. She requests a refill today.  2. Other depression, with emotional eating Rachel Dawson notes increased stress and decreased ability to meal plan. She feels overwhelmed at times which makes taking care of her health more difficult.  3. At risk for impaired metabolic function Rachel Dawson is at increased risk for impaired metabolic function due to decreased protein.  Assessment/Plan:   1. Vitamin D deficiency Low Vitamin D level contributes to fatigue and are associated with obesity, breast, and colon cancer. We will refill prescription Vitamin D for 1 month. Rachel Dawson will follow-up for routine testing of Vitamin D, at least 2-3 times per year to avoid over-replacement.  - Vitamin D, Ergocalciferol, (DRISDOL) 1.25 MG (50000 UNIT) CAPS capsule; Take 1 capsule (50,000 Units total) by mouth every 7 (seven) days.  Dispense: 4 capsule; Refill: 0  2. Other depression, with emotional eating Behavior modification techniques were discussed today to help Rachel Dawson deal with her emotional/non-hunger eating behaviors. Rachel Dawson agreed to start Wellbutrin SR 150 mg daily with no refills, and will follow up in 3  to 4 weeks. Orders and follow up as documented in patient record.   - buPROPion (WELLBUTRIN SR) 150 MG 12 hr tablet; Take 1 tablet (150 mg total) by mouth daily.  Dispense: 30 tablet; Refill: 0  3. At risk for impaired metabolic function Rachel Dawson was given approximately 15 minutes of impaired  metabolic function prevention counseling today. We discussed intensive lifestyle modifications today with an emphasis on specific nutrition and exercise instructions and strategies.   Repetitive spaced learning was employed today to elicit superior memory formation and behavioral change.  4. Class 3 severe obesity with serious comorbidity and body mass index (BMI) of 40.0 to 44.9 in adult, unspecified obesity type (HCC) Rachel Dawson is currently in the action stage of change. As such, her goal is to continue with weight loss efforts. She has agreed to change to following a lower carbohydrate, vegetable and lean protein rich diet plan.   Rachel Dawson is to change a plan that requires less planning and increase ability to grab and go when needed.   Behavioral modification strategies: increasing lean protein intake and increasing water intake.  Rachel Dawson has agreed to follow-up with our clinic in 3 to 4 weeks. She was informed of the importance of frequent follow-up visits to maximize her success with intensive lifestyle modifications for her multiple health conditions.   Objective:   Blood pressure 124/74, pulse (!) 58, temperature 98.4 F (36.9 C), temperature source Oral, height 5\' 6"  (1.676 m), weight 282 lb (127.9 kg), last menstrual period 01/05/2011, SpO2 99 %. Body mass index is 45.52 kg/m.  General: Cooperative, alert,  well developed, in no acute distress. HEENT: Conjunctivae and lids unremarkable. Cardiovascular: Regular rhythm.  Lungs: Normal work of breathing. Neurologic: No focal deficits.   Lab Results  Component Value Date   CREATININE 0.86 11/02/2019   BUN 21 11/02/2019   NA 138 11/02/2019    K 3.7 11/02/2019   CL 100 11/02/2019   CO2 24 11/02/2019   Lab Results  Component Value Date   ALT 17 11/02/2019   AST 15 11/02/2019   ALKPHOS 80 11/02/2019   BILITOT 0.2 11/02/2019   Lab Results  Component Value Date   HGBA1C 7.3 (H) 11/02/2019   HGBA1C 9.6 (A) 09/23/2018   HGBA1C 8.7 (A) 06/13/2018   HGBA1C 8.4 (H) 02/23/2018   HGBA1C 10.4 07/27/2017   No results found for: INSULIN Lab Results  Component Value Date   TSH 0.971 11/02/2019   Lab Results  Component Value Date   CHOL 234 (H) 11/02/2019   HDL 63 11/02/2019   LDLCALC 154 (H) 11/02/2019   LDLDIRECT 156.0 06/13/2018   TRIG 95 11/02/2019   CHOLHDL 5 09/23/2018   Lab Results  Component Value Date   WBC 7.1 11/02/2019   HGB 12.6 11/02/2019   HCT 39.3 11/02/2019   MCV 85 11/02/2019   PLT 309 11/02/2019   Lab Results  Component Value Date   IRON 72 11/02/2019   TIBC 363 11/02/2019   FERRITIN 111 11/02/2019   Attestation Statements:   Reviewed by clinician on day of visit: allergies, medications, problem list, medical history, surgical history, family history, social history, and previous encounter notes.   I, Trixie Dredge, am acting as transcriptionist for Dennard Nip, MD.  I have reviewed the above documentation for accuracy and completeness, and I agree with the above. -  Dennard Nip, MD

## 2020-02-07 ENCOUNTER — Other Ambulatory Visit: Payer: Self-pay | Admitting: Family Medicine

## 2020-02-07 ENCOUNTER — Encounter: Payer: Self-pay | Admitting: Family Medicine

## 2020-02-07 DIAGNOSIS — I10 Essential (primary) hypertension: Secondary | ICD-10-CM

## 2020-02-07 MED FILL — CHLORTHALIDONE 50 MG TABS: 50 | 90 days supply | Qty: 90 | Fill #0

## 2020-02-22 DIAGNOSIS — A609 Anogenital herpesviral infection, unspecified: Secondary | ICD-10-CM | POA: Diagnosis not present

## 2020-02-22 DIAGNOSIS — I10 Essential (primary) hypertension: Secondary | ICD-10-CM | POA: Diagnosis not present

## 2020-02-22 DIAGNOSIS — Z1231 Encounter for screening mammogram for malignant neoplasm of breast: Secondary | ICD-10-CM | POA: Diagnosis not present

## 2020-02-22 DIAGNOSIS — Z91040 Latex allergy status: Secondary | ICD-10-CM | POA: Diagnosis not present

## 2020-02-22 DIAGNOSIS — Z124 Encounter for screening for malignant neoplasm of cervix: Secondary | ICD-10-CM | POA: Diagnosis not present

## 2020-02-22 DIAGNOSIS — Z113 Encounter for screening for infections with a predominantly sexual mode of transmission: Secondary | ICD-10-CM | POA: Diagnosis not present

## 2020-02-22 DIAGNOSIS — A630 Anogenital (venereal) warts: Secondary | ICD-10-CM | POA: Diagnosis not present

## 2020-02-22 DIAGNOSIS — E119 Type 2 diabetes mellitus without complications: Secondary | ICD-10-CM | POA: Diagnosis not present

## 2020-02-22 DIAGNOSIS — D649 Anemia, unspecified: Secondary | ICD-10-CM | POA: Diagnosis not present

## 2020-02-28 ENCOUNTER — Ambulatory Visit (INDEPENDENT_AMBULATORY_CARE_PROVIDER_SITE_OTHER): Payer: 59 | Admitting: Family Medicine

## 2020-02-28 ENCOUNTER — Other Ambulatory Visit: Payer: Self-pay

## 2020-02-28 ENCOUNTER — Encounter (INDEPENDENT_AMBULATORY_CARE_PROVIDER_SITE_OTHER): Payer: Self-pay | Admitting: Family Medicine

## 2020-02-28 VITALS — BP 131/84 | HR 67 | Temp 98.1°F | Ht 66.0 in | Wt 286.0 lb

## 2020-02-28 DIAGNOSIS — E1169 Type 2 diabetes mellitus with other specified complication: Secondary | ICD-10-CM

## 2020-02-28 DIAGNOSIS — E559 Vitamin D deficiency, unspecified: Secondary | ICD-10-CM | POA: Diagnosis not present

## 2020-02-28 DIAGNOSIS — Z9189 Other specified personal risk factors, not elsewhere classified: Secondary | ICD-10-CM

## 2020-02-28 DIAGNOSIS — F3289 Other specified depressive episodes: Secondary | ICD-10-CM | POA: Diagnosis not present

## 2020-02-28 DIAGNOSIS — Z6841 Body Mass Index (BMI) 40.0 and over, adult: Secondary | ICD-10-CM

## 2020-02-28 MED ORDER — BUPROPION HCL ER (SR) 150 MG PO TB12: 150.0000 mg | ORAL_TABLET | Freq: Every day | ORAL | 0 refills | Status: DC

## 2020-02-28 MED ORDER — VITAMIN D (ERGOCALCIFEROL) 1.25 MG (50000 UNIT) PO CAPS: 50000.0000 [IU] | ORAL_CAPSULE | ORAL | 0 refills | Status: DC

## 2020-02-28 MED FILL — VIT D2 1.25 MG (50,000 UNIT: 1.25 MG | 28 days supply | Qty: 4 | Fill #0

## 2020-02-28 MED FILL — BUPROPION HCL SR 150 MG TAB: 150 | 30 days supply | Qty: 30 | Fill #0

## 2020-02-29 NOTE — Progress Notes (Signed)
Chief Complaint:   OBESITY Rachel Dawson is here to discuss her progress with her obesity treatment plan along with follow-up of her obesity related diagnoses. Roxanne is on following a lower carbohydrate, vegetable and lean protein rich diet plan and states she is following her eating plan approximately 60% of the time. Rachel Dawson states she is walking for 20-30 minutes 2 times per week.  Today's visit was #: 7 Starting weight: 275 lbs Starting date: 11/02/2019 Today's weight: 286 lbs Today's date: 02/28/2020 Total lbs lost to date: 0 Total lbs lost since last in-office visit: 0  Interim History: Rachel Dawson has been overwhelmed with work and she hasn't been able to concentrate on weight loss. She is skipping meals due to no time to eat and protein has likely dropped below goal.  Subjective:   1. Other depression, with emotional eating Rachel Dawson is stable on Wellbutrin, but she still has a lot of stress at work but things will be changing soon and this should help.  2. Vitamin D deficiency Rachel Dawson is stable on Vit D, and she denies nausea or vomiting. Her Vit D level is not yet at goal.  3. Type 2 diabetes mellitus with other specified complication, without long-term current use of insulin (HCC) Rachel Dawson is on [place], and she denies hypoglycemia. Last A1c was 7.3. She is due for labs soon. She did not bring her BGs log today.  4. At risk for heart disease Rachel Dawson is at a higher than average risk for cardiovascular disease due to obesity.   Assessment/Plan:   1. Other depression, with emotional eating Behavior modification techniques were discussed today to help Rachel Dawson deal with her emotional/non-hunger eating behaviors. We will refill Wellbutrin SR for 1 month. Orders and follow up as documented in patient record.   - buPROPion (WELLBUTRIN SR) 150 MG 12 hr tablet; Take 1 tablet (150 mg total) by mouth daily.  Dispense: 30 tablet; Refill: 0  2. Vitamin D deficiency Low Vitamin D level  contributes to fatigue and are associated with obesity, breast, and colon cancer. We will refill prescription Vitamin D for 1 month. Rachel Dawson will follow-up for routine testing of Vitamin D, at least 2-3 times per year to avoid over-replacement.  - Vitamin D, Ergocalciferol, (DRISDOL) 1.25 MG (50000 UNIT) CAPS capsule; Take 1 capsule (50,000 Units total) by mouth every 7 (seven) days.  Dispense: 4 capsule; Refill: 0  3. Type 2 diabetes mellitus with other specified complication, without long-term current use of insulin (HCC) Good blood sugar control is important to decrease the likelihood of diabetic complications such as nephropathy, neuropathy, limb loss, blindness, coronary artery disease, and death. Intensive lifestyle modification including diet, exercise and weight loss are the first line of treatment for diabetes. Rachel Dawson will continue diet and exercise, and we will recheck labs in 1 month.  4. At risk for heart disease Rachel Dawson was given approximately 15 minutes of coronary artery disease prevention counseling today. She is 39 y.o. female and has risk factors for heart disease including obesity. We discussed intensive lifestyle modifications today with an emphasis on specific weight loss instructions and strategies.   Repetitive spaced learning was employed today to elicit superior memory formation and behavioral change.  5. Class 3 severe obesity with serious comorbidity and body mass index (BMI) of 45.0 to 49.9 in adult, unspecified obesity type (HCC) Rachel Dawson is currently in the action stage of change. As such, her goal is to maintain weight for now. She has agreed to practicing portion control and  making smarter food choices, such as increasing vegetables and decreasing simple carbohydrates.   Rachel Dawson is to work on maintaining hr weight for now, and when her job changes in 1 month we will work on weight loss again.  Exercise goals: As is.  Behavioral modification strategies: increasing  lean protein intake and no skipping meals.  Rachel Dawson has agreed to follow-up with our clinic in 4 weeks. She was informed of the importance of frequent follow-up visits to maximize her success with intensive lifestyle modifications for her multiple health conditions.   Objective:   Blood pressure 131/84, pulse 67, temperature 98.1 F (36.7 C), temperature source Oral, height 5\' 6"  (1.676 m), weight 286 lb (129.7 kg), last menstrual period 01/05/2011, SpO2 99 %. Body mass index is 46.16 kg/m.  General: Cooperative, alert, well developed, in no acute distress. HEENT: Conjunctivae and lids unremarkable. Cardiovascular: Regular rhythm.  Lungs: Normal work of breathing. Neurologic: No focal deficits.   Lab Results  Component Value Date   CREATININE 0.86 11/02/2019   BUN 21 11/02/2019   NA 138 11/02/2019   K 3.7 11/02/2019   CL 100 11/02/2019   CO2 24 11/02/2019   Lab Results  Component Value Date   ALT 17 11/02/2019   AST 15 11/02/2019   ALKPHOS 80 11/02/2019   BILITOT 0.2 11/02/2019   Lab Results  Component Value Date   HGBA1C 7.3 (H) 11/02/2019   HGBA1C 9.6 (A) 09/23/2018   HGBA1C 8.7 (A) 06/13/2018   HGBA1C 8.4 (H) 02/23/2018   HGBA1C 10.4 07/27/2017   No results found for: INSULIN Lab Results  Component Value Date   TSH 0.971 11/02/2019   Lab Results  Component Value Date   CHOL 234 (H) 11/02/2019   HDL 63 11/02/2019   LDLCALC 154 (H) 11/02/2019   LDLDIRECT 156.0 06/13/2018   TRIG 95 11/02/2019   CHOLHDL 5 09/23/2018   Lab Results  Component Value Date   WBC 7.1 11/02/2019   HGB 12.6 11/02/2019   HCT 39.3 11/02/2019   MCV 85 11/02/2019   PLT 309 11/02/2019   Lab Results  Component Value Date   IRON 72 11/02/2019   TIBC 363 11/02/2019   FERRITIN 111 11/02/2019   Attestation Statements:   Reviewed by clinician on day of visit: allergies, medications, problem list, medical history, surgical history, family history, social history, and previous  encounter notes.   I, Trixie Dredge, am acting as transcriptionist for Dennard Nip, MD.  I have reviewed the above documentation for accuracy and completeness, and I agree with the above. -  Dennard Nip, MD

## 2020-03-18 ENCOUNTER — Ambulatory Visit (INDEPENDENT_AMBULATORY_CARE_PROVIDER_SITE_OTHER): Payer: 59 | Admitting: Family Medicine

## 2020-03-18 MED FILL — UNIFINE PENTIPS 32GX5/32: 32G X 4 MM | 90 days supply | Qty: 100 | Fill #1

## 2020-03-18 MED FILL — BUPROPION HCL SR 150 MG TAB: 150 | 30 days supply | Qty: 30 | Fill #0

## 2020-03-18 MED FILL — VIT D2 1.25 MG (50,000 UNIT: 1.25 MG | 28 days supply | Qty: 4 | Fill #0

## 2020-03-25 ENCOUNTER — Telehealth: Payer: Self-pay | Admitting: Internal Medicine

## 2020-03-25 MED ORDER — TRESIBA FLEXTOUCH 200 UNIT/ML ~~LOC~~ SOPN: 28.0000 [IU] | PEN_INJECTOR | Freq: Every day | SUBCUTANEOUS | 0 refills | Status: DC

## 2020-03-25 MED FILL — TRESIBA FLEXTOUCH 200 UNITS: 200 | 21 days supply | Qty: 3 | Fill #0

## 2020-03-25 NOTE — Telephone Encounter (Signed)
Medication Refill Request  Did you call your pharmacy and request this refill first? Yes   If patient has not contacted pharmacy first, instruct them to do so for future refills.   Remind them that contacting the pharmacy for their refill is the quickest method to get the refill.   Refill policy also stated that it will take anywhere between 24-72 hours to receive the refill.    Name of medication? tresiba   Is this a 90 day supply?   Name and location of pharmacy? [facility], [place] Phone:  [phone]  Fax:  [phone]

## 2020-03-25 NOTE — Telephone Encounter (Signed)
Chart note reviewed.  Last OV with Dr. Cruzita Lederer June 2020 with instructions to follow up in 4 months. Patient did not follow up as recommended but since she does have appointment next month, Dr. Cruzita Lederer will give her a 30 day supply to hold her over until she is actually seen in clinic.  30 supply sent.

## 2020-04-03 ENCOUNTER — Ambulatory Visit (INDEPENDENT_AMBULATORY_CARE_PROVIDER_SITE_OTHER): Payer: 59 | Admitting: Family Medicine

## 2020-04-14 DIAGNOSIS — K611 Rectal abscess: Secondary | ICD-10-CM | POA: Insufficient documentation

## 2020-04-18 DIAGNOSIS — E1122 Type 2 diabetes mellitus with diabetic chronic kidney disease: Secondary | ICD-10-CM | POA: Insufficient documentation

## 2020-04-24 ENCOUNTER — Ambulatory Visit (INDEPENDENT_AMBULATORY_CARE_PROVIDER_SITE_OTHER): Payer: 59 | Admitting: Family Medicine

## 2020-04-30 ENCOUNTER — Other Ambulatory Visit: Payer: Self-pay

## 2020-04-30 ENCOUNTER — Ambulatory Visit (INDEPENDENT_AMBULATORY_CARE_PROVIDER_SITE_OTHER): Payer: No Typology Code available for payment source | Admitting: Internal Medicine

## 2020-04-30 ENCOUNTER — Encounter: Payer: Self-pay | Admitting: Internal Medicine

## 2020-04-30 VITALS — BP 132/84 | HR 64 | Ht 66.0 in | Wt 288.0 lb

## 2020-04-30 DIAGNOSIS — E785 Hyperlipidemia, unspecified: Secondary | ICD-10-CM

## 2020-04-30 DIAGNOSIS — Z6841 Body Mass Index (BMI) 40.0 and over, adult: Secondary | ICD-10-CM

## 2020-04-30 DIAGNOSIS — Z794 Long term (current) use of insulin: Secondary | ICD-10-CM

## 2020-04-30 DIAGNOSIS — E1165 Type 2 diabetes mellitus with hyperglycemia: Secondary | ICD-10-CM

## 2020-04-30 LAB — POCT GLYCOSYLATED HEMOGLOBIN (HGB A1C): Hemoglobin A1C: 7.8 % — AB (ref 4.0–5.6)

## 2020-04-30 MED ORDER — TRESIBA FLEXTOUCH 200 UNIT/ML ~~LOC~~ SOPN: 28.0000 [IU] | PEN_INJECTOR | Freq: Every day | SUBCUTANEOUS | 3 refills | Status: DC

## 2020-04-30 NOTE — Addendum Note (Signed)
Addended by: Cardell Peach I on: 04/30/2020 10:53 AM   Modules accepted: Orders

## 2020-04-30 NOTE — Progress Notes (Addendum)
Patient ID: Rachel Dawson, female   DOB: 1981/07/11, 39 y.o.   MRN: [id number]   This visit occurred during the SARS-CoV-2 public health emergency.  Safety protocols were in place, including screening questions prior to the visit, additional usage of staff PPE, and extensive cleaning of exam room while observing appropriate contact time as indicated for disinfecting solutions.   HPI: Rachel Dawson is a 39 y.o.-year-old female, presenting-year-old female, presenting for follow-up for DM2, dx in 2014, insulin-dependent, uncontrolled, without long term complications (but with hyperglycemia, yeast infections).  Last visit 1 year and 4 months ago.  She recently had a perirectal abscess IND'd earlier this month. She was off Metformin then. She had a low potassium (2.7) >> stopped Chlorthalidone and started Carvedilol 6.25 mg 2x a day.  She changed to 3rd shift >> sugars worse >> now not working >> however, will return to day shift soon.  Last hemoglobin A1c was: 04/14/2020: HbA1c 6.7% Lab Results  Component Value Date   HGBA1C 7.3 (H) 11/02/2019   HGBA1C 9.6 (A) 09/23/2018   HGBA1C 8.7 (A) 06/13/2018  09/23/2018: HbA1c calculated from fructosamine is 8.2%, lower than the measured one  but higher than before. 06/13/2018: HbA1c from fructosamine: 7.44%  Pt is on a regimen of: - Metformin ER 1000 mg 2x a day with meals - Basaglar 22 >> 28 units at bedtime >> Tresiba 28 units at bedtime She was on Victoza >> pancreatitis. She had nausea and diarrhea from regular metformin. She was on glimepiride 4 mg before dinner but ran out 08/2018 her last visit and we did not restart.   Pt checks her sugars twice a day: - am: 125-154, 167 >> 100-106 >> 79-114 >> 90-110 before hosp.  - now: 109-115 - 2h after b'fast: n/c >> 90-143, 164 >> 125-130 - before lunch: n/c - 2h after lunch: n/c >> 100-139 >> n/c - before dinner: 135-156 >> 130-140s >> 96-129, 133 >> 110 - 2h after dinner: n/c >> 114-157 >> 138-140 - bedtime: n/c - nighttime:  n/c Lowest sugar was 125 >> 100 >> 79 >> 98; it is unclear at which level she has hypoglycemia awareness. Highest sugar was 220 >> 207 >> 164 >> 220.  Glucometer:  One Touch Ultra  She is seen in the [facility]. She eats low carb. + Juice.  -No CKD, last BUN/creatinine:  Lab Results  Component Value Date   BUN 21 11/02/2019   BUN 19 12/21/2018   CREATININE 0.86 11/02/2019   CREATININE 0.97 12/21/2018   -+ HL; last set of lipids: Lab Results  Component Value Date   CHOL 234 (H) 11/02/2019   HDL 63 11/02/2019   LDLCALC 154 (H) 11/02/2019   LDLDIRECT 156.0 06/13/2018   TRIG 95 11/02/2019   CHOLHDL 5 09/23/2018  She did not start pravastatin as suggested  - last eye exam was on 10/25/2019: No DR  - no numbness and tingling in her feet.  Pt has FH of DM in mother (?)  -She is adopted.  ROS: Constitutional: no weight gain/no weight loss, no fatigue, no subjective hyperthermia, no subjective hypothermia Eyes: no blurry vision, no xerophthalmia ENT: no sore throat, no nodules palpated in neck, no dysphagia, no odynophagia, no hoarseness Cardiovascular: no CP/no SOB/no palpitations/no leg swelling Respiratory: no cough/no SOB/no wheezing Gastrointestinal: no N/no V/no D/no C/no acid reflux Musculoskeletal: no muscle aches/no joint aches Skin: no rashes, no hair loss, + pain in perineal area Neurological: no tremors/no numbness/no tingling/no dizziness  I reviewed  pt's medications, allergies, PMH, social hx, family hx, and changes were documented in the history of present illness. Otherwise, unchanged from my initial visit note.  Past Medical History:  Diagnosis Date  . Anemia   . Fatty liver   . History of blood transfusion    "when I had tonsils taken out & w/hyster"  . Hypercholesterolemia   . Hypertension   . Menometrorrhagia   . Microcytic anemia   . Type II diabetes mellitus ([place])   . Vitamin D deficiency    Past Surgical History:  Procedure  Laterality Date  . ABDOMINAL HYSTERECTOMY  ~ 2012  . CESAREAN SECTION  1999; 2004; 2006  . HIP PINNING Bilateral ~ 1991   "balls had dropped out of their sockets"  . TONSILLECTOMY AND ADENOIDECTOMY    . TUBAL LIGATION  2006   Social History   Social History  . Marital status: Single    Spouse name: N/A  . Number of children: 2    Occupational History  . Lab tech   Social History Main Topics  . Smoking status: Former Smoker    Packs/day: 0.12 - 2 cigs a day    Years: 4.00    Types: Cigarettes    Quit date: 07/10/2014  . Smokeless tobacco: Never Used  . Alcohol use Yes     Comment: 11/22/2014 "might drink a little a couple times/month, if that"  . Drug use: No  . Sexual activity: Not Currently    Birth control/ protection: Surgical   Current Outpatient Medications on File Prior to Visit  Medication Sig Dispense Refill  . Accu-Chek Softclix Lancets lancets USE AS DIRECTED 100 each 0  . blood glucose meter kit and supplies KIT Dispense based on patient and insurance preference.  ICD-9 250.00, 250.01. 1 each 0  . Blood Pressure Monitoring (BLOOD PRESSURE MONITOR/L CUFF) MISC Check your blood pressure at the same time each day. 1 each 0  . buPROPion (WELLBUTRIN SR) 150 MG 12 hr tablet Take 1 tablet (150 mg total) by mouth daily. 30 tablet 0  . chlorthalidone (HYGROTON) 50 MG tablet TAKE 1 TABLET BY MOUTH ONCE DAILY 90 tablet 0  . glucose blood (ACCU-CHEK AVIVA PLUS) test strip Use to check blood sugar twice a day. 100 each 11  . hydrOXYzine (ATARAX/VISTARIL) 50 MG tablet Take 1 tablet (50 mg total) by mouth 3 (three) times daily as needed for anxiety. 60 tablet 3  . insulin degludec (TRESIBA FLEXTOUCH) 200 UNIT/ML FlexTouch Pen Inject 28 Units into the skin daily. 4.2 mL 0  . Insulin Pen Needle (BD PEN NEEDLE NANO U/F) 32G X 4 MM MISC Use one needle twice a day 100 each 3  . metFORMIN (GLUCOPHAGE-XR) 500 MG 24 hr tablet TAKE 2 TABLETS BY MOUTH 2 TIMES DAILY WITH A MEAL. 360 tablet 1   . naproxen (NAPROSYN) 500 MG tablet Take 1 tablet (500 mg total) by mouth 2 (two) times daily. 30 tablet 0  . potassium chloride SA (K-DUR) 20 MEQ tablet Take 1 tablet (20 mEq total) by mouth daily. 60 tablet 5  . rizatriptan (MAXALT) 5 MG tablet Take 1 tablet (5 mg total) by mouth as needed for migraine. May repeat in 2 hours if needed 10 tablet 0  . Vitamin D, Ergocalciferol, (DRISDOL) 1.25 MG (50000 UNIT) CAPS capsule Take 1 capsule (50,000 Units total) by mouth every 7 (seven) days. 4 capsule 0  . [DISCONTINUED] ferrous fumarate (HEMOCYTE - 106 MG FE) 325 (106 FE) MG TABS Take  1 tablet by mouth daily.      . [DISCONTINUED] medroxyPROGESTERone (PROVERA) 10 MG tablet Take 10 mg by mouth daily.       No current facility-administered medications on file prior to visit.   Allergies  Allergen Reactions  . Latex Rash   Family History  Adopted: Yes  Problem Relation Age of Onset  . Hypertension Daughter     PE: BP 132/84   Pulse 64   Ht '5\' 6"'  (1.676 m)   Wt 288 lb (130.6 kg)   LMP 01/05/2011 (Exact Date)   SpO2 99%   BMI 46.48 kg/m  Wt Readings from Last 3 Encounters:  04/30/20 288 lb (130.6 kg)  02/28/20 286 lb (129.7 kg)  02/01/20 282 lb (127.9 kg)   Constitutional: overweight, in NAD Eyes: PERRLA, EOMI, no exophthalmos ENT: moist mucous membranes, no thyromegaly, no cervical lymphadenopathy Cardiovascular: RRR, No MRG Respiratory: CTA B Gastrointestinal: abdomen soft, NT, ND, BS+ Musculoskeletal: no deformities, strength intact in all 4 Skin: moist, warm, no rashes Neurological: no tremor with outstretched hands, DTR normal in all 4  ASSESSMENT: 1. DM2, insulin-dependent, uncontrolled, without long term complications, but with hyperglycemia  2. Obesity BMI Classification:  < 18.5 underweight   18.5-24.9 normal weight   25.0-29.9 overweight   30.0-34.9 class I obesity   35.0-39.9 class II obesity   ? 40.0 class III obesity   3. HL  PLAN:  1. Patient  with history of uncontrolled diabetes, on oral antidiabetic regimen with Metformin and also basal insulin, returning after long absence of the year and 4 months.  I am limited in the medication choices for her diabetes due to her history of frequent urination from chlorthalidone and also pancreatitis while on Victoza, so the SGLT2 inhibitors and GLP-1 receptor agonists are not safe options for her.  We discussed in the past about healthy changes in her diet and also starting exercise and she started to do so before last visit. -At last visit, sugars are much better as she was taking her medications consistently and she was very careful with her diet and also walking at work.   -We reviewed together her most recent HbA1c, and this was 7.3%, much improved -Also, it appears that patient had another HbA1c performed at the beginning of this month at [facility], and this was 6.7%.  This was reviewed after the we obtained another HbA1c today: 7.8% (higher) -Reviewing the blood sugars from home, they appear better than expected from the HbA1c.  They are at goal at almost all times of the day, although she mentions that they are higher recently due to her abscess and being on antibiotics.  She is also drinking juice, which I advised her to stop.   -We will also check a fructosamine level today. - I suggested to:  Patient Instructions  Please continue: - Metformin ER 1000 mg 2x a day with meals - Tresiba 28 units at bedtime  Please stop juice.  Please stop at the lab.  Please return in 4 months with your sugar log.   - advised to check sugars at different times of the day - 1x a day, rotating check times - advised for yearly eye exams >> she is UTD - return to clinic in 4 months   2. Obesity class 3 -Unfortunately, we cannot use a GLP-1 receptor agonist as she developed pancreatitis while on Victoza.  She also had frequent urination from chlorthalidone so we could not use SGLT2 inhibitors.  Both of  these medication classes would have helped with weight loss. -At last visit, she did increase exercise and was watching her diet. -Since then, however, she gained approximately 20 pounds. -She started to go to the [facility] and working closely with Dr. Leafy Ro -We discussed at the next visit we may reconsider SGLT2 inhibitors, but definitely not now in the setting of an active perineal infection.  3. HL -Reviewed latest lipid panel from 10/2019: LDL remains high, above target, the rest of the fractions at goal Lab Results  Component Value Date   CHOL 234 (H) 11/02/2019   HDL 63 11/02/2019   LDLCALC 154 (H) 11/02/2019   LDLDIRECT 156.0 06/13/2018   TRIG 95 11/02/2019   CHOLHDL 5 09/23/2018  -I suggested pravastatin at the previous visit  Component     Latest Ref Rng & Units 04/30/2020  Hemoglobin A1C     4.0 - 5.6 % 7.8 (A)  Fructosamine     205 - 285 umol/L 293 (H)  HbA1c calculated from fructosamine is 6.6%, slightly better than before.  Philemon Kingdom, MD PhD [facility]

## 2020-04-30 NOTE — Patient Instructions (Addendum)
Please continue: - Metformin ER 1000 mg 2x a day with meals - Tresiba 28 units at bedtime  Please stop juice.  Please stop at the lab.  Please return in 4 months with your sugar log.

## 2020-05-03 LAB — FRUCTOSAMINE: Fructosamine: 293 umol/L — ABNORMAL HIGH (ref 205–285)

## 2020-05-08 ENCOUNTER — Other Ambulatory Visit: Payer: Self-pay | Admitting: Internal Medicine

## 2020-07-16 ENCOUNTER — Encounter: Payer: Self-pay | Admitting: Family Medicine

## 2020-07-18 ENCOUNTER — Encounter: Payer: Self-pay | Admitting: Family Medicine

## 2020-07-18 ENCOUNTER — Other Ambulatory Visit: Payer: Self-pay

## 2020-07-18 MED ORDER — CARVEDILOL 6.25 MG PO TABS: 6.2500 mg | ORAL_TABLET | Freq: Two times a day (BID) | ORAL | 3 refills | Status: DC

## 2020-07-22 ENCOUNTER — Other Ambulatory Visit: Payer: Self-pay | Admitting: Family Medicine

## 2020-07-22 NOTE — Progress Notes (Signed)
Error. Disregard

## 2020-08-29 ENCOUNTER — Other Ambulatory Visit: Payer: Self-pay | Admitting: Family Medicine

## 2020-08-29 DIAGNOSIS — G43009 Migraine without aura, not intractable, without status migrainosus: Secondary | ICD-10-CM

## 2020-08-30 ENCOUNTER — Ambulatory Visit: Payer: No Typology Code available for payment source | Admitting: Internal Medicine

## 2020-08-30 DIAGNOSIS — I10 Essential (primary) hypertension: Secondary | ICD-10-CM | POA: Diagnosis not present

## 2020-08-30 DIAGNOSIS — R519 Headache, unspecified: Secondary | ICD-10-CM | POA: Diagnosis not present

## 2020-08-30 NOTE — Telephone Encounter (Signed)
Left pt a message to call the office for f/u appointment on refill

## 2020-09-04 ENCOUNTER — Telehealth (INDEPENDENT_AMBULATORY_CARE_PROVIDER_SITE_OTHER): Payer: Medicaid Other | Admitting: Family Medicine

## 2020-09-04 ENCOUNTER — Encounter: Payer: Self-pay | Admitting: Family Medicine

## 2020-09-04 DIAGNOSIS — J018 Other acute sinusitis: Secondary | ICD-10-CM | POA: Diagnosis not present

## 2020-09-04 DIAGNOSIS — R519 Headache, unspecified: Secondary | ICD-10-CM

## 2020-09-04 MED ORDER — AMOXICILLIN 500 MG PO TABS: 500.0000 mg | ORAL_TABLET | Freq: Two times a day (BID) | ORAL | 0 refills | Status: AC

## 2020-09-04 NOTE — Progress Notes (Signed)
Virtual Visit via Video Note  I connected with Rachel Dawson on 09/04/20 at 10:30 AM EST by a video enabled telemedicine application 2/2 [id number] pandemic and verified that I am speaking with the correct person using two identifiers.  Location patient: home Location provider:work or home office Persons participating in the virtual visit: patient, provider  I discussed the limitations of evaluation and management by telemedicine and the availability of in person appointments. The patient expressed understanding and agreed to proceed.   HPI: Pt is a 40 year old female with past medical history significant for HTN, DM 2, obesity, anemia, fibroid, fatty liver, migraines, HLD, anxiety and depression who is being seen for acute concern.  Pt had COVID after Christmas into the new yr.  She now hasand ongoing HA x 10 days.  Pt unsure if HA was 2/2 her migraines.  A warm wash cloth across her face helps the discomfort for about 1 hr.  Pain across bridge of nose and R frontal area.  Pressure in L maxillary area.  Pt using ibuprofen and saline nasal rinse frequently.  Pt also with sneezing.  Denies nasal congestion, sore throat, cough, fever.  Seeing Endo for DM, staes fsbs has been good.  Was 150 last night.  ROS: See pertinent positives and negatives per HPI.  Past Medical History:  Diagnosis Date  . Anemia   . Fatty liver   . History of blood transfusion    "when I had tonsils taken out & w/hyster"  . Hypercholesterolemia   . Hypertension   . Menometrorrhagia   . Microcytic anemia   . Type II diabetes mellitus ([place])   . Vitamin D deficiency     Past Surgical History:  Procedure Laterality Date  . ABDOMINAL HYSTERECTOMY  ~ 2012  . CESAREAN SECTION  1999; 2004; 2006  . HIP PINNING Bilateral ~ 1991   "balls had dropped out of their sockets"  . TONSILLECTOMY AND ADENOIDECTOMY    . TUBAL LIGATION  2006    Family History  Adopted: Yes  Problem Relation Age of Onset  . Hypertension  Daughter     Current Outpatient Medications:  .  Accu-Chek Softclix Lancets lancets, USE AS DIRECTED, Disp: 100 each, Rfl: 0 .  blood glucose meter kit and supplies KIT, Dispense based on patient and insurance preference.  ICD-9 250.00, 250.01., Disp: 1 each, Rfl: 0 .  Blood Pressure Monitoring (BLOOD PRESSURE MONITOR/L CUFF) MISC, Check your blood pressure at the same time each day., Disp: 1 each, Rfl: 0 .  buPROPion (WELLBUTRIN SR) 150 MG 12 hr tablet, Take 1 tablet (150 mg total) by mouth daily., Disp: 30 tablet, Rfl: 0 .  carvedilol (COREG) 6.25 MG tablet, Take 1 tablet (6.25 mg total) by mouth 2 (two) times daily with a meal., Disp: 60 tablet, Rfl: 3 .  glucose blood (ACCU-CHEK AVIVA PLUS) test strip, Use to check blood sugar twice a day., Disp: 100 each, Rfl: 11 .  hydrOXYzine (ATARAX/VISTARIL) 50 MG tablet, Take 1 tablet (50 mg total) by mouth 3 (three) times daily as needed for anxiety., Disp: 60 tablet, Rfl: 3 .  insulin degludec (TRESIBA FLEXTOUCH) 200 UNIT/ML FlexTouch Pen, Inject 28 Units into the skin daily., Disp: 9 mL, Rfl: 3 .  Insulin Pen Needle (BD PEN NEEDLE NANO U/F) 32G X 4 MM MISC, Use one needle twice a day, Disp: 100 each, Rfl: 3 .  metFORMIN (GLUCOPHAGE-XR) 500 MG 24 hr tablet, TAKE 2 TABLETS BY MOUTH 2 TIMES DAILY WITH A MEAL., Disp:  360 tablet, Rfl: 1 .  naproxen (NAPROSYN) 500 MG tablet, Take 1 tablet (500 mg total) by mouth 2 (two) times daily., Disp: 30 tablet, Rfl: 0 .  rizatriptan (MAXALT) 5 MG tablet, Take 1 tablet (5 mg total) by mouth as needed for migraine. May repeat in 2 hours if needed, Disp: 10 tablet, Rfl: 0  EXAM:  VITALS per patient if applicable: RR between 33-17 bpm  GENERAL: alert, oriented, appears well and in no acute distress  HEENT: atraumatic, conjunctiva clear, no obvious abnormalities on inspection of external nose and ears  NECK: normal movements of the head and neck  LUNGS: on inspection no signs of respiratory distress, breathing rate  appears normal, no obvious gross SOB, gasping or wheezing  CV: no obvious cyanosis  MS: moves all visible extremities without noticeable abnormality  PSYCH/NEURO: pleasant and cooperative, no obvious depression or anxiety, speech and thought processing grossly intact  ASSESSMENT AND PLAN:  Discussed the following assessment and plan:  Other subacute sinusitis  -discussed supportive care -History of retention cyst in left maxillary antrum seen on CT 08/05/2018 -We will start amoxicillin -Given precautions - Plan: amoxicillin (AMOXIL) 500 MG tablet  Acute nonintractable headache, unspecified headache type -Discussed supportive care including hydration, getting plenty of rest, limiting NSAID use -Discussed possibility of rebound headaches 2/2 frequent ibuprofen use -Okay to use Tylenol if needed -HA likely 2/2 sinus issues  F/u prn   I discussed the assessment and treatment plan with the patient. The patient was provided an opportunity to ask questions and all were answered. The patient agreed with the plan and demonstrated an understanding of the instructions.   The patient was advised to call back or seek an in-person evaluation if the symptoms worsen or if the condition fails to improve as anticipated.   Billie Ruddy, MD

## 2020-10-26 ENCOUNTER — Encounter: Payer: Self-pay | Admitting: Internal Medicine

## 2020-10-26 ENCOUNTER — Other Ambulatory Visit: Payer: Self-pay | Admitting: Internal Medicine

## 2020-10-26 DIAGNOSIS — Z794 Long term (current) use of insulin: Secondary | ICD-10-CM

## 2020-10-26 DIAGNOSIS — E1165 Type 2 diabetes mellitus with hyperglycemia: Secondary | ICD-10-CM

## 2020-10-26 MED ORDER — TRESIBA FLEXTOUCH 200 UNIT/ML ~~LOC~~ SOPN: 28.0000 [IU] | PEN_INJECTOR | Freq: Every day | SUBCUTANEOUS | 0 refills | Status: DC

## 2020-10-30 ENCOUNTER — Other Ambulatory Visit: Payer: Self-pay | Admitting: Family Medicine

## 2020-11-09 ENCOUNTER — Other Ambulatory Visit (HOSPITAL_COMMUNITY): Payer: Self-pay

## 2020-11-09 MED FILL — Insulin Degludec Soln Pen-Injector 200 Unit/ML: SUBCUTANEOUS | 21 days supply | Qty: 6 | Fill #0 | Status: CN

## 2020-11-13 ENCOUNTER — Other Ambulatory Visit: Payer: Self-pay | Admitting: Internal Medicine

## 2020-11-21 ENCOUNTER — Encounter: Payer: Self-pay | Admitting: Family Medicine

## 2020-11-22 ENCOUNTER — Other Ambulatory Visit (HOSPITAL_COMMUNITY): Payer: Self-pay

## 2020-11-26 ENCOUNTER — Other Ambulatory Visit: Payer: Self-pay | Admitting: Family Medicine

## 2020-12-02 ENCOUNTER — Ambulatory Visit: Payer: No Typology Code available for payment source | Admitting: Family Medicine

## 2020-12-09 ENCOUNTER — Ambulatory Visit (INDEPENDENT_AMBULATORY_CARE_PROVIDER_SITE_OTHER): Payer: Medicaid Other | Admitting: Family Medicine

## 2020-12-09 ENCOUNTER — Other Ambulatory Visit: Payer: Self-pay

## 2020-12-09 ENCOUNTER — Encounter: Payer: Self-pay | Admitting: Family Medicine

## 2020-12-09 VITALS — BP 150/90 | HR 65 | Temp 98.5°F | Ht 66.5 in | Wt 304.6 lb

## 2020-12-09 DIAGNOSIS — R635 Abnormal weight gain: Secondary | ICD-10-CM

## 2020-12-09 DIAGNOSIS — E782 Mixed hyperlipidemia: Secondary | ICD-10-CM

## 2020-12-09 DIAGNOSIS — Z0000 Encounter for general adult medical examination without abnormal findings: Secondary | ICD-10-CM | POA: Diagnosis not present

## 2020-12-09 DIAGNOSIS — E049 Nontoxic goiter, unspecified: Secondary | ICD-10-CM | POA: Diagnosis not present

## 2020-12-09 DIAGNOSIS — E1169 Type 2 diabetes mellitus with other specified complication: Secondary | ICD-10-CM | POA: Diagnosis not present

## 2020-12-09 DIAGNOSIS — I10 Essential (primary) hypertension: Secondary | ICD-10-CM | POA: Diagnosis not present

## 2020-12-09 LAB — MICROALBUMIN / CREATININE URINE RATIO
Creatinine,U: 87.6 mg/dL
Microalb Creat Ratio: 1.2 mg/g (ref 0.0–30.0)
Microalb, Ur: 1.1 mg/dL (ref 0.0–1.9)

## 2020-12-09 LAB — LIPID PANEL
Cholesterol: 189 mg/dL (ref 0–200)
HDL: 49.1 mg/dL (ref >39.00)
LDL Cholesterol: 124 mg/dL — ABNORMAL HIGH (ref 0–99)
NonHDL: 140.27
Total CHOL/HDL Ratio: 4
Triglycerides: 82 mg/dL (ref 0.0–149.0)
VLDL: 16.4 mg/dL (ref 0.0–40.0)

## 2020-12-09 LAB — TSH: TSH: 1.4 u[IU]/mL (ref 0.35–4.50)

## 2020-12-09 LAB — VITAMIN D 25 HYDROXY (VIT D DEFICIENCY, FRACTURES): VITD: 17.15 ng/mL — ABNORMAL LOW (ref 30.00–100.00)

## 2020-12-09 LAB — T4, FREE: Free T4: 0.88 ng/dL (ref 0.60–1.60)

## 2020-12-09 LAB — HEMOGLOBIN A1C: Hgb A1c MFr Bld: 6.8 % — ABNORMAL HIGH (ref 4.6–6.5)

## 2020-12-09 MED ORDER — IRBESARTAN 75 MG PO TABS: 75.0000 mg | ORAL_TABLET | Freq: Every day | ORAL | 3 refills | Status: DC

## 2020-12-09 NOTE — Progress Notes (Signed)
Subjective:     Rachel Dawson is a 40 y.o. female and is here for a comprehensive physical exam. The patient reports BP elevated on Coreg 6.25 mg twice daily.  Previously well controlled on chlorthalidone, but switched during hospitalization last year.  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  Pt has a microwave in the hotel room. Eating more frozen meals or at restaurants.  States bs has been good.  Followed by endocrinology.  Eating more fruit.  Unable to exercise 2/2 work schedule.  Notes weight gain.  Followed by OB/Gyn.  S/p hysterectomy.  Last pelvic exam was last yr.   Social History   Socioeconomic History  . Marital status: Single    Spouse name: Not on file  . Number of children: 3  . Years of education: Not on file  . Highest education level: Associate degree: academic program  Occupational History  . Occupation: Med Engineer, production: [employer]  Tobacco Use  . Smoking status: Former Smoker    Packs/day: 0.12    Years: 4.00    Pack years: 0.48    Types: Cigarettes    Quit date: 07/10/2014    Years since quitting: 6.4  . Smokeless tobacco: Never Used  Vaping Use  . Vaping Use: Never used  Substance and Sexual Activity  . Alcohol use: Yes    Comment: occ  . Drug use: No  . Sexual activity: Not Currently    Birth control/protection: Surgical  Other Topics Concern  . Not on file  Social History Narrative   Patient is right-handed. She is single. She lives in a single level home. She rarely drinks caffeine. She walks daily, and has recently began aeorbic exercises for 30 minutes daily.   Social Determinants of Health   Financial Resource Strain: Not on file  Food Insecurity: Not on file  Transportation Needs: Not on file  Physical Activity: Not on file  Stress: Not on file  Social Connections: Not on file  Intimate Partner  Violence: Not on file   Health Maintenance  Topic Date Due  . PNEUMOCOCCAL POLYSACCHARIDE VACCINE AGE 69-64 HIGH RISK  Never done  . FOOT EXAM  Never done  . URINE MICROALBUMIN  Never done  . PAP SMEAR-Modifier  12/09/2018  . OPHTHALMOLOGY EXAM  10/24/2020  . HEMOGLOBIN A1C  10/28/2020  . INFLUENZA VACCINE  03/10/2021  . TETANUS/TDAP  07/01/2030  . COVID-19 Vaccine  Completed  . Hepatitis C Screening  Completed  . HIV Screening  Completed  . HPV VACCINES  Aged Out    The following portions of the patient's history were reviewed and updated as appropriate: allergies, current medications, past family history, past medical history, past social history, past surgical history and problem list.  Review of Systems Pertinent items noted in HPI and remainder of comprehensive ROS otherwise negative.   Objective:    BP (!) 150/90 (BP Location: Right Arm, Patient Position: Sitting, Cuff Size: Normal)   Pulse 65   Temp 98.5 F (36.9 C) (Oral)   Ht 5' 6.5" (1.689 m)   Wt (!) 304 lb 9.6 oz (138.2 kg)   LMP 01/05/2011 (Exact Date)   SpO2 97%   BMI 48.43 kg/m  General appearance: alert, cooperative and no distress Head: Normocephalic, without obvious abnormality, atraumatic Eyes: conjunctivae/corneas clear. PERRL, EOM's intact. Fundi benign. Ears: normal  TM's and external ear canals both ears Nose: Nares normal. Septum midline. Mucosa normal. No drainage or sinus tenderness. Throat: lips, mucosa, and tongue normal; teeth and gums normal Neck: no adenopathy, no carotid bruit, no JVD, supple, symmetrical, trachea midline and Thyroid enlarged R lobe> L lobe, no nodules noted. Lungs: clear to auscultation bilaterally Heart: regular rate and rhythm, S1, S2 normal, no murmur, click, rub or gallop Abdomen: soft, non-tender; bowel sounds normal; no masses,  no organomegaly Extremities: extremities normal, atraumatic, no cyanosis or edema Pulses: 2+ and symmetric Skin: warm, dry, intact.   Acanthosis nigricans on posterior neck.   Lymph nodes: Cervical, supraclavicular, and axillary nodes normal. Neurologic: Alert and oriented X 3, normal strength and tone. Normal symmetric reflexes. Normal coordination and gait    Assessment:    Healthy female exam.      Plan:   Well adult exam -Anticipatory guidance given including wearing seatbelts, smoke detectors in the home, increasing physical activity, increasing p.o. intake of water and vegetables. -will obtain labs.  CBC and CMP done 11/24/20: WBC 5.3, hemoglobin 11.4, HCT 35.7, MCV 84.8, PLT 318, sodium 138, potassium 4.0, glucose 94, creatinine 0.88, calcium 9.1, albumin 4.2, ALT 11, AST 9, alk phos 63, LDH 124, creatinine kinase 121 Phos 3.1, mag 1.7, ESR 20. -Pap not indicated 2/2 history of hysterectomy.  Followed by OB/GYN -Colonoscopy not yet indicated -Given handout -See After Visit Summary for Counseling Recommendations  Type 2 diabetes mellitus with other specified complication, without long-term current use of insulin ([place]) -Controlled -h/o pancreatitis -Discussed the importance of lifestyle modifications.  Patient to try and meal prep, though difficult working third shift -Continue current medications including metformin 1000 mg twice daily Tresiba 28 units daily -Diabetic retinopathy screen negative on 10/25/2019.  Patient plans on scheduling appointment for follow-up -Foot exam done this visit -We will obtain microalbumin creatinine ratio -Continue follow-up with endocrinology - Plan: Hemoglobin A1c, Microalbumin/Creatinine Ratio, Urine  Mixed hyperlipidemia -Lifestyle modifications - Plan: Lipid panel  Essential hypertension -Previously controlled on chlorthalidone now elevated on Coreg 6.25 mg. -Weight gain and working third shift likely contributing -Discussed lifestyle modifications -We will hold Coreg 6.25 mg twice daily.  Hesitant to increase Coreg 2/2 pulse -We will start irbesartan 75 mg  daily. -Patient encouraged to check BP at home and keep a log to bring with her to clinic - Plan: irbesartan (AVAPRO) 75 MG tablet  Goiter - Plan: TSH, T4, Free  Weight gain -Discussed the importance of lifestyle modifications - Plan: TSH, T4, Free, Vitamin D, 25-hydroxy   Follow-up in 1 month, sooner if needed  Grier Mitts, MD

## 2020-12-09 NOTE — Patient Instructions (Addendum)
Preventive Care 42-42 Years Old, Female Preventive care refers to lifestyle choices and visits with your health care provider that can promote health and wellness. This includes:  A yearly physical exam. This is also called an annual wellness visit.  Regular dental and eye exams.  Immunizations.  Screening for certain conditions.  Healthy lifestyle choices, such as: ? Eating a healthy diet. ? Getting regular exercise. ? Not using drugs or products that contain nicotine and tobacco. ? Limiting alcohol use. What can I expect for my preventive care visit? Physical exam Your health care provider will check your:  Height and weight. These may be used to calculate your BMI (body mass index). BMI is a measurement that tells if you are at a healthy weight.  Heart rate and blood pressure.  Body temperature.  Skin for abnormal spots. Counseling Your health care provider may ask you questions about your:  Past medical problems.  Family's medical history.  Alcohol, tobacco, and drug use.  Emotional well-being.  Home life and relationship well-being.  Sexual activity.  Diet, exercise, and sleep habits.  Work and work Statistician.  Access to firearms.  Method of birth control.  Menstrual cycle.  Pregnancy history. What immunizations do I need? Vaccines are usually given at various ages, according to a schedule. Your health care provider will recommend vaccines for you based on your age, medical history, and lifestyle or other factors, such as travel or where you work.   What tests do I need? Blood tests  Lipid and cholesterol levels. These may be checked every 5 years, or more often if you are over 42 years old.  Hepatitis C test.  Hepatitis B test. Screening  Lung cancer screening. You may have this screening every year starting at age 42 if you have a 30-pack-year history of smoking and currently smoke or have quit within the past 15 years.  Colorectal cancer  screening. ? All adults should have this screening starting at age 42 and continuing until age 17. ? Your health care provider may recommend screening at age 42 if you are at increased risk. ? You will have tests every 1-10 years, depending on your results and the type of screening test.  Diabetes screening. ? This is done by checking your blood sugar (glucose) after you have not eaten for a while (fasting). ? You may have this done every 1-3 years.  Mammogram. ? This may be done every 1-2 years. ? Talk with your health care provider about when you should start having regular mammograms. This may depend on whether you have a family history of breast cancer.  BRCA-related cancer screening. This may be done if you have a family history of breast, ovarian, tubal, or peritoneal cancers.  Pelvic exam and Pap test. ? This may be done every 3 years starting at age 42. ? Starting at age 11, this may be done every 5 years if you have a Pap test in combination with an HPV test. Other tests  STD (sexually transmitted disease) testing, if you are at risk.  Bone density scan. This is done to screen for osteoporosis. You may have this scan if you are at high risk for osteoporosis. Talk with your health care provider about your test results, treatment options, and if necessary, the need for more tests. Follow these instructions at home: Eating and drinking  Eat a diet that includes fresh fruits and vegetables, whole grains, lean protein, and low-fat dairy products.  Take vitamin and mineral supplements  as recommended by your health care provider.  Do not drink alcohol if: ? Your health care provider tells you not to drink. ? You are pregnant, may be pregnant, or are planning to become pregnant.  If you drink alcohol: ? Limit how much you have to 0-1 drink a day. ? Be aware of how much alcohol is in your drink. In the [place] one drink equals one 12 oz bottle of beer (355 mL), one 5 oz glass of  wine (148 mL), or one 1 oz glass of hard liquor (44 mL).   Lifestyle  Take daily care of your teeth and gums. Brush your teeth every morning and night with fluoride toothpaste. Floss one time each day.  Stay active. Exercise for at least 30 minutes 5 or more days each week.  Do not use any products that contain nicotine or tobacco, such as cigarettes, e-cigarettes, and chewing tobacco. If you need help quitting, ask your health care provider.  Do not use drugs.  If you are sexually active, practice safe sex. Use a condom or other form of protection to prevent STIs (sexually transmitted infections).  If you do not wish to become pregnant, use a form of birth control. If you plan to become pregnant, see your health care provider for a prepregnancy visit.  If told by your health care provider, take low-dose aspirin daily starting at age 42.  Find healthy ways to cope with stress, such as: ? Meditation, yoga, or listening to music. ? Journaling. ? Talking to a trusted person. ? Spending time with friends and family. Safety  Always wear your seat belt while driving or riding in a vehicle.  Do not drive: ? If you have been drinking alcohol. Do not ride with someone who has been drinking. ? When you are tired or distracted. ? While texting.  Wear a helmet and other protective equipment during sports activities.  If you have firearms in your house, make sure you follow all gun safety procedures. What's next?  Visit your health care provider once a year for an annual wellness visit.  Ask your health care provider how often you should have your eyes and teeth checked.  Stay up to date on all vaccines. This information is not intended to replace advice given to you by your health care provider. Make sure you discuss any questions you have with your health care provider. Document Revised: 04/30/2020 Document Reviewed: 04/07/2018 Elsevier Patient Education  2021 [place]  A goiter is an enlarged thyroid gland. The thyroid is located in the lower front of the neck. It makes hormones that affect many body parts and systems, including the system that affects how quickly the body burns fuel for energy (metabolism). Most goiters are painless and are not a cause for concern. Some goiters can affect the way your thyroid makes thyroid hormones. Goiters and conditions that cause goiters can be treated, if necessary. What are the causes? Common causes of this condition include:  Lack (deficiency) of a mineral called iodine. The thyroid gland uses iodine to make thyroid hormones.  Diseases that attack healthy cells in the body (autoimmune diseases) and affect thyroid function, such as Graves' disease or Hashimoto's disease. These diseases may cause the body to produce too much thyroid hormone (hyperthyroidism) or too little of the hormone (hypothyroidism).  Conditions that cause inflammation of the thyroid (thyroiditis).  One or more small growths on the thyroid (nodular goiter). Other causes include:  Medical problems caused  by abnormal genes that are passed from parent to child (genetic defects).  Thyroid injury or infection.  Tumors that may or may not be cancerous.  Pregnancy.  Certain medicines.  Exposure to radiation. In some cases, the cause may not be known. What increases the risk? This condition is more likely to develop in:  People who do not get enough iodine in their diet.  People who have a family history of goiter.  Women.  People who are older than age 78.  People who smoke tobacco.  People who have had exposure to radiation. What are the signs or symptoms? The main symptom of this condition is swelling in the lower, front part of the neck. This swelling can range from a very small bump to a large lump. Other symptoms may include:  A tight feeling in the throat.  A hoarse  voice.  Coughing.  Wheezing.  Difficulty swallowing or breathing.  Bulging veins in the neck.  Dizziness. When a goiter is the result of an overactive thyroid (hyperthyroidism), symptoms may also include:  Nervousness or restlessness.  Inability to tolerate heat.  Unexplained weight loss.  Diarrhea.  Change in the texture of hair or skin.  Changes in heartbeat, such as skipped beats, extra beats, or a rapid heart rate.  Loss of menstruation.  Shaky hands.  Increased appetite.  Sleep problems. When a goiter is the result of an underactive thyroid (hypothyroidism), symptoms may also include:  Feeling like you have no energy (lethargy).  Inability to tolerate cold.  Weight gain that is not explained by a change in diet or exercise habits.  Dry skin.  Coarse hair.  Irregular menstrual periods.  Constipation.  Sadness or depression.  Fatigue. In some cases, there may not be any symptoms and the thyroid hormone levels may be normal.   How is this diagnosed? This condition may be diagnosed based on your symptoms, your medical history, and a physical exam. You may have tests, such as:  Blood tests to check thyroid function.  Imaging tests, such as: ? Ultrasound. ? CT scan. ? MRI. ? Thyroid scan.  Removal of a tissue sample (biopsy) of the goiter or any nodules. The sample will be tested to check for cancer. How is this treated? Treatment for this condition depends on the cause and your symptoms. Treatment may include:  Medicines to regulate thyroid hormone levels.  Anti-inflammatory medicines or steroid medicines, if the goiter is caused by inflammation.  Iodine supplements or changes to your diet, if the goiter is caused by iodine deficiency.  Radioactive iodine treatment.  Surgery to remove your thyroid. In some cases, you may only need regular check-ups with your health care provider to monitor your condition, and you may not need  treatment. Follow these instructions at home:  Follow instructions from your health care provider about any changes to your diet.  Take over-the-counter and prescription medicines only as told by your health care provider. These include supplements.  Do not use any products that contain nicotine or tobacco, such as cigarettes and e-cigarettes. If you need help quitting, ask your health care provider.  Keep all follow-up visits as told by your health care provider. This is important. Contact a health care provider if:  Your symptoms do not get better with treatment.  You have nausea, vomiting, or diarrhea. Get help right away if:  You have sudden, unexplained confusion or other mental changes.  You have a fever.  You have chest pain.  You have  trouble breathing or swallowing.  You suddenly become very weak.  You experience extreme restlessness.  You feel your heart racing. Summary  A goiter is an enlarged thyroid gland.  The thyroid gland is located in the lower front of the neck. It makes hormones that affect many body parts and systems, including the system that affects how quickly the body burns fuel for energy (metabolism).  The main symptom of this condition is swelling in the lower, front part of the neck. This swelling can range from a very small bump to a large lump.  Treatment for this condition depends on the cause and your symptoms. You may need medicines, supplements, or regular monitoring of your condition. This information is not intended to replace advice given to you by your health care provider. Make sure you discuss any questions you have with your health care provider. Document Revised: 04/04/2020 Document Reviewed: 04/04/2020 Elsevier Patient Education  2021 [place] Your Hypertension Hypertension, also called high blood pressure, is when the force of the blood pressing against the walls of the arteries is too strong. Arteries are blood  vessels that carry blood from your heart throughout your body. Hypertension forces the heart to work harder to pump blood and may cause the arteries to become narrow or stiff. Understanding blood pressure readings Your personal target blood pressure may vary depending on your medical conditions, your age, and other factors. A blood pressure reading includes a higher number over a lower number. Ideally, your blood pressure should be below 120/80. You should know that:  The first, or top, number is called the systolic pressure. It is a measure of the pressure in your arteries as your heart beats.  The second, or bottom number, is called the diastolic pressure. It is a measure of the pressure in your arteries as the heart relaxes. Blood pressure is classified into four stages. Based on your blood pressure reading, your health care provider may use the following stages to determine what type of treatment you need, if any. Systolic pressure and diastolic pressure are measured in a unit called mmHg. Normal  Systolic pressure: below 427.  Diastolic pressure: below 80. Elevated  Systolic pressure: 062-376.  Diastolic pressure: below 80. Hypertension stage 1  Systolic pressure: 283-151.  Diastolic pressure: 76-16. Hypertension stage 2  Systolic pressure: 073 or above.  Diastolic pressure: 90 or above. How can this condition affect me? Managing your hypertension is an important responsibility. Over time, hypertension can damage the arteries and decrease blood flow to important parts of the body, including the brain, heart, and kidneys. Having untreated or uncontrolled hypertension can lead to:  A heart attack.  A stroke.  A weakened blood vessel (aneurysm).  Heart failure.  Kidney damage.  Eye damage.  Metabolic syndrome.  Memory and concentration problems.  Vascular dementia. What actions can I take to manage this condition? Hypertension can be managed by making lifestyle  changes and possibly by taking medicines. Your health care provider will help you make a plan to bring your blood pressure within a normal range. Nutrition  Eat a diet that is high in fiber and potassium, and low in salt (sodium), added sugar, and fat. An example eating plan is called the Dietary Approaches to Stop Hypertension (DASH) diet. To eat this way: ? Eat plenty of fresh fruits and vegetables. Try to fill one-half of your plate at each meal with fruits and vegetables. ? Eat whole grains, such as whole-wheat pasta, brown rice, or  whole-grain bread. Fill about one-fourth of your plate with whole grains. ? Eat low-fat dairy products. ? Avoid fatty cuts of meat, processed or cured meats, and poultry with skin. Fill about one-fourth of your plate with lean proteins such as fish, chicken without skin, beans, eggs, and tofu. ? Avoid pre-made and processed foods. These tend to be higher in sodium, added sugar, and fat.  Reduce your daily sodium intake. Most people with hypertension should eat less than 1,500 mg of sodium a day.   Lifestyle  Work with your health care provider to maintain a healthy body weight or to lose weight. Ask what an ideal weight is for you.  Get at least 30 minutes of exercise that causes your heart to beat faster (aerobic exercise) most days of the week. Activities may include walking, swimming, or biking.  Include exercise to strengthen your muscles (resistance exercise), such as weight lifting, as part of your weekly exercise routine. Try to do these types of exercises for 30 minutes at least 3 days a week.  Do not use any products that contain nicotine or tobacco, such as cigarettes, e-cigarettes, and chewing tobacco. If you need help quitting, ask your health care provider.  Control any long-term (chronic) conditions you have, such as high cholesterol or diabetes.  Identify your sources of stress and find ways to manage stress. This may include meditation, deep  breathing, or making time for fun activities.   Alcohol use  Do not drink alcohol if: ? Your health care provider tells you not to drink. ? You are pregnant, may be pregnant, or are planning to become pregnant.  If you drink alcohol: ? Limit how much you use to:  0-1 drink a day for women.  0-2 drinks a day for men. ? Be aware of how much alcohol is in your drink. In the [place] one drink equals one 12 oz bottle of beer (355 mL), one 5 oz glass of wine (148 mL), or one 1 oz glass of hard liquor (44 mL). Medicines Your health care provider may prescribe medicine if lifestyle changes are not enough to get your blood pressure under control and if:  Your systolic blood pressure is 130 or higher.  Your diastolic blood pressure is 80 or higher. Take medicines only as told by your health care provider. Follow the directions carefully. Blood pressure medicines must be taken as told by your health care provider. The medicine does not work as well when you skip doses. Skipping doses also puts you at risk for problems. Monitoring Before you monitor your blood pressure:  Do not smoke, drink caffeinated beverages, or exercise within 30 minutes before taking a measurement.  Use the bathroom and empty your bladder (urinate).  Sit quietly for at least 5 minutes before taking measurements. Monitor your blood pressure at home as told by your health care provider. To do this:  Sit with your back straight and supported.  Place your feet flat on the floor. Do not cross your legs.  Support your arm on a flat surface, such as a table. Make sure your upper arm is at heart level.  Each time you measure, take two or three readings one minute apart and record the results. You may also need to have your blood pressure checked regularly by your health care provider.   General information  Talk with your health care provider about your diet, exercise habits, and other lifestyle factors that may be  contributing to hypertension.  Review all  the medicines you take with your health care provider because there may be side effects or interactions.  Keep all visits as told by your health care provider. Your health care provider can help you create and adjust your plan for managing your high blood pressure. Where to find more information  National Heart, Lung, and Blood Institute: https://wilson-eaton.com/  American Heart Association: www.heart.org Contact a health care provider if:  You think you are having a reaction to medicines you have taken.  You have repeated (recurrent) headaches.  You feel dizzy.  You have swelling in your ankles.  You have trouble with your vision. Get help right away if:  You develop a severe headache or confusion.  You have unusual weakness or numbness, or you feel faint.  You have severe pain in your chest or abdomen.  You vomit repeatedly.  You have trouble breathing. These symptoms may represent a serious problem that is an emergency. Do not wait to see if the symptoms will go away. Get medical help right away. Call your local emergency services (911 in the [place] Do not drive yourself to the hospital. Summary  Hypertension is when the force of blood pumping through your arteries is too strong. If this condition is not controlled, it may put you at risk for serious complications.  Your personal target blood pressure may vary depending on your medical conditions, your age, and other factors. For most people, a normal blood pressure is less than 120/80.  Hypertension is managed by lifestyle changes, medicines, or both.  Lifestyle changes to help manage hypertension include losing weight, eating a healthy, low-sodium diet, exercising more, stopping smoking, and limiting alcohol. This information is not intended to replace advice given to you by your health care provider. Make sure you discuss any questions you have with your health care  provider. Document Revised: 09/01/2019 Document Reviewed: 06/27/2019 Elsevier Patient Education  2021 [place].  Diabetes Mellitus and Nutrition, Adult When you have diabetes, or diabetes mellitus, it is very important to have healthy eating habits because your blood sugar (glucose) levels are greatly affected by what you eat and drink. Eating healthy foods in the right amounts, at about the same times every day, can help you:  Control your blood glucose.  Lower your risk of heart disease.  Improve your blood pressure.  Reach or maintain a healthy weight. What can affect my meal plan? Every person with diabetes is different, and each person has different needs for a meal plan. Your health care provider may recommend that you work with a dietitian to make a meal plan that is best for you. Your meal plan may vary depending on factors such as:  The calories you need.  The medicines you take.  Your weight.  Your blood glucose, blood pressure, and cholesterol levels.  Your activity level.  Other health conditions you have, such as heart or kidney disease. How do carbohydrates affect me? Carbohydrates, also called carbs, affect your blood glucose level more than any other type of food. Eating carbs naturally raises the amount of glucose in your blood. Carb counting is a method for keeping track of how many carbs you eat. Counting carbs is important to keep your blood glucose at a healthy level, especially if you use insulin or take certain oral diabetes medicines. It is important to know how many carbs you can safely have in each meal. This is different for every person. Your dietitian can help you calculate how many carbs you should  have at each meal and for each snack. How does alcohol affect me? Alcohol can cause a sudden decrease in blood glucose (hypoglycemia), especially if you use insulin or take certain oral diabetes medicines. Hypoglycemia can be a life-threatening condition.  Symptoms of hypoglycemia, such as sleepiness, dizziness, and confusion, are similar to symptoms of having too much alcohol.  Do not drink alcohol if: ? Your health care provider tells you not to drink. ? You are pregnant, may be pregnant, or are planning to become pregnant.  If you drink alcohol: ? Do not drink on an empty stomach. ? Limit how much you use to:  0-1 drink a day for women.  0-2 drinks a day for men. ? Be aware of how much alcohol is in your drink. In the [place] one drink equals one 12 oz bottle of beer (355 mL), one 5 oz glass of wine (148 mL), or one 1 oz glass of hard liquor (44 mL). ? Keep yourself hydrated with water, diet soda, or unsweetened iced tea.  Keep in mind that regular soda, juice, and other mixers may contain a lot of sugar and must be counted as carbs. What are tips for following this plan? Reading food labels  Start by checking the serving size on the "Nutrition Facts" label of packaged foods and drinks. The amount of calories, carbs, fats, and other nutrients listed on the label is based on one serving of the item. Many items contain more than one serving per package.  Check the total grams (g) of carbs in one serving. You can calculate the number of servings of carbs in one serving by dividing the total carbs by 15. For example, if a food has 30 g of total carbs per serving, it would be equal to 2 servings of carbs.  Check the number of grams (g) of saturated fats and trans fats in one serving. Choose foods that have a low amount or none of these fats.  Check the number of milligrams (mg) of salt (sodium) in one serving. Most people should limit total sodium intake to less than 2,300 mg per day.  Always check the nutrition information of foods labeled as "low-fat" or "nonfat." These foods may be higher in added sugar or refined carbs and should be avoided.  Talk to your dietitian to identify your daily goals for nutrients listed on the  label. Shopping  Avoid buying canned, pre-made, or processed foods. These foods tend to be high in fat, sodium, and added sugar.  Shop around the outside edge of the grocery store. This is where you will most often find fresh fruits and vegetables, bulk grains, fresh meats, and fresh dairy. Cooking  Use low-heat cooking methods, such as baking, instead of high-heat cooking methods like deep frying.  Cook using healthy oils, such as olive, canola, or sunflower oil.  Avoid cooking with butter, cream, or high-fat meats. Meal planning  Eat meals and snacks regularly, preferably at the same times every day. Avoid going long periods of time without eating.  Eat foods that are high in fiber, such as fresh fruits, vegetables, beans, and whole grains. Talk with your dietitian about how many servings of carbs you can eat at each meal.  Eat 4-6 oz (112-168 g) of lean protein each day, such as lean meat, chicken, fish, eggs, or tofu. One ounce (oz) of lean protein is equal to: ? 1 oz (28 g) of meat, chicken, or fish. ? 1 egg. ?  cup (62 g)  of tofu.  Eat some foods each day that contain healthy fats, such as avocado, nuts, seeds, and fish.   What foods should I eat? Fruits Berries. Apples. Oranges. Peaches. Apricots. Plums. Grapes. Mango. Papaya. Pomegranate. Kiwi. Cherries. Vegetables Lettuce. Spinach. Leafy greens, including kale, chard, collard greens, and mustard greens. Beets. Cauliflower. Cabbage. Broccoli. Carrots. Green beans. Tomatoes. Peppers. Onions. Cucumbers. Brussels sprouts. Grains Whole grains, such as whole-wheat or whole-grain bread, crackers, tortillas, cereal, and pasta. Unsweetened oatmeal. Quinoa. Brown or wild rice. Meats and other proteins Seafood. Poultry without skin. Lean cuts of poultry and beef. Tofu. Nuts. Seeds. Dairy Low-fat or fat-free dairy products such as milk, yogurt, and cheese. The items listed above may not be a complete list of foods and beverages you  can eat. Contact a dietitian for more information. What foods should I avoid? Fruits Fruits canned with syrup. Vegetables Canned vegetables. Frozen vegetables with butter or cream sauce. Grains Refined white flour and flour products such as bread, pasta, snack foods, and cereals. Avoid all processed foods. Meats and other proteins Fatty cuts of meat. Poultry with skin. Breaded or fried meats. Processed meat. Avoid saturated fats. Dairy Full-fat yogurt, cheese, or milk. Beverages Sweetened drinks, such as soda or iced tea. The items listed above may not be a complete list of foods and beverages you should avoid. Contact a dietitian for more information. Questions to ask a health care provider  Do I need to meet with a diabetes educator?  Do I need to meet with a dietitian?  What number can I call if I have questions?  When are the best times to check my blood glucose? Where to find more information:  American Diabetes Association: diabetes.org  Academy of Nutrition and Dietetics: www.eatright.[employer] of Diabetes and Digestive and Kidney Diseases: [employer]  Association of Diabetes Care and Education Specialists: www.diabeteseducator.org Summary  It is important to have healthy eating habits because your blood sugar (glucose) levels are greatly affected by what you eat and drink.  A healthy meal plan will help you control your blood glucose and maintain a healthy lifestyle.  Your health care provider may recommend that you work with a dietitian to make a meal plan that is best for you.  Keep in mind that carbohydrates (carbs) and alcohol have immediate effects on your blood glucose levels. It is important to count carbs and to use alcohol carefully. This information is not intended to replace advice given to you by your health care provider. Make sure you discuss any questions you have with your health care provider. Document Revised: 07/04/2019  Document Reviewed: 07/04/2019 Elsevier Patient Education  2021 [employer].

## 2020-12-10 ENCOUNTER — Other Ambulatory Visit: Payer: Self-pay | Admitting: Internal Medicine

## 2020-12-11 ENCOUNTER — Other Ambulatory Visit: Payer: Self-pay | Admitting: Family Medicine

## 2020-12-11 ENCOUNTER — Other Ambulatory Visit (HOSPITAL_COMMUNITY): Payer: Self-pay

## 2020-12-11 DIAGNOSIS — E559 Vitamin D deficiency, unspecified: Secondary | ICD-10-CM

## 2020-12-11 MED ORDER — VITAMIN D (ERGOCALCIFEROL) 1.25 MG (50000 UNIT) PO CAPS
50000.0000 [IU] | ORAL_CAPSULE | ORAL | 0 refills | Status: DC
  Filled 2020-12-11: qty 12, 84d supply, fill #0

## 2020-12-11 NOTE — Progress Notes (Signed)
Spoke with patient, is aware. 

## 2020-12-16 ENCOUNTER — Encounter: Payer: Self-pay | Admitting: Family Medicine

## 2020-12-19 ENCOUNTER — Other Ambulatory Visit (HOSPITAL_COMMUNITY): Payer: Self-pay

## 2020-12-27 ENCOUNTER — Other Ambulatory Visit (HOSPITAL_COMMUNITY): Payer: Self-pay

## 2020-12-29 ENCOUNTER — Other Ambulatory Visit: Payer: Self-pay | Admitting: Internal Medicine

## 2020-12-29 DIAGNOSIS — Z794 Long term (current) use of insulin: Secondary | ICD-10-CM

## 2020-12-29 DIAGNOSIS — E1165 Type 2 diabetes mellitus with hyperglycemia: Secondary | ICD-10-CM

## 2021-01-07 ENCOUNTER — Other Ambulatory Visit: Payer: Self-pay | Admitting: Internal Medicine

## 2021-01-12 ENCOUNTER — Encounter: Payer: Self-pay | Admitting: Family Medicine

## 2021-01-13 ENCOUNTER — Ambulatory Visit: Payer: No Typology Code available for payment source | Admitting: Family Medicine

## 2021-01-13 NOTE — Telephone Encounter (Signed)
Patient was rescheduled before I could respond or call.

## 2021-01-20 ENCOUNTER — Ambulatory Visit: Payer: No Typology Code available for payment source | Admitting: Internal Medicine

## 2021-01-20 ENCOUNTER — Ambulatory Visit: Payer: No Typology Code available for payment source | Admitting: Family Medicine

## 2021-01-20 NOTE — Progress Notes (Deleted)
Patient ID: Rachel Dawson, female   DOB: 10-05-1980, 40 y.o.   MRN: [id number]   This visit occurred during the SARS-CoV-2 public health emergency.  Safety protocols were in place, including screening questions prior to the visit, additional usage of staff PPE, and extensive cleaning of exam room while observing appropriate contact time as indicated for disinfecting solutions.   HPI: Rachel Dawson is a 40 y.o.-year-old female, presenting for follow-up for DM2, dx in 2014, insulin-dependent, uncontrolled, without long term complications (but with hyperglycemia, yeast infections).  Last visit 9 months ago.  Previous visit was 1 year and 4 months prior.  Interim history: Before last visit she was working night shift and sugars were worse.  She was preparing to switch to dayshift. No increased urination, blurry vision, nausea, chest pain.   Last hemoglobin A1c was: Lab Results  Component Value Date   HGBA1C 6.8 (H) 12/09/2020   HGBA1C 7.8 (A) 04/30/2020   HGBA1C 7.3 (H) 11/02/2019  04/30/2020: HbA1c calculated from fructosamine is 6.6%, slightly better than before 04/14/2020: HbA1c 6.7% 09/23/2018: HbA1c calculated from fructosamine is 8.2%, lower than the measured one  but higher than before. 06/13/2018: HbA1c from fructosamine: 7.44%  Pt is on a regimen of: - Metformin ER 1000 mg 2x a day with meals -  >> Tresiba 28 units at bedtime She was on Victoza >> pancreatitis. She had nausea and diarrhea from regular metformin. She was on glimepiride 4 mg before dinner but ran out 08/2018 her last visit and we did not restart.   Pt checks her sugars twice a day: - am: 100-106 >> 79-114 >> 90-110 >> 109-115 - 2h after b'fast: n/c >> 90-143, 164 >> 125-130 - before lunch: n/c - 2h after lunch: n/c >> 100-139 >> n/c - before dinner: 130-140s >> 96-129, 133 >> 110 - 2h after dinner: n/c >> 114-157 >> 138-140 - bedtime: n/c - nighttime: n/c Lowest sugar was 79 >> 98; it is unclear at which level  she has hypoglycemia awareness. Highest sugar was 164 >> 220.  Glucometer:  One Touch Ultra  She is seen in the [facility] weight management clinic.  She eats a low-carb diet.  I advised her to stop juice at last visit.  -No CKD, last BUN/creatinine:   Ref Range & Units 04/18/2020  Sodium 135 - 146 MMOL/L 137   Potassium 3.5 - 5.3 MMOL/L 3.9   Chloride 98 - 110 MMOL/L 103   CO2 23 - 30 MMOL/L 27   BUN 8 - 24 MG/DL 9   Glucose 70 - 99 MG/DL 220 High    Creatinine 0.50 - 1.50 MG/DL 0.87   Calcium 8.5 - 10.5 MG/DL 8.9   Anion Gap 4 - 14 MMOL/L 7   Est. GFR African American >=60 ML/MIN/1.73 M*2  ML/MIN/1.73 M*2 >=90    Lab Results  Component Value Date   BUN 21 11/02/2019   BUN 19 12/21/2018   CREATININE 0.86 11/02/2019   CREATININE 0.97 12/21/2018   -+ HL; last set of lipids: Lab Results  Component Value Date   CHOL 189 12/09/2020   HDL 49.10 12/09/2020   LDLCALC 124 (H) 12/09/2020   LDLDIRECT 156.0 06/13/2018   TRIG 82.0 12/09/2020   CHOLHDL 4 12/09/2020  She did not start pravastatin as suggested.  - last eye exam was on 10/25/2019: No DR  - no numbness and tingling in her feet.  Pt has FH of DM in mother (?)  -She is adopted.  ROS: Constitutional: no weight  gain/no weight loss, no fatigue, no subjective hyperthermia, no subjective hypothermia Eyes: no blurry vision, no xerophthalmia ENT: no sore throat, no nodules palpated in neck, no dysphagia, no odynophagia, no hoarseness Cardiovascular: no CP/no SOB/no palpitations/no leg swelling Respiratory: no cough/no SOB/no wheezing Gastrointestinal: no N/no V/no D/no C/no acid reflux Musculoskeletal: no muscle aches/no joint aches Skin: no rashes, no hair lossa Neurological: no tremors/no numbness/no tingling/no dizziness  I reviewed pt's medications, allergies, PMH, social hx, family hx, and changes were documented in the history of present illness. Otherwise, unchanged from my initial visit note.  Past Medical History:   Diagnosis Date   Anemia    Fatty liver    History of blood transfusion    "when I had tonsils taken out & w/hyster"   Hypercholesterolemia    Hypertension    Menometrorrhagia    Microcytic anemia    Type II diabetes mellitus ([place])    Vitamin D deficiency    Past Surgical History:  Procedure Laterality Date   ABDOMINAL HYSTERECTOMY  ~ 2012   [place]; 2004; 2006   HIP PINNING Bilateral ~ 1991   "balls had dropped out of their sockets"   [place]  2006   Social History   Social History   Marital status: Single    Spouse name: N/A   Number of children: 2    Occupational History   Lab tech   Social History Main Topics   Smoking status: Former Smoker    Packs/day: 0.12 - 2 cigs a day    Years: 4.00    Types: Cigarettes    Quit date: 07/10/2014   Smokeless tobacco: Never Used   Alcohol use Yes     Comment: 11/22/2014 "might drink a little a couple times/month, if that"   Drug use: No   Sexual activity: Not Currently    Birth control/ protection: Surgical   Current Outpatient Medications on File Prior to Visit  Medication Sig Dispense Refill   Accu-Chek Softclix Lancets lancets USE AS DIRECTED 100 each 0   blood glucose meter kit and supplies KIT Dispense based on patient and insurance preference.  ICD-9 250.00, 250.01. 1 each 0   Blood Pressure Monitoring (BLOOD PRESSURE MONITOR/L CUFF) MISC Check your blood pressure at the same time each day. 1 each 0   buPROPion (WELLBUTRIN SR) 150 MG 12 hr tablet Take 1 tablet (150 mg total) by mouth daily. (Patient not taking: Reported on 12/09/2020) 30 tablet 0   glucose blood (ACCU-CHEK AVIVA PLUS) test strip Use to check blood sugar twice a day. 100 each 11   hydrOXYzine (ATARAX/VISTARIL) 50 MG tablet Take 1 tablet (50 mg total) by mouth 3 (three) times daily as needed for anxiety. 60 tablet 3   Insulin Pen Needle (BD PEN NEEDLE NANO U/F) 32G X 4 MM MISC Use one needle twice a  day 100 each 3   irbesartan (AVAPRO) 75 MG tablet Take 1 tablet (75 mg total) by mouth daily. 30 tablet 3   metFORMIN (GLUCOPHAGE-XR) 500 MG 24 hr tablet TAKE 2 TABLETS BY MOUTH 2 TIMES DAILY WITH A MEAL. 60 tablet 0   naproxen (NAPROSYN) 500 MG tablet Take 1 tablet (500 mg total) by mouth 2 (two) times daily. (Patient not taking: Reported on 12/09/2020) 30 tablet 0   rizatriptan (MAXALT) 5 MG tablet Take 1 tablet (5 mg total) by mouth as needed for migraine. May repeat in 2 hours if needed (  Patient not taking: Reported on 12/09/2020) 10 tablet 0   TRESIBA FLEXTOUCH 200 UNIT/ML FlexTouch Pen INJECT 28 UNITS INTO THE SKIN DAILY. 15 mL 3   Vitamin D, Ergocalciferol, (DRISDOL) 1.25 MG (50000 UNIT) CAPS capsule Take 1 capsule (50,000 Units total) by mouth every 7 (seven) days. 12 capsule 0   [DISCONTINUED] ferrous fumarate (HEMOCYTE - 106 MG FE) 325 (106 FE) MG TABS Take 1 tablet by mouth daily.       [DISCONTINUED] medroxyPROGESTERone (PROVERA) 10 MG tablet Take 10 mg by mouth daily.       No current facility-administered medications on file prior to visit.   Allergies  Allergen Reactions   Latex Rash   Family History  Adopted: Yes  Problem Relation Age of Onset   Hypertension Daughter     PE: LMP 01/05/2011 (Exact Date)  Wt Readings from Last 3 Encounters:  12/09/20 (!) 304 lb 9.6 oz (138.2 kg)  04/30/20 288 lb (130.6 kg)  02/28/20 286 lb (129.7 kg)   Constitutional: overweight, in NAD Eyes: PERRLA, EOMI, no exophthalmos ENT: moist mucous membranes, no thyromegaly, no cervical lymphadenopathy Cardiovascular: RRR, No MRG Respiratory: CTA B Gastrointestinal: abdomen soft, NT, ND, BS+ Musculoskeletal: no deformities, strength intact in all 4 Skin: moist, warm, no rashes Neurological: no tremor with outstretched hands, DTR normal in all 4  ASSESSMENT: 1. DM2, insulin-dependent, uncontrolled, without long term complications, but with hyperglycemia  2. Obesity BMI Classification: <  18.5 underweight  18.5-24.9 normal weight  25.0-29.9 overweight  30.0-34.9 class I obesity  35.0-39.9 class II obesity  ? 40.0 class III obesity   3. HL  PLAN:  1. Patient with history of uncontrolled diabetes, on oral antidiabetic regimen with metformin and also long-acting insulin, returning after another long absence of 9 months.  We are limited in the medication choices for her diabetes due to her history of frequent urination from chlorthalidone and pancreatitis while on Victoza so is just inhibitors and GLP-1 receptor agonist are not first options for her.  She was previously seen in the weight management clinic but not anymore.  She gained approximately 16 pounds in the last 6 months.  However, she had a recent HbA1c which was 6.8%, decreased from 7.8% at last visit.  At last visit, I advised her to stop juice.  - I suggested to:  Patient Instructions  Please continue: - Metformin ER 1000 mg 2x a day with meals - Tresiba 28 units at bedtime  Please stop juice.  Please return in 4 months with your sugar log.   - we checked her HbA1c: 7%  - advised to check sugars at different times of the day - 1x a day, rotating check times - advised for yearly eye exams >> she is UTD - return to clinic in 4 months  2. Obesity class 3 -Unfortunately, we cannot use a GLP-1 receptor agonist for her as she developed pancreatitis while on Victoza.  She also had frequent urination on chlorthalidone so we could not use SGLT2 inhibitors.  However, she is now off the diuretic. -Before last visit, she gained approximately 20 pounds and she started to go to the weight management clinic.  She worked with Dr. Leafy Ro. -However, she is now not going to the weight management clinic anymore.  She gained 16 pounds in the last 6 months. -We discussed at last visit about reconsidering SGLT2 inhibitors but she had an active perineal infection at last visit so we could not do this  3. HL -Reviewed  latest lipid  panel from 12/2020: LDL remains above target, the rest of the fractions at goal: Lab Results  Component Value Date   CHOL 189 12/09/2020   HDL 49.10 12/09/2020   LDLCALC 124 (H) 12/09/2020   LDLDIRECT 156.0 06/13/2018   TRIG 82.0 12/09/2020   CHOLHDL 4 12/09/2020  -I suggested pravastatin at the previous visits, but she did not start  Philemon Kingdom, MD PhD [facility] Endocrinology

## 2021-01-21 ENCOUNTER — Other Ambulatory Visit: Payer: Self-pay

## 2021-01-21 ENCOUNTER — Other Ambulatory Visit: Payer: Self-pay | Admitting: Internal Medicine

## 2021-01-21 ENCOUNTER — Other Ambulatory Visit (HOSPITAL_COMMUNITY): Payer: Self-pay

## 2021-01-21 ENCOUNTER — Ambulatory Visit (INDEPENDENT_AMBULATORY_CARE_PROVIDER_SITE_OTHER): Payer: Medicaid Other | Admitting: Internal Medicine

## 2021-01-21 ENCOUNTER — Encounter: Payer: Self-pay | Admitting: Internal Medicine

## 2021-01-21 VITALS — BP 132/88 | HR 76 | Ht 66.5 in | Wt 308.6 lb

## 2021-01-21 DIAGNOSIS — Z794 Long term (current) use of insulin: Secondary | ICD-10-CM | POA: Diagnosis not present

## 2021-01-21 DIAGNOSIS — E785 Hyperlipidemia, unspecified: Secondary | ICD-10-CM

## 2021-01-21 DIAGNOSIS — Z6841 Body Mass Index (BMI) 40.0 and over, adult: Secondary | ICD-10-CM

## 2021-01-21 DIAGNOSIS — E1165 Type 2 diabetes mellitus with hyperglycemia: Secondary | ICD-10-CM

## 2021-01-21 MED ORDER — PRAVASTATIN SODIUM 20 MG PO TABS: 20.0000 mg | ORAL_TABLET | Freq: Every day | ORAL | 3 refills | Status: DC

## 2021-01-21 MED ORDER — PRAVASTATIN SODIUM 20 MG PO TABS
20.0000 mg | ORAL_TABLET | Freq: Every day | ORAL | 3 refills | Status: DC
  Filled 2021-01-21: qty 90, 90d supply, fill #0

## 2021-01-21 NOTE — Progress Notes (Signed)
Patient ID: Rachel Dawson, female   DOB: 08-14-1980, 40 y.o.   MRN: [id number]   This visit occurred during the SARS-CoV-2 public health emergency.  Safety protocols were in place, including screening questions prior to the visit, additional usage of staff PPE, and extensive cleaning of exam room while observing appropriate contact time as indicated for disinfecting solutions.   HPI: Rachel Dawson is a 40 y.o.-year-old female, presenting for follow-up for DM2, dx in 2014, insulin-dependent, uncontrolled, without long term complications (but with hyperglycemia, yeast infections).  Last visit 9 months ago.  Previous visit was 1 year and 4 months prior.  Interim history: Before last visit she was working night shift and sugars were worse.  She was preparing to switch to dayshift. She did so, but now back on working nights and some days.  She was switched back to only days in 3 months. She started to go to the gym 2 weeks ago; also Pilates - 30-45 min a day. No increased urination, blurry vision, nausea, chest pain.  Last hemoglobin A1c was: Lab Results  Component Value Date   HGBA1C 6.8 (H) 12/09/2020   HGBA1C 7.8 (A) 04/30/2020   HGBA1C 7.3 (H) 11/02/2019  04/30/2020: HbA1c calculated from fructosamine is 6.6%, slightly better than before 04/14/2020: HbA1c 6.7% 09/23/2018: HbA1c calculated from fructosamine is 8.2%, lower than the measured one  but higher than before. 06/13/2018: HbA1c from fructosamine: 7.44%  Pt is on a regimen of: - Metformin ER 1000 mg 2x a day with meals -  >> Tresiba 28 units at bedtime She was on Victoza >> pancreatitis. She had nausea and diarrhea from regular metformin. She was on glimepiride 4 mg before dinner but ran out 08/2018 her last visit and we did not restart.   Pt checks her sugars twice a day: - am: 79-114 >> 90-110 >> 109-115 >> (before her dinner): 130s - 2h after b'fast: n/c >> 90-143, 164 >> 125-130 >> n/c - before lunch: n/c >> her lunch - 2h  after lunch: n/c >> 100-139 >> n/c - before dinner: 130-140s >> 96-129, 133 >> 110 >> (waking up): 125-146 - 2h after dinner: n/c >> 114-157 >> 138-140 >> n/c - bedtime: n/c - nighttime: n/c Lowest sugar was 79 >> 98 >> 125; it is unclear at which level she has hypoglycemia awareness. Highest sugar was 164 >> 220 >> 146  Glucometer:  One Touch Ultra  She is seen in the [facility] weight management clinic.  She eats a low-carb diet.  I advised her to stop juice at last visit.  She is now only drinking occasional diet sodas.    -No CKD, last BUN/creatinine:   Ref Range & Units 04/18/2020  Sodium 135 - 146 MMOL/L 137   Potassium 3.5 - 5.3 MMOL/L 3.9   Chloride 98 - 110 MMOL/L 103   CO2 23 - 30 MMOL/L 27   BUN 8 - 24 MG/DL 9   Glucose 70 - 99 MG/DL 220 High    Creatinine 0.50 - 1.50 MG/DL 0.87   Calcium 8.5 - 10.5 MG/DL 8.9   Anion Gap 4 - 14 MMOL/L 7   Est. GFR African American >=60 ML/MIN/1.73 M*2  ML/MIN/1.73 M*2 >=90    Lab Results  Component Value Date   BUN 21 11/02/2019   BUN 19 12/21/2018   CREATININE 0.86 11/02/2019   CREATININE 0.97 12/21/2018   -+ HL; last set of lipids: Lab Results  Component Value Date   CHOL 189 12/09/2020   HDL  49.10 12/09/2020   LDLCALC 124 (H) 12/09/2020   LDLDIRECT 156.0 06/13/2018   TRIG 82.0 12/09/2020   CHOLHDL 4 12/09/2020  She did not start pravastatin as suggested.  - last eye exam was on 10/25/2019: No DR  - no numbness and tingling in her feet.  Pt has FH of DM in mother (?)  -She is adopted.  ROS: Constitutional: no weight gain/no weight loss, no fatigue, no subjective hyperthermia, no subjective hypothermia Eyes: no blurry vision, no xerophthalmia ENT: no sore throat, no nodules palpated in neck, no dysphagia, no odynophagia, no hoarseness Cardiovascular: no CP/no SOB/no palpitations/no leg swelling Respiratory: no cough/no SOB/no wheezing Gastrointestinal: no N/no V/no D/no C/no acid reflux Musculoskeletal: no muscle aches/no  joint aches Skin: no rashes, no hair lossa Neurological: no tremors/no numbness/no tingling/no dizziness  I reviewed pt's medications, allergies, PMH, social hx, family hx, and changes were documented in the history of present illness. Otherwise, unchanged from my initial visit note.  Past Medical History:  Diagnosis Date   Anemia    Fatty liver    History of blood transfusion    "when I had tonsils taken out & w/hyster"   Hypercholesterolemia    Hypertension    Menometrorrhagia    Microcytic anemia    Type II diabetes mellitus ([place])    Vitamin D deficiency    Past Surgical History:  Procedure Laterality Date   ABDOMINAL HYSTERECTOMY  ~ 2012   [place]; 2004; 2006   HIP PINNING Bilateral ~ 1991   "balls had dropped out of their sockets"   [place]  2006   Social History   Social History   Marital status: Single    Spouse name: N/A   Number of children: 2    Occupational History   Lab tech   Social History Main Topics   Smoking status: Former Smoker    Packs/day: 0.12 - 2 cigs a day    Years: 4.00    Types: Cigarettes    Quit date: 07/10/2014   Smokeless tobacco: Never Used   Alcohol use Yes     Comment: 11/22/2014 "might drink a little a couple times/month, if that"   Drug use: No   Sexual activity: Not Currently    Birth control/ protection: Surgical   Current Outpatient Medications on File Prior to Visit  Medication Sig Dispense Refill   Accu-Chek Softclix Lancets lancets USE AS DIRECTED 100 each 0   blood glucose meter kit and supplies KIT Dispense based on patient and insurance preference.  ICD-9 250.00, 250.01. 1 each 0   Blood Pressure Monitoring (BLOOD PRESSURE MONITOR/L CUFF) MISC Check your blood pressure at the same time each day. 1 each 0   buPROPion (WELLBUTRIN SR) 150 MG 12 hr tablet Take 1 tablet (150 mg total) by mouth daily. (Patient not taking: Reported on 12/09/2020) 30 tablet 0   glucose  blood (ACCU-CHEK AVIVA PLUS) test strip Use to check blood sugar twice a day. 100 each 11   hydrOXYzine (ATARAX/VISTARIL) 50 MG tablet Take 1 tablet (50 mg total) by mouth 3 (three) times daily as needed for anxiety. 60 tablet 3   Insulin Pen Needle (BD PEN NEEDLE NANO U/F) 32G X 4 MM MISC Use one needle twice a day 100 each 3   irbesartan (AVAPRO) 75 MG tablet Take 1 tablet (75 mg total) by mouth daily. 30 tablet 3   metFORMIN (GLUCOPHAGE-XR) 500 MG 24 hr tablet  TAKE 2 TABLETS BY MOUTH 2 TIMES DAILY WITH A MEAL. 60 tablet 0   naproxen (NAPROSYN) 500 MG tablet Take 1 tablet (500 mg total) by mouth 2 (two) times daily. (Patient not taking: Reported on 12/09/2020) 30 tablet 0   rizatriptan (MAXALT) 5 MG tablet Take 1 tablet (5 mg total) by mouth as needed for migraine. May repeat in 2 hours if needed (Patient not taking: Reported on 12/09/2020) 10 tablet 0   TRESIBA FLEXTOUCH 200 UNIT/ML FlexTouch Pen INJECT 28 UNITS INTO THE SKIN DAILY. 15 mL 3   Vitamin D, Ergocalciferol, (DRISDOL) 1.25 MG (50000 UNIT) CAPS capsule Take 1 capsule (50,000 Units total) by mouth every 7 (seven) days. 12 capsule 0   [DISCONTINUED] ferrous fumarate (HEMOCYTE - 106 MG FE) 325 (106 FE) MG TABS Take 1 tablet by mouth daily.       [DISCONTINUED] medroxyPROGESTERone (PROVERA) 10 MG tablet Take 10 mg by mouth daily.       No current facility-administered medications on file prior to visit.   Allergies  Allergen Reactions   Latex Rash   Family History  Adopted: Yes  Problem Relation Age of Onset   Hypertension Daughter     PE: BP 132/88 (BP Location: Left Wrist, Patient Position: Sitting, Cuff Size: Normal)   Pulse 76   Ht 5' 6.5" (1.689 m)   Wt (!) 308 lb 9.6 oz (140 kg)   LMP 01/05/2011 (Exact Date)   SpO2 96%   BMI 49.06 kg/m  Wt Readings from Last 3 Encounters:  01/21/21 (!) 308 lb 9.6 oz (140 kg)  12/09/20 (!) 304 lb 9.6 oz (138.2 kg)  04/30/20 288 lb (130.6 kg)   Constitutional: overweight, in NAD Eyes:  PERRLA, EOMI, no exophthalmos ENT: moist mucous membranes, no thyromegaly, no cervical lymphadenopathy Cardiovascular: RRR, No MRG Respiratory: CTA B Gastrointestinal: abdomen soft, NT, ND, BS+ Musculoskeletal: no deformities, strength intact in all 4 Skin: moist, warm, no rashes Neurological: no tremor with outstretched hands, DTR normal in all 4  ASSESSMENT: 1. DM2, insulin-dependent, uncontrolled, without long term complications, but with hyperglycemia  2. Obesity BMI Classification: < 18.5 underweight  18.5-24.9 normal weight  25.0-29.9 overweight  30.0-34.9 class I obesity  35.0-39.9 class II obesity  ? 40.0 class III obesity   3. HL  PLAN:  1. Patient with history of uncontrolled diabetes, on oral antidiabetic regimen with metformin and also long-acting insulin, returning after another long absence of 9 months.  We are limited in the medication choices for her diabetes due to her history of frequent urination from chlorthalidone and pancreatitis while on Victoza.  She was previously seeing the weight management clinic, but not anymore.  Recent HbA1c was 6.8%, decreased from 7.8% at last visit.  At that time, I advised her to stop juice.   -At today's visit, she unfortunately switched back to night shift, after a period in which she worked dayshift and her diabetes started to improve.  She also started to go to the gym, which she tries to continue.  She had to switch back to denied that there were short staffed at work but she plans to go back to working dayshift in 04/2021.  As of now, whenever she checks her blood sugars, they are mainly at goal, with 3 exceptions.  She is trying to work on her diet and for now she eats lunch in the middle of the day (in the middle of her sleep period) and we discussed that it may be better for  her to eat lunch in the middle of the night, at work.  She will try this. - I suggested to:  Patient Instructions  Please continue: - Metformin ER 1000 mg  2x a day with meals - Tresiba 28 units at bedtime  Please start Pravastatin 20 mg daily.  Please return in 4 months with your sugar log.   - advised to check sugars at different times of the day - 1x a day, rotating check times - advised for yearly eye exams >> she is UTD - return to clinic in 4 months  2. Obesity class 3 -Unfortunately, we cannot use a GLP-1 receptor agonist for her as she developed pancreatitis while on Victoza.  She also had frequent urination on chlorthalidone in the past so we could not use issues behaviors.  However, she is now off the diuretic.  At last visit she had an active perineal infection so we could not start SGLT2 inhibitors. -Before last visit, she gained 20 pounds and she started to be seen by Dr. Leafy Ro in the weight management clinic.  However, she stopped going to the clinic - gained 16 pounds in the last 6 months. -she gained another 20 lbs since last OV - she started going to the gym now >> she will continue  3. HL -Reviewed latest lipid panel from 12/2020: LDL remains above target, the rest of the fractions are at goal: Lab Results  Component Value Date   CHOL 189 12/09/2020   HDL 49.10 12/09/2020   LDLCALC 124 (H) 12/09/2020   LDLDIRECT 156.0 06/13/2018   TRIG 82.0 12/09/2020   CHOLHDL 4 12/09/2020  -I suggested pravastatin at previous visit, but she did not start  -At this visit, I again suggest a low-dose pravastatin and discussed about benefits and possible side effects.   She agrees to try it.    Philemon Kingdom, MD PhD [facility] Endocrinology

## 2021-01-21 NOTE — Patient Instructions (Addendum)
Please continue: - Metformin ER 1000 mg 2x a day with meals - Tresiba 28 units at bedtime  Please start Pravastatin 20 mg daily.  Please return in 4 months with your sugar log.

## 2021-01-24 ENCOUNTER — Ambulatory Visit: Payer: No Typology Code available for payment source | Admitting: Family Medicine

## 2021-01-24 ENCOUNTER — Other Ambulatory Visit: Payer: Self-pay

## 2021-01-27 ENCOUNTER — Other Ambulatory Visit: Payer: Self-pay

## 2021-01-27 ENCOUNTER — Ambulatory Visit (INDEPENDENT_AMBULATORY_CARE_PROVIDER_SITE_OTHER): Payer: Medicaid Other | Admitting: Family Medicine

## 2021-01-27 ENCOUNTER — Encounter: Payer: Self-pay | Admitting: Family Medicine

## 2021-01-27 VITALS — BP 130/92 | HR 83 | Temp 98.6°F | Wt 309.4 lb

## 2021-01-27 DIAGNOSIS — E1169 Type 2 diabetes mellitus with other specified complication: Secondary | ICD-10-CM

## 2021-01-27 DIAGNOSIS — Z794 Long term (current) use of insulin: Secondary | ICD-10-CM

## 2021-01-27 DIAGNOSIS — I10 Essential (primary) hypertension: Secondary | ICD-10-CM

## 2021-01-27 NOTE — Progress Notes (Signed)
Subjective:    Patient ID: Rachel Dawson, female    DOB: 07-Oct-1980, 40 y.o.   MRN: [id number]  Chief Complaint  Patient presents with   Follow-up    HPI Patient was seen today for f/u on HTN and DM.  Pt working nights at main job in [place] and days at part time job on the [employer] here in [employer] [place].  BP improved on irbesartan 75 mg.  Pt having difficulty finding food to eat during the wk as she has a microwave in her hotel room.  Exercising when able.  Past Medical History:  Diagnosis Date   Anemia    Fatty liver    History of blood transfusion    "when I had tonsils taken out & w/hyster"   Hypercholesterolemia    Hypertension    Menometrorrhagia    Microcytic anemia    Type II diabetes mellitus (HCC)    Vitamin D deficiency     Allergies  Allergen Reactions   Latex Rash    ROS General: Denies fever, chills, night sweats, changes in weight, changes in appetite HEENT: Denies headaches, ear pain, changes in vision, rhinorrhea, sore throat CV: Denies CP, palpitations, SOB, orthopnea Pulm: Denies SOB, cough, wheezing GI: Denies abdominal pain, nausea, vomiting, diarrhea, constipation GU: Denies dysuria, hematuria, frequency, vaginal discharge Msk: Denies muscle cramps, joint pains Neuro: Denies weakness, numbness, tingling Skin: Denies rashes, bruising Psych: Denies depression, anxiety, hallucinations     Objective:    Blood pressure (!) 130/92, pulse 83, temperature 98.6 F (37 C), temperature source Oral, weight (!) 309 lb 6.4 oz (140.3 kg), last menstrual period 01/05/2011, SpO2 95 %.  Gen. Pleasant, well-nourished, in no distress, normal affect   HEENT: [place]/AT, face symmetric, conjunctiva clear, no scleral icterus, PERRLA, EOMI, nares patent without drainage Lungs: no accessory muscle use, CTAB, no wheezes or rales Cardiovascular: RRR, no m/r/g, no peripheral edema Musculoskeletal: No deformities, no cyanosis or clubbing, normal tone Neuro:  A&Ox3, CN II-XII intact, normal  gait Skin:  Warm, no lesions/ rash   Wt Readings from Last 3 Encounters:  01/27/21 (!) 309 lb 6.4 oz (140.3 kg)  01/21/21 (!) 308 lb 9.6 oz (140 kg)  12/09/20 (!) 304 lb 9.6 oz (138.2 kg)    Lab Results  Component Value Date   WBC 7.1 11/02/2019   HGB 12.6 11/02/2019   HCT 39.3 11/02/2019   PLT 309 11/02/2019   GLUCOSE 104 (H) 11/02/2019   CHOL 189 12/09/2020   TRIG 82.0 12/09/2020   HDL 49.10 12/09/2020   LDLDIRECT 156.0 06/13/2018   LDLCALC 124 (H) 12/09/2020   ALT 17 11/02/2019   AST 15 11/02/2019   NA 138 11/02/2019   K 3.7 11/02/2019   CL 100 11/02/2019   CREATININE 0.86 11/02/2019   BUN 21 11/02/2019   CO2 24 11/02/2019   TSH 1.40 12/09/2020   INR 1.18 11/22/2014   HGBA1C 6.8 (H) 12/09/2020   MICROALBUR 1.1 12/09/2020    Assessment/Plan:  Essential hypertension -elevated -discussed low sodium food options -continue lifestyle modifications -continue irbesartan 75 mg   Type 2 diabetes mellitus with other specified complication, with long-term current use of insulin (HCC) -hgb A1C 6.8% on 12/09/20 -continue tresiba 28 units daily -on ARB and statin -foot exam done 12/09/20 -schedule eye exam -continue f/u with Endo, Dr. Cruzita Lederer.  F/u in 2-3 months  Grier Mitts, MD

## 2021-02-15 ENCOUNTER — Encounter: Payer: Self-pay | Admitting: Family Medicine

## 2021-03-08 ENCOUNTER — Encounter: Payer: Self-pay | Admitting: Family Medicine

## 2021-03-10 ENCOUNTER — Encounter: Payer: Self-pay | Admitting: Family Medicine

## 2021-03-10 ENCOUNTER — Other Ambulatory Visit: Payer: Self-pay

## 2021-03-10 ENCOUNTER — Ambulatory Visit (INDEPENDENT_AMBULATORY_CARE_PROVIDER_SITE_OTHER): Payer: Medicaid Other | Admitting: Family Medicine

## 2021-03-10 VITALS — BP 140/70 | HR 80 | Temp 98.6°F | Wt 308.2 lb

## 2021-03-10 DIAGNOSIS — A599 Trichomoniasis, unspecified: Secondary | ICD-10-CM

## 2021-03-10 MED ORDER — METRONIDAZOLE 500 MG PO TABS: 500.0000 mg | ORAL_TABLET | Freq: Two times a day (BID) | ORAL | 0 refills | Status: AC

## 2021-03-10 NOTE — Progress Notes (Signed)
Established Patient Office Visit  Subjective:  Patient ID: Rachel Dawson, female    DOB: 07-18-1981  Age: 40 y.o. MRN: [id number]  CC:  Chief Complaint  Patient presents with   urinary symptoms    Ested her urine herself at work and UA was positive for trichomonas     HPI  [name] presents for possible trichomonas.  She works as a Quarry manager at [employer] and actually tested her own urine and this came back positive for trichomonas.  She had some mild urine frequency.  She has not noted any vaginal discharge.  She has diabetes and was checking for glucosuria when the trichomoniasis was detected.   No fever.  No pelvic pain.  She states she has only had 1 partner and has not been active sexually in the past month.  She states her partner has been fully tested for STDs recently and she had a copy of those labs and they were all negative.  Patient also gets tested for STDs yearly by her GYN and states they have been consistently negative.  She basically is requesting treatment for her trichomonas now at this time.  No fevers or chills.  No rashes.  Past Medical History:  Diagnosis Date   Anemia    Fatty liver    History of blood transfusion    "when I had tonsils taken out & w/hyster"   Hypercholesterolemia    Hypertension    Menometrorrhagia    Microcytic anemia    Type II diabetes mellitus ([place])    Vitamin D deficiency     Past Surgical History:  Procedure Laterality Date   ABDOMINAL HYSTERECTOMY  ~ 2012   [place]; 2004; 2006   HIP PINNING Bilateral ~ 1991   "balls had dropped out of their sockets"   [place]  2006    Family History  Adopted: Yes  Problem Relation Age of Onset   Hypertension Daughter     Social History   Socioeconomic History   Marital status: Single    Spouse name: Not on file   Number of children: 3   Years of education: Not on file   Highest education level: Associate degree:  academic program  Occupational History   Occupation: Med Engineer, production: [employer]  Tobacco Use   Smoking status: Former    Packs/day: 0.12    Years: 4.00    Pack years: 0.48    Types: Cigarettes    Quit date: 07/10/2014    Years since quitting: 6.6   Smokeless tobacco: Never  Vaping Use   Vaping Use: Never used  Substance and Sexual Activity   Alcohol use: Yes    Comment: occ   Drug use: No   Sexual activity: Not Currently    Birth control/protection: Surgical  Other Topics Concern   Not on file  Social History Narrative   Patient is right-handed. She is single. She lives in a single level home. She rarely drinks caffeine. She walks daily, and has recently began aeorbic exercises for 30 minutes daily.   Social Determinants of Health   Financial Resource Strain: Not on file  Food Insecurity: Not on file  Transportation Needs: Not on file  Physical Activity: Not on file  Stress: Not on file  Social Connections: Not on file  Intimate Partner Violence: Not on file    Outpatient Medications Prior to Visit  Medication Sig Dispense Refill  Accu-Chek Softclix Lancets lancets USE AS DIRECTED 100 each 0   blood glucose meter kit and supplies KIT Dispense based on patient and insurance preference.  ICD-9 250.00, 250.01. 1 each 0   Blood Pressure Monitoring (BLOOD PRESSURE MONITOR/L CUFF) MISC Check your blood pressure at the same time each day. 1 each 0   buPROPion (WELLBUTRIN SR) 150 MG 12 hr tablet Take 1 tablet (150 mg total) by mouth daily. 30 tablet 0   glucose blood (ACCU-CHEK AVIVA PLUS) test strip Use to check blood sugar twice a day. 100 each 11   hydrOXYzine (ATARAX/VISTARIL) 50 MG tablet Take 1 tablet (50 mg total) by mouth 3 (three) times daily as needed for anxiety. 60 tablet 3   Insulin Pen Needle (BD PEN NEEDLE NANO U/F) 32G X 4 MM MISC Use one needle twice a day 100 each 3   irbesartan (AVAPRO) 75 MG tablet Take 1 tablet (75 mg total) by mouth daily. 30  tablet 3   metFORMIN (GLUCOPHAGE-XR) 500 MG 24 hr tablet Take 2 tablets (1,000 mg total) by mouth 2 (two) times daily. Give w/food. 360 tablet 1   naproxen (NAPROSYN) 500 MG tablet Take 1 tablet (500 mg total) by mouth 2 (two) times daily. 30 tablet 0   pravastatin (PRAVACHOL) 20 MG tablet Take 1 tablet (20 mg total) by mouth daily. 90 tablet 3   rizatriptan (MAXALT) 5 MG tablet Take 1 tablet (5 mg total) by mouth as needed for migraine. May repeat in 2 hours if needed 10 tablet 0   TRESIBA FLEXTOUCH 200 UNIT/ML FlexTouch Pen INJECT 28 UNITS INTO THE SKIN DAILY. 15 mL 3   Vitamin D, Ergocalciferol, (DRISDOL) 1.25 MG (50000 UNIT) CAPS capsule Take 1 capsule (50,000 Units total) by mouth every 7 (seven) days. 12 capsule 0   No facility-administered medications prior to visit.    Allergies  Allergen Reactions   Latex Rash    ROS Review of Systems  Constitutional:  Negative for chills and fever.  Genitourinary:  Positive for frequency. Negative for pelvic pain, vaginal bleeding and vaginal pain.     Objective:    Physical Exam Vitals reviewed.  Constitutional:      Appearance: Normal appearance.  Cardiovascular:     Rate and Rhythm: Normal rate and regular rhythm.  Pulmonary:     Effort: Pulmonary effort is normal.     Breath sounds: Normal breath sounds.  Neurological:     Mental Status: She is alert.    BP 140/70 (BP Location: Left Arm, Patient Position: Sitting, Cuff Size: Normal)   Pulse 80   Temp 98.6 F (37 C) (Oral)   Wt (!) 308 lb 3.2 oz (139.8 kg)   LMP 01/05/2011 (Exact Date)   SpO2 99%   BMI 49.00 kg/m  Wt Readings from Last 3 Encounters:  03/10/21 (!) 308 lb 3.2 oz (139.8 kg)  01/27/21 (!) 309 lb 6.4 oz (140.3 kg)  01/21/21 (!) 308 lb 9.6 oz (140 kg)     Health Maintenance Due  Topic Date Due   PNEUMOCOCCAL POLYSACCHARIDE VACCINE AGE 62-64 HIGH RISK  Never done   OPHTHALMOLOGY EXAM  10/24/2020   INFLUENZA VACCINE  03/10/2021    There are no  preventive care reminders to display for this patient.  Lab Results  Component Value Date   TSH 1.40 12/09/2020   Lab Results  Component Value Date   WBC 7.1 11/02/2019   HGB 12.6 11/02/2019   HCT 39.3 11/02/2019   MCV 85  11/02/2019   PLT 309 11/02/2019   Lab Results  Component Value Date   NA 138 11/02/2019   K 3.7 11/02/2019   CO2 24 11/02/2019   GLUCOSE 104 (H) 11/02/2019   BUN 21 11/02/2019   CREATININE 0.86 11/02/2019   BILITOT 0.2 11/02/2019   ALKPHOS 80 11/02/2019   AST 15 11/02/2019   ALT 17 11/02/2019   PROT 7.3 11/02/2019   ALBUMIN 4.5 11/02/2019   CALCIUM 9.8 11/02/2019   ANIONGAP 10 07/05/2018   GFR 76.98 12/21/2018   Lab Results  Component Value Date   CHOL 189 12/09/2020   Lab Results  Component Value Date   HDL 49.10 12/09/2020   Lab Results  Component Value Date   LDLCALC 124 (H) 12/09/2020   Lab Results  Component Value Date   TRIG 82.0 12/09/2020   Lab Results  Component Value Date   CHOLHDL 4 12/09/2020   Lab Results  Component Value Date   HGBA1C 6.8 (H) 12/09/2020      Assessment & Plan:   Reported positive trichomonas on lab test at work. Patient denies any symptoms other than some mild urine frequency.  -We discussed testing for full scope of STDs given positive trichomonas but she states she gets these yearly through her gynecologist and she declines at this time. As above, her one partner was also recently tested and negative for any STDs  -We agreed to go ahead and treat with Flagyl 500 mg twice daily for 7 days but strongly advised that she get more complete STD testing if not here through GYN and she agrees  Meds ordered this encounter  Medications   metroNIDAZOLE (FLAGYL) 500 MG tablet    Sig: Take 1 tablet (500 mg total) by mouth 2 (two) times daily for 7 days.    Dispense:  14 tablet    Refill:  0    Follow-up: No follow-ups on file.    Carolann Littler, MD

## 2021-03-13 ENCOUNTER — Other Ambulatory Visit: Payer: Self-pay | Admitting: Family Medicine

## 2021-03-13 DIAGNOSIS — I10 Essential (primary) hypertension: Secondary | ICD-10-CM

## 2021-03-22 ENCOUNTER — Other Ambulatory Visit: Payer: Self-pay | Admitting: Family Medicine

## 2021-03-22 DIAGNOSIS — E559 Vitamin D deficiency, unspecified: Secondary | ICD-10-CM

## 2021-04-15 ENCOUNTER — Encounter: Payer: Self-pay | Admitting: Family Medicine

## 2021-05-07 DIAGNOSIS — Z01411 Encounter for gynecological examination (general) (routine) with abnormal findings: Secondary | ICD-10-CM | POA: Diagnosis not present

## 2021-05-07 DIAGNOSIS — Z1231 Encounter for screening mammogram for malignant neoplasm of breast: Secondary | ICD-10-CM | POA: Diagnosis not present

## 2021-05-07 DIAGNOSIS — Z1239 Encounter for other screening for malignant neoplasm of breast: Secondary | ICD-10-CM | POA: Diagnosis not present

## 2021-05-07 DIAGNOSIS — Z113 Encounter for screening for infections with a predominantly sexual mode of transmission: Secondary | ICD-10-CM | POA: Diagnosis not present

## 2021-05-07 DIAGNOSIS — L680 Hirsutism: Secondary | ICD-10-CM | POA: Diagnosis not present

## 2021-05-12 ENCOUNTER — Encounter: Payer: Self-pay | Admitting: Family Medicine

## 2021-05-25 ENCOUNTER — Other Ambulatory Visit: Payer: Self-pay | Admitting: Family Medicine

## 2021-06-02 ENCOUNTER — Ambulatory Visit: Payer: No Typology Code available for payment source | Admitting: Internal Medicine

## 2021-06-02 ENCOUNTER — Telehealth: Payer: Self-pay | Admitting: Internal Medicine

## 2021-06-02 DIAGNOSIS — Z794 Long term (current) use of insulin: Secondary | ICD-10-CM

## 2021-06-02 MED ORDER — BD PEN NEEDLE NANO U/F 32G X 4 MM MISC: 0 refills | Status: DC

## 2021-06-02 NOTE — Telephone Encounter (Signed)
Pt calling in had to reschedule appt to December per pt not feeling well. Pt to request refill for Insulin Pen Needle (BD PEN NEEDLE NANO U/F) 32G X 4 MM MISC to   [id number] [place] [place] [facility] [address]  [place] [address]   Pt contact [phone]

## 2021-06-02 NOTE — Progress Notes (Deleted)
Patient ID: [name], female   DOB: January 15, 1981, 40 y.o.   MRN: [id number]   This visit occurred during the SARS-CoV-2 public health emergency.  Safety protocols were in place, including screening questions prior to the visit, additional usage of staff PPE, and extensive cleaning of exam room while observing appropriate contact time as indicated for disinfecting solutions.   HPI: Rachel Dawson is a 40 y.o.-year-old female, presenting for follow-up for DM2, dx in 2014, insulin-dependent, uncontrolled, without long term complications (but with hyperglycemia, yeast infections).  Last visit 4 months ago.  Interim history: She continues to work night shift. She continues to go to the gym and does Pilates. No increased urination, blurry vision, nausea, chest pain.  Last hemoglobin A1c was: Lab Results  Component Value Date   HGBA1C 6.8 (H) 12/09/2020   HGBA1C 7.8 (A) 04/30/2020   HGBA1C 7.3 (H) 11/02/2019  04/30/2020: HbA1c calculated from fructosamine is 6.6%, slightly better than before 04/14/2020: HbA1c 6.7% 09/23/2018: HbA1c calculated from fructosamine is 8.2%, lower than the measured one  but higher than before. 06/13/2018: HbA1c from fructosamine: 7.44%  Pt is on a regimen of: - Metformin ER 1000 mg 2x a day with meals -  >> Tresiba 28 units at bedtime She was on Victoza >> pancreatitis. She had nausea and diarrhea from regular metformin. She was on glimepiride 4 mg before dinner but ran out 08/2018 her last visit and we did not restart.   Pt checks her sugars twice a day: - am: 90-110 >> 109-115 >> (before her dinner): 130s - 2h after b'fast: n/c >> 90-143, 164 >> 125-130 >> n/c - before lunch: n/c >> her lunch - 2h after lunch: n/c >> 100-139 >> n/c - before dinner: 96-129, 133 >> 110 >> (waking up): 125-146 - 2h after dinner: n/c >> 114-157 >> 138-140 >> n/c - bedtime: n/c - nighttime: n/c Lowest sugar was 79 >> 98 >> 125; it is unclear at which level she has hypoglycemia  awareness. Highest sugar was 164 >> 220 >> 146  Glucometer:  One Touch Ultra  She was seen in the [facility] weight management clinic, but not anymore.  She eats a low-carb diet.  Previously drinking juice, which we stopped.  She is now only drinking occasional diet sodas.    -No CKD, last BUN/creatinine:   Ref Range & Units 04/18/2020  Sodium 135 - 146 MMOL/L 137   Potassium 3.5 - 5.3 MMOL/L 3.9   Chloride 98 - 110 MMOL/L 103   CO2 23 - 30 MMOL/L 27   BUN 8 - 24 MG/DL 9   Glucose 70 - 99 MG/DL 220 High    Creatinine 0.50 - 1.50 MG/DL 0.87   Calcium 8.5 - 10.5 MG/DL 8.9   Anion Gap 4 - 14 MMOL/L 7   Est. GFR African American >=60 ML/MIN/1.73 M*2  ML/MIN/1.73 M*2 >=90    Lab Results  Component Value Date   BUN 21 11/02/2019   BUN 19 12/21/2018   CREATININE 0.86 11/02/2019   CREATININE 0.97 12/21/2018   -+ HL; last set of lipids: Lab Results  Component Value Date   CHOL 189 12/09/2020   HDL 49.10 12/09/2020   LDLCALC 124 (H) 12/09/2020   LDLDIRECT 156.0 06/13/2018   TRIG 82.0 12/09/2020   CHOLHDL 4 12/09/2020  She did not start pravastatin as suggested.  - last eye exam was on 10/25/2019: No DR  - no numbness and tingling in her feet.  Pt has FH of DM in mother (?)  -  She is adopted.  ROS: + see HPI  I reviewed pt's medications, allergies, PMH, social hx, family hx, and changes were documented in the history of present illness. Otherwise, unchanged from my initial visit note.  Past Medical History:  Diagnosis Date   Anemia    Fatty liver    History of blood transfusion    "when I had tonsils taken out & w/hyster"   Hypercholesterolemia    Hypertension    Menometrorrhagia    Microcytic anemia    Type II diabetes mellitus ([place])    Vitamin D deficiency    Past Surgical History:  Procedure Laterality Date   ABDOMINAL HYSTERECTOMY  ~ 2012   [place]; 2004; 2006   HIP PINNING Bilateral ~ 1991   "balls had dropped out of their sockets"   [place]  2006   Social History   Social History   Marital status: Single    Spouse name: N/A   Number of children: 2    Occupational History   Lab tech   Social History Main Topics   Smoking status: Former Smoker    Packs/day: 0.12 - 2 cigs a day    Years: 4.00    Types: Cigarettes    Quit date: 07/10/2014   Smokeless tobacco: Never Used   Alcohol use Yes     Comment: 11/22/2014 "might drink a little a couple times/month, if that"   Drug use: No   Sexual activity: Not Currently    Birth control/ protection: Surgical   Current Outpatient Medications on File Prior to Visit  Medication Sig Dispense Refill   Accu-Chek Softclix Lancets lancets USE AS DIRECTED 100 each 0   blood glucose meter kit and supplies KIT Dispense based on patient and insurance preference.  ICD-9 250.00, 250.01. 1 each 0   Blood Pressure Monitoring (BLOOD PRESSURE MONITOR/L CUFF) MISC Check your blood pressure at the same time each day. 1 each 0   buPROPion (WELLBUTRIN SR) 150 MG 12 hr tablet Take 1 tablet (150 mg total) by mouth daily. 30 tablet 0   glucose blood (ACCU-CHEK AVIVA PLUS) test strip Use to check blood sugar twice a day. 100 each 11   hydrOXYzine (ATARAX/VISTARIL) 50 MG tablet Take 1 tablet (50 mg total) by mouth 3 (three) times daily as needed for anxiety. 60 tablet 3   Insulin Pen Needle (BD PEN NEEDLE NANO U/F) 32G X 4 MM MISC Use one needle twice a day 100 each 3   irbesartan (AVAPRO) 75 MG tablet TAKE 1 TABLET BY MOUTH EVERY DAY 90 tablet 1   metFORMIN (GLUCOPHAGE-XR) 500 MG 24 hr tablet Take 2 tablets (1,000 mg total) by mouth 2 (two) times daily. Give w/food. 360 tablet 1   naproxen (NAPROSYN) 500 MG tablet Take 1 tablet (500 mg total) by mouth 2 (two) times daily. 30 tablet 0   pravastatin (PRAVACHOL) 20 MG tablet Take 1 tablet (20 mg total) by mouth daily. 90 tablet 3   rizatriptan (MAXALT) 5 MG tablet Take 1 tablet (5 mg total) by mouth as needed for migraine.  May repeat in 2 hours if needed 10 tablet 0   TRESIBA FLEXTOUCH 200 UNIT/ML FlexTouch Pen INJECT 28 UNITS INTO THE SKIN DAILY. 15 mL 3   Vitamin D, Ergocalciferol, (DRISDOL) 1.25 MG (50000 UNIT) CAPS capsule Take 1 capsule (50,000 Units total) by mouth every 7 (seven) days. 12 capsule 0   [DISCONTINUED] ferrous fumarate (HEMOCYTE -  106 MG FE) 325 (106 FE) MG TABS Take 1 tablet by mouth daily.       [DISCONTINUED] medroxyPROGESTERone (PROVERA) 10 MG tablet Take 10 mg by mouth daily.       No current facility-administered medications on file prior to visit.   Allergies  Allergen Reactions   Latex Rash   Family History  Adopted: Yes  Problem Relation Age of Onset   Hypertension Daughter     PE: LMP 01/05/2011 (Exact Date)  Wt Readings from Last 3 Encounters:  03/10/21 (!) 308 lb 3.2 oz (139.8 kg)  01/27/21 (!) 309 lb 6.4 oz (140.3 kg)  01/21/21 (!) 308 lb 9.6 oz (140 kg)   Constitutional: overweight, in NAD Eyes: PERRLA, EOMI, no exophthalmos ENT: moist mucous membranes, no thyromegaly, no cervical lymphadenopathy Cardiovascular: RRR, No MRG Respiratory: CTA B Gastrointestinal: abdomen soft, NT, ND, BS+ Musculoskeletal: no deformities, strength intact in all 4 Skin: moist, warm, no rashes Neurological: no tremor with outstretched hands, DTR normal in all 4  ASSESSMENT: 1. DM2, insulin-dependent, uncontrolled, without long term complications, but with hyperglycemia  2. Obesity BMI Classification: < 18.5 underweight  18.5-24.9 normal weight  25.0-29.9 overweight  30.0-34.9 class I obesity  35.0-39.9 class II obesity  ? 40.0 class III obesity   3. HL  PLAN:  1. Patient with history of uncontrolled diabetes on oral antidiabetic regimen with metformin and also long-acting insulin, with better control at last visit, when HbA1c decreased to 6.8%.  We are limited in the medication choices for her diabetes due to history of frequent urination while on chlorthalidone (now off)  and pancreatitis while on Victoza.  At last visit, she unfortunately switched back to night shift after period in which she worked dayshift and her diabetes started to improve.  She did start to go to the gym, which she continues.  Sugars were mostly at goal.  She was trying to work on her diet and she was eating lunch in the middle of the day (which was the middle of her sleep.)  And we discussed that it may have been better to eat lunch in the middle of the night, at work.  She decided to try this. -At this visit,  - I suggested to:  Patient Instructions  Please continue: - Metformin ER 1000 mg 2x a day with meals - Tresiba 28 units at bedtime  Please return in 4 months with your sugar log.   - we checked her HbA1c: 7%  - advised to check sugars at different times of the day - 1x a day, rotating check times - advised for yearly eye exams >> she is not UTD - return to clinic in 4 months  2. Obesity class 3 -Unfortunately, we cannot use a GLP-1 receptor agonist for her due to developing pancreatitis while on Victoza.  She also had frequent urination on chlorthalidone in the past so we could not use SGLT2 inhibitors.  Afterwards, she came came off the diuretic, but she had an active perineal infection previously so we did not start this class SGLT2 inhibitors. -She decided to see Dr. Leafy Ro in the weight management clinic but she did not continue with the program -Before last visit she gained 20 pounds and ~20 pounds before the previous visit -She is going to the gym, started before last visit  3. HL -Reviewed latest lipid panel from 12/2020: LDL above target, the rest of the fractions at goal: Lab Results  Component Value Date   CHOL 189 12/09/2020  HDL 49.10 12/09/2020   LDLCALC 124 (H) 12/09/2020   LDLDIRECT 156.0 06/13/2018   TRIG 82.0 12/09/2020   CHOLHDL 4 12/09/2020  -We started pravastatin 20 mg daily at last visit.  She has no side effects from it.  Philemon Kingdom, MD  PhD [facility]

## 2021-06-02 NOTE — Telephone Encounter (Signed)
Rx sent to preferred pharmacy.

## 2021-07-14 ENCOUNTER — Ambulatory Visit: Payer: Medicaid Other | Admitting: Internal Medicine

## 2021-07-14 NOTE — Progress Notes (Deleted)
Patient ID: Rachel Dawson, female   DOB: 03-10-1981, 40 y.o.   MRN: [id number]   This visit occurred during the SARS-CoV-2 public health emergency.  Safety protocols were in place, including screening questions prior to the visit, additional usage of staff PPE, and extensive cleaning of exam room while observing appropriate contact time as indicated for disinfecting solutions.   HPI: Rachel Dawson is a 40 y.o.-year-old female, presenting for follow-up for DM2, dx in 2014, insulin-dependent, uncontrolled, without long term complications (but with hyperglycemia, yeast infections).  Last visit 9 months ago.  Previous visit was 1 year and 4 months prior.  Interim history: She was previously working nights and sugars were worse.  At last visit she was working some nights and some days but was preparing to switch to only days.   Before last visit she started to go to the gym 2 weeks ago; also Pilates - 30-45 min a day. No increased urination, blurry vision, nausea, chest pain..  Last hemoglobin A1c was: Lab Results  Component Value Date   HGBA1C 6.8 (H) 12/09/2020   HGBA1C 7.8 (A) 04/30/2020   HGBA1C 7.3 (H) 11/02/2019  04/30/2020: HbA1c calculated from fructosamine is 6.6%, slightly better than before 04/14/2020: HbA1c 6.7% 09/23/2018: HbA1c calculated from fructosamine is 8.2%, lower than the measured one  but higher than before. 06/13/2018: HbA1c from fructosamine: 7.44%  Pt is on a regimen of: - Metformin ER 1000 mg 2x a day with meals -  >> Tresiba 28 units at bedtime She was on Victoza >> pancreatitis. She had nausea and diarrhea from regular metformin. She was on glimepiride 4 mg before dinner but ran out in 08/2018 and we did not restart.   Pt checks her sugars twice a day: - am: 79-114 >> 90-110 >> 109-115 >> (before her dinner): 130s - 2h after b'fast: n/c >> 90-143, 164 >> 125-130 >> n/c - before lunch: n/c >> her lunch - 2h after lunch: n/c >> 100-139 >> n/c - before dinner:   96-129, 133 >> 110 >> (waking up): 125-146 - 2h after dinner: n/c >> 114-157 >> 138-140 >> n/c - bedtime: n/c - nighttime: n/c Lowest sugar was 79 >> 98 >> 125; it is unclear at which level she has hypoglycemia awareness. Highest sugar was 164 >> 220 >> 146  Glucometer:  One Touch Ultra  She is seen in the [facility].  She eats a low-carb diet.  I advised her to stop juice at last visit.  She is now only drinking occasional diet sodas.    -No CKD, last BUN/creatinine:   Ref Range & Units 04/18/2020  Sodium 135 - 146 MMOL/L 137   Potassium 3.5 - 5.3 MMOL/L 3.9   Chloride 98 - 110 MMOL/L 103   CO2 23 - 30 MMOL/L 27   BUN 8 - 24 MG/DL 9   Glucose 70 - 99 MG/DL 220 High    Creatinine 0.50 - 1.50 MG/DL 0.87   Calcium 8.5 - 10.5 MG/DL 8.9   Anion Gap 4 - 14 MMOL/L 7   Est. GFR African American >=60 ML/MIN/1.73 M*2  ML/MIN/1.73 M*2 >=90    Lab Results  Component Value Date   BUN 21 11/02/2019   BUN 19 12/21/2018   CREATININE 0.86 11/02/2019   CREATININE 0.97 12/21/2018   -+ HL; last set of lipids: Lab Results  Component Value Date   CHOL 189 12/09/2020   HDL 49.10 12/09/2020   LDLCALC 124 (H) 12/09/2020   LDLDIRECT 156.0  06/13/2018   TRIG 82.0 12/09/2020   CHOLHDL 4 12/09/2020  She did not start pravastatin as suggested.  - last eye exam was on 10/25/2019: No DR  - no numbness and tingling in her feet.  Pt has FH of DM in mother (?)  -She is adopted.  ROS: + See HPI  I reviewed pt's medications, allergies, PMH, social hx, family hx, and changes were documented in the history of present illness. Otherwise, unchanged from my initial visit note.  Past Medical History:  Diagnosis Date   Anemia    Fatty liver    History of blood transfusion    "when I had tonsils taken out & w/hyster"   Hypercholesterolemia    Hypertension    Menometrorrhagia    Microcytic anemia    Type II diabetes mellitus ([place])    Vitamin D deficiency    Past Surgical History:   Procedure Laterality Date   ABDOMINAL HYSTERECTOMY  ~ 2012   [place]; 2004; 2006   HIP PINNING Bilateral ~ 1991   "balls had dropped out of their sockets"   [place]  2006   Social History   Social History   Marital status: Single    Spouse name: N/A   Number of children: 2    Occupational History   Lab tech   Social History Main Topics   Smoking status: Former Smoker    Packs/day: 0.12 - 2 cigs a day    Years: 4.00    Types: Cigarettes    Quit date: 07/10/2014   Smokeless tobacco: Never Used   Alcohol use Yes     Comment: 11/22/2014 "might drink a little a couple times/month, if that"   Drug use: No   Sexual activity: Not Currently    Birth control/ protection: Surgical   Current Outpatient Medications on File Prior to Visit  Medication Sig Dispense Refill   Accu-Chek Softclix Lancets lancets USE AS DIRECTED 100 each 0   blood glucose meter kit and supplies KIT Dispense based on patient and insurance preference.  ICD-9 250.00, 250.01. 1 each 0   Blood Pressure Monitoring (BLOOD PRESSURE MONITOR/L CUFF) MISC Check your blood pressure at the same time each day. 1 each 0   buPROPion (WELLBUTRIN SR) 150 MG 12 hr tablet Take 1 tablet (150 mg total) by mouth daily. 30 tablet 0   glucose blood (ACCU-CHEK AVIVA PLUS) test strip Use to check blood sugar twice a day. 100 each 11   hydrOXYzine (ATARAX/VISTARIL) 50 MG tablet Take 1 tablet (50 mg total) by mouth 3 (three) times daily as needed for anxiety. 60 tablet 3   Insulin Pen Needle (BD PEN NEEDLE NANO U/F) 32G X 4 MM MISC Use one needle twice a day 100 each 0   irbesartan (AVAPRO) 75 MG tablet TAKE 1 TABLET BY MOUTH EVERY DAY 90 tablet 1   metFORMIN (GLUCOPHAGE-XR) 500 MG 24 hr tablet Take 2 tablets (1,000 mg total) by mouth 2 (two) times daily. Give w/food. 360 tablet 1   naproxen (NAPROSYN) 500 MG tablet Take 1 tablet (500 mg total) by mouth 2 (two) times daily. 30 tablet  0   pravastatin (PRAVACHOL) 20 MG tablet Take 1 tablet (20 mg total) by mouth daily. 90 tablet 3   rizatriptan (MAXALT) 5 MG tablet Take 1 tablet (5 mg total) by mouth as needed for migraine. May repeat in 2 hours if needed 10 tablet 0   TRESIBA  FLEXTOUCH 200 UNIT/ML FlexTouch Pen INJECT 28 UNITS INTO THE SKIN DAILY. 15 mL 3   Vitamin D, Ergocalciferol, (DRISDOL) 1.25 MG (50000 UNIT) CAPS capsule Take 1 capsule (50,000 Units total) by mouth every 7 (seven) days. 12 capsule 0   [DISCONTINUED] ferrous fumarate (HEMOCYTE - 106 MG FE) 325 (106 FE) MG TABS Take 1 tablet by mouth daily.       [DISCONTINUED] medroxyPROGESTERone (PROVERA) 10 MG tablet Take 10 mg by mouth daily.       No current facility-administered medications on file prior to visit.   Allergies  Allergen Reactions   Latex Rash   Family History  Adopted: Yes  Problem Relation Age of Onset   Hypertension Daughter     PE: LMP 01/05/2011 (Exact Date)  Wt Readings from Last 3 Encounters:  03/10/21 (!) 308 lb 3.2 oz (139.8 kg)  01/27/21 (!) 309 lb 6.4 oz (140.3 kg)  01/21/21 (!) 308 lb 9.6 oz (140 kg)   Constitutional: overweight, in NAD Eyes: PERRLA, EOMI, no exophthalmos ENT: moist mucous membranes, no thyromegaly, no cervical lymphadenopathy Cardiovascular: RRR, No MRG Respiratory: CTA B Musculoskeletal: no deformities, strength intact in all 4 Skin: moist, warm, no rashes Neurological: no tremor with outstretched hands, DTR normal in all 4  ASSESSMENT: 1. DM2, insulin-dependent, uncontrolled, without long term complications, but with hyperglycemia  2. Obesity BMI Classification: < 18.5 underweight  18.5-24.9 normal weight  25.0-29.9 overweight  30.0-34.9 class I obesity  35.0-39.9 class II obesity  ? 40.0 class III obesity   3. HL  PLAN:  1. Patient   with history of uncontrolled diabetes, on oral antidiabetic regimen with metformin and also long-acting insulin, returning after another long absence of 9  months.  We are limited in the medication choices for her diabetes due to her history of frequent urination from chlorthalidone and pancreatitis while on Victoza.  She was previously seeing the weight management clinic, but not anymore.  Recent HbA1c was 6.8%, decreased from 7.8% at last visit.  At that time, I advised her to stop juice.   -At today's visit, she unfortunately switched back to night shift, after a period in which she worked dayshift and her diabetes started to improve.  She also started to go to the gym, which she tries to continue.  She had to switch back to denied that there were short staffed at work but she plans to go back to working dayshift in 04/2021.  As of now, whenever she checks her blood sugars, they are mainly at goal, with 3 exceptions.  She is trying to work on her diet and for now she eats lunch in the middle of the day (in the middle of her sleep period) and we discussed that it may be better for her to eat lunch in the middle of the night, at work.  She will try this.  - I suggested to:  Patient Instructions  Please continue: - Metformin ER 1000 mg 2x a day with meals - Tresiba 28 units at bedtime  Also continue:  - Pravastatin 20 mg daily  Please return in 4 months with your sugar log.   - we checked her HbA1c: 7%  - advised to check sugars at different times of the day - 1x a day, rotating check times - advised for yearly eye exams >> she is not UTD - return to clinic in 3-4 months   2. Obesity class 3 -Unfortunately, we cannot use a GLP-1 receptor agonist for her as  she developed pancreatitis while on Victoza.  She also had frequent urination on chlorthalidone in the past so we could not use SGLT2 inhibitors.  However, she then came off the diuretic.  However, before last visit she had a perineal infection. This resolved. -Before our visit from 04/2020, she gained 20 pounds and she started to be seen by Dr. Leafy Ro in the weight management clinic.  However,  she stopped going to the clinic so before last visit she gained 16 pounds in the previous 6 months -Before last visit she gained another 20 pounds.  At that time, she is restarted to go to the gym  3. HL Reviewed latest lipid panel from 12/2020: LDL remained above target, the rest of fractions were at goal: Lab Results  Component Value Date   CHOL 189 12/09/2020   HDL 49.10 12/09/2020   LDLCALC 124 (H) 12/09/2020   LDLDIRECT 156.0 06/13/2018   TRIG 82.0 12/09/2020   CHOLHDL 4 12/09/2020  -I did suggest pravastatin After she did not start it.  At last visit, after discussion about benefits, she did start 20 mg daily.  Philemon Kingdom, MD PhD [facility]

## 2021-07-25 LAB — HM DIABETES EYE EXAM

## 2021-07-28 ENCOUNTER — Other Ambulatory Visit: Payer: Self-pay | Admitting: Internal Medicine

## 2021-07-31 ENCOUNTER — Other Ambulatory Visit: Payer: Self-pay | Admitting: Internal Medicine

## 2021-07-31 DIAGNOSIS — Z794 Long term (current) use of insulin: Secondary | ICD-10-CM

## 2021-08-13 ENCOUNTER — Encounter: Payer: Self-pay | Admitting: Internal Medicine

## 2021-08-27 ENCOUNTER — Other Ambulatory Visit: Payer: Self-pay | Admitting: Family Medicine

## 2021-08-27 DIAGNOSIS — I10 Essential (primary) hypertension: Secondary | ICD-10-CM

## 2021-09-11 ENCOUNTER — Ambulatory Visit: Payer: Medicaid Other | Admitting: Internal Medicine

## 2021-09-16 ENCOUNTER — Encounter: Payer: Self-pay | Admitting: Family Medicine

## 2021-09-17 ENCOUNTER — Encounter: Payer: Self-pay | Admitting: Family Medicine

## 2021-09-17 ENCOUNTER — Telehealth (INDEPENDENT_AMBULATORY_CARE_PROVIDER_SITE_OTHER): Payer: Medicaid Other | Admitting: Family Medicine

## 2021-09-17 DIAGNOSIS — E1169 Type 2 diabetes mellitus with other specified complication: Secondary | ICD-10-CM

## 2021-09-17 DIAGNOSIS — U071 COVID-19: Secondary | ICD-10-CM | POA: Diagnosis not present

## 2021-09-17 DIAGNOSIS — Z794 Long term (current) use of insulin: Secondary | ICD-10-CM | POA: Diagnosis not present

## 2021-09-17 DIAGNOSIS — G43009 Migraine without aura, not intractable, without status migrainosus: Secondary | ICD-10-CM

## 2021-09-17 MED ORDER — RIZATRIPTAN BENZOATE 5 MG PO TABS: 5.0000 mg | ORAL_TABLET | ORAL | 3 refills | Status: DC | PRN

## 2021-09-17 NOTE — Progress Notes (Signed)
Virtual Visit via Video Note Call initiated visit started off as video however switched to telephone as patient's audio went in and out.  I connected with Rachel Dawson on 09/17/21 at  4:00 PM EST by a video enabled telemedicine application 2/2 [id number] pandemic and verified that I am speaking with the correct person using two identifiers.  Location patient: home Location provider:work or home office Persons participating in the virtual visit: patient, provider  I discussed the limitations of evaluation and management by telemedicine and the availability of in person appointments. The patient expressed understanding and agreed to proceed. Chief Complaint  Patient presents with   Telehealth Consent    VIDEO VISIT- Refill for Maxalt needed and Vit D.     HPI: States symptoms started that Monday 09/08/21 with a HA, felt stuffy, started coughing.  Pt also felt clammy, sweaty, and "not right".   Pt states she tested positive for COVID last Tuesday 1/31 at [facility]. Pt states she feels bad.  States chest has been bothering her.  Endorses productive cough and a HA.  Having loose stools (1-2/day).  Pt states bs have been 70-200s.  Has an an appt with Endo next wk.  Still trying to take medications. Tried hot tea with lemon and garlic, ibuprofen, vicks vapor rub.  Pt now working a day shift at the hospital.  Requesting a refill on Maxalt.  Notes increase in headaches a few days prior to start of current symptoms.  States taking ibuprofen, excedrin, and other OTC meds.  ROS: See pertinent positives and negatives per HPI.  Past Medical History:  Diagnosis Date   Anemia    Fatty liver    History of blood transfusion    "when I had tonsils taken out & w/hyster"   Hypercholesterolemia    Hypertension    Menometrorrhagia    Microcytic anemia    Type II diabetes mellitus ([place])    Vitamin D deficiency     Past Surgical History:  Procedure Laterality Date   ABDOMINAL HYSTERECTOMY  ~ 2012    [place]; 2004; 2006   HIP PINNING Bilateral ~ 1991   "balls had dropped out of their sockets"   TONSILLECTOMY AND ADENOIDECTOMY     TUBAL LIGATION  2006    Family History  Adopted: Yes  Problem Relation Age of Onset   Hypertension Daughter       Current Outpatient Medications:    Accu-Chek Softclix Lancets lancets, USE AS DIRECTED, Disp: 100 each, Rfl: 0   BD PEN NEEDLE NANO 2ND GEN 32G X 4 MM MISC, USE ONE NEEDLE TWICE A DAY, Disp: 100 each, Rfl: 0   blood glucose meter kit and supplies KIT, Dispense based on patient and insurance preference.  ICD-9 250.00, 250.01., Disp: 1 each, Rfl: 0   Blood Pressure Monitoring (BLOOD PRESSURE MONITOR/L CUFF) MISC, Check your blood pressure at the same time each day., Disp: 1 each, Rfl: 0   buPROPion (WELLBUTRIN SR) 150 MG 12 hr tablet, Take 1 tablet (150 mg total) by mouth daily., Disp: 30 tablet, Rfl: 0   glucose blood (ACCU-CHEK AVIVA PLUS) test strip, Use to check blood sugar twice a day., Disp: 100 each, Rfl: 11   hydrOXYzine (ATARAX/VISTARIL) 50 MG tablet, Take 1 tablet (50 mg total) by mouth 3 (three) times daily as needed for anxiety., Disp: 60 tablet, Rfl: 3   irbesartan (AVAPRO) 75 MG tablet, TAKE 1 TABLET BY MOUTH EVERY DAY, Disp: 90 tablet, Rfl: 1  metFORMIN (GLUCOPHAGE-XR) 500 MG 24 hr tablet, TAKE 2 TABLETS (1,000 MG TOTAL) BY MOUTH 2 (TWO) TIMES DAILY. GIVE W/FOOD., Disp: 360 tablet, Rfl: 1   naproxen (NAPROSYN) 500 MG tablet, Take 1 tablet (500 mg total) by mouth 2 (two) times daily., Disp: 30 tablet, Rfl: 0   pravastatin (PRAVACHOL) 20 MG tablet, Take 1 tablet (20 mg total) by mouth daily., Disp: 90 tablet, Rfl: 3   rizatriptan (MAXALT) 5 MG tablet, Take 1 tablet (5 mg total) by mouth as needed for migraine. May repeat in 2 hours if needed, Disp: 10 tablet, Rfl: 0   TRESIBA FLEXTOUCH 200 UNIT/ML FlexTouch Pen, INJECT 28 UNITS INTO THE SKIN DAILY., Disp: 9 mL, Rfl: 3   Vitamin D, Ergocalciferol, (DRISDOL) 1.25 MG  (50000 UNIT) CAPS capsule, Take 1 capsule (50,000 Units total) by mouth every 7 (seven) days., Disp: 12 capsule, Rfl: 0  EXAM:  VITALS per patient if applicable:  RR between 12-20 bpm  GENERAL: alert, oriented, appears well and in no acute distress  HEENT: atraumatic, conjunctiva clear, no obvious abnormalities on inspection of external nose and ears  NECK: normal movements of the head and neck  LUNGS: on inspection no signs of respiratory distress, breathing rate appears normal, no obvious gross SOB, gasping or wheezing  CV: no obvious cyanosis  MS: moves all visible extremities without noticeable abnormality  PSYCH/NEURO: pleasant and cooperative, no obvious depression or anxiety, speech and thought processing grossly intact  ASSESSMENT AND PLAN:  Discussed the following assessment and plan:  COVID-19 virus infection -Continued symptoms -Positive COVID test on 09/09/2021 with symptoms starting 08/12/2021. -Continue treatment of symptoms including OTC cough/cold medications for people with high blood pressure and DM, antihistamines, rest, hydration, steam from shower, gargling with warm salt water Chloraseptic spray, etc. -Patient is outside of window for antiviral medications. -Given continued productive cough with subjective chills/clamming sensation will obtain CXR to evaluate for PNA. -Given strict precautions - Plan: DG Chest 2 View  Migraine without aura and without status migrainosus, not intractable  - Plan: rizatriptan (MAXALT) 5 MG tablet  Type 2 diabetes mellitus with other specified complication, with long-term current use of insulin (HCC) -Labile blood sugars 2/2 current illness -Discussed using OTC cough/cold medications for people with HTN and DM such as diabetic tussin. -Continue monitoring blood sugars daily. -Hydration encouraged -Continue current medications -Encouraged to keep follow-up with endocrinology next week  F/u prn  I discussed the assessment  and treatment plan with the patient. The patient was provided an opportunity to ask questions and all were answered. The patient agreed with the plan and demonstrated an understanding of the instructions.   The patient was advised to call back or seek an in-person evaluation if the symptoms worsen or if the condition fails to improve as anticipated.  I provided 25 minutes of non-face-to-face time via phone during this encounter as call initially started via video however patient's audio kept going in and out.  Billie Ruddy, MD

## 2021-09-20 ENCOUNTER — Ambulatory Visit (HOSPITAL_BASED_OUTPATIENT_CLINIC_OR_DEPARTMENT_OTHER)
Admission: RE | Admit: 2021-09-20 | Discharge: 2021-09-20 | Disposition: A | Discharge status: Home / Self Care | Payer: Medicaid Other | Source: Ambulatory Visit | Attending: Family Medicine | Admitting: Family Medicine

## 2021-09-20 DIAGNOSIS — R079 Chest pain, unspecified: Secondary | ICD-10-CM | POA: Diagnosis not present

## 2021-09-20 DIAGNOSIS — U071 COVID-19: Secondary | ICD-10-CM

## 2021-09-24 ENCOUNTER — Encounter: Payer: Self-pay | Admitting: Internal Medicine

## 2021-09-24 ENCOUNTER — Ambulatory Visit (INDEPENDENT_AMBULATORY_CARE_PROVIDER_SITE_OTHER): Payer: Medicaid Other | Admitting: Internal Medicine

## 2021-09-24 ENCOUNTER — Other Ambulatory Visit: Payer: Self-pay

## 2021-09-24 ENCOUNTER — Telehealth: Payer: Self-pay

## 2021-09-24 ENCOUNTER — Other Ambulatory Visit (HOSPITAL_COMMUNITY): Payer: Self-pay

## 2021-09-24 VITALS — BP 130/80 | HR 67 | Ht 66.5 in | Wt 315.6 lb

## 2021-09-24 DIAGNOSIS — E785 Hyperlipidemia, unspecified: Secondary | ICD-10-CM | POA: Diagnosis not present

## 2021-09-24 DIAGNOSIS — E1165 Type 2 diabetes mellitus with hyperglycemia: Secondary | ICD-10-CM

## 2021-09-24 DIAGNOSIS — Z6841 Body Mass Index (BMI) 40.0 and over, adult: Secondary | ICD-10-CM | POA: Diagnosis not present

## 2021-09-24 DIAGNOSIS — Z794 Long term (current) use of insulin: Secondary | ICD-10-CM

## 2021-09-24 LAB — COMPREHENSIVE METABOLIC PANEL
ALT: 10 U/L (ref 0–35)
AST: 10 U/L (ref 0–37)
Albumin: 4.4 g/dL (ref 3.5–5.2)
Alkaline Phosphatase: 78 U/L (ref 39–117)
BUN: 13 mg/dL (ref 6–23)
CO2: 29 mEq/L (ref 19–32)
Calcium: 9.3 mg/dL (ref 8.4–10.5)
Chloride: 104 mEq/L (ref 96–112)
Creatinine, Ser: 0.83 mg/dL (ref 0.40–1.20)
GFR: 86.77 mL/min (ref >60.00)
Glucose, Bld: 88 mg/dL (ref 70–99)
Potassium: 4.2 mEq/L (ref 3.5–5.1)
Sodium: 140 mEq/L (ref 135–145)
Total Bilirubin: 0.4 mg/dL (ref 0.2–1.2)
Total Protein: 8 g/dL (ref 6.0–8.3)

## 2021-09-24 LAB — LIPID PANEL
Cholesterol: 203 mg/dL — ABNORMAL HIGH (ref 0–200)
HDL: 48.8 mg/dL (ref >39.00)
LDL Cholesterol: 134 mg/dL — ABNORMAL HIGH (ref 0–99)
NonHDL: 154.39
Total CHOL/HDL Ratio: 4
Triglycerides: 101 mg/dL (ref 0.0–149.0)
VLDL: 20.2 mg/dL (ref 0.0–40.0)

## 2021-09-24 LAB — MICROALBUMIN / CREATININE URINE RATIO
Creatinine,U: 143.1 mg/dL
Microalb Creat Ratio: 1.3 mg/g (ref 0.0–30.0)
Microalb, Ur: 1.8 mg/dL (ref 0.0–1.9)

## 2021-09-24 MED ORDER — FREESTYLE LIBRE 2 READER DEVI: 1.0000 | Freq: Every day | 0 refills | Status: AC

## 2021-09-24 MED ORDER — FREESTYLE LIBRE 2 SENSOR MISC: 1.0000 | 3 refills | Status: DC

## 2021-09-24 MED ORDER — TRESIBA FLEXTOUCH 200 UNIT/ML ~~LOC~~ SOPN: PEN_INJECTOR | SUBCUTANEOUS | 3 refills | Status: DC

## 2021-09-24 NOTE — Telephone Encounter (Signed)
Patient Advocate Encounter   Received notification from [facility] that prior authorization for Freestyle Libre 2 sensors is required by his/her insurance [employer].   PA submitted on 09/24/21  Key#: [id number]  Status is pending    [facility] will continue to follow:  Patient Advocate Fax: [phone]

## 2021-09-24 NOTE — Patient Instructions (Addendum)
Please continue: - Metformin ER 1000 mg 2x a day with meals - Tresiba 28 units at bedtime  Continue pravastatin 20 mg daily.  Please stop at the lab.  Try to start the Freestyle Libre 2 CGM.  Please return in 6 months.

## 2021-09-24 NOTE — Progress Notes (Signed)
Patient ID: Rachel Dawson, female   DOB: July 15, 1981, 41 y.o.   MRN: [id number]   This visit occurred during the SARS-CoV-2 public health emergency.  Safety protocols were in place, including screening questions prior to the visit, additional usage of staff PPE, and extensive cleaning of exam room while observing appropriate contact time as indicated for disinfecting solutions.   HPI: Rachel Dawson is a 41 y.o.-year-old female, presenting for follow-up for DM2, dx in 2014, insulin-dependent, uncontrolled, without long term complications (but with hyperglycemia, yeast infections).  Last visit 7 months ago. Interim history: She had COVID-19 last month. CXR showed no PNA. She still has fatigue. She started day shift 3 weeks ago. She started to go to the gym before Covid >> plans to restart.  Last hemoglobin A1c was: Lab Results  Component Value Date   HGBA1C 6.8 (H) 12/09/2020   HGBA1C 7.8 (A) 04/30/2020   HGBA1C 7.3 (H) 11/02/2019  04/30/2020: HbA1c calculated from fructosamine is 6.6%, slightly better than before 04/14/2020: HbA1c 6.7% 09/23/2018: HbA1c calculated from fructosamine is 8.2%, lower than the measured one  but higher than before. 06/13/2018: HbA1c from fructosamine: 7.44%  Pt is on a regimen of: - Metformin ER 1000 mg 2x a day with meals -  >> Tresiba 28 units at bedtime She was on Victoza >> pancreatitis. She had nausea and diarrhea from regular metformin. She was on glimepiride 4 mg before dinner but ran out 08/2018 her last visit and we did not restart.   Pt checks her sugars twice a day: - am: 90-110 >> 109-115 >> (before her dinner): 130s >> 89, 115-120 - 2h after b'fast: n/c >> 90-143, 164 >> 125-130 >> n/c - before lunch: n/c >> her lunch - 2h after lunch: n/c >> 100-139 >> n/c - before dinner: 96-129, 133 >> 110 >> (waking up): 125-146 >> n/c - 2h after dinner: n/c >> 114-157 >> 138-140 >> n/c >> 110-120 - bedtime: n/c - nighttime: n/c Lowest sugar was 79 >> 98 >>  125 >> 89; it is unclear at which level she has hypoglycemia awareness. Highest sugar was 164 >> 220 >> 146 >> 200s (Covid).  Glucometer:  One Touch Ultra  She is seen in the [facility].  She eats a low-carb diet.  I advised her to stop juice at last visit.  She is now only drinking occasional diet sodas.    -No CKD, last BUN/creatinine:  04/18/2020: Glucose 220, BUN/creatinine 9/0.87, GFR >90 Lab Results  Component Value Date   BUN 21 11/02/2019   BUN 19 12/21/2018   CREATININE 0.86 11/02/2019   CREATININE 0.97 12/21/2018   -+ HL; last set of lipids: Lab Results  Component Value Date   CHOL 189 12/09/2020   HDL 49.10 12/09/2020   LDLCALC 124 (H) 12/09/2020   LDLDIRECT 156.0 06/13/2018   TRIG 82.0 12/09/2020   CHOLHDL 4 12/09/2020  At last visit I again recommended pravastatin 20 mg daily.  She was able to start.  - last eye exam was in 07/2021: No DR  - no numbness and tingling in her feet.  Latest foot exam was on 12/09/2020.  Pt has FH of DM in mother (?)  -She is adopted.  ROS: + see HPI  I reviewed pt's medications, allergies, PMH, social hx, family hx, and changes were documented in the history of present illness. Otherwise, unchanged from my initial visit note.  Past Medical History:  Diagnosis Date   Anemia    Fatty liver  History of blood transfusion    "when I had tonsils taken out & w/hyster"   Hypercholesterolemia    Hypertension    Menometrorrhagia    Microcytic anemia    Type II diabetes mellitus ([place])    Vitamin D deficiency    Past Surgical History:  Procedure Laterality Date   ABDOMINAL HYSTERECTOMY  ~ 2012   [place]; 2004; 2006   HIP PINNING Bilateral ~ 1991   "balls had dropped out of their sockets"   [place]     TUBAL LIGATION  2006   Social History   Social History   Marital status: Single    Spouse name: N/A   Number of children: 2    Occupational History   Lab tech    Social History Main Topics   Smoking status: Former Smoker    Packs/day: 0.12 - 2 cigs a day    Years: 4.00    Types: Cigarettes    Quit date: 07/10/2014   Smokeless tobacco: Never Used   Alcohol use Yes     Comment: 11/22/2014 "might drink a little a couple times/month, if that"   Drug use: No   Sexual activity: Not Currently    Birth control/ protection: Surgical   Current Outpatient Medications on File Prior to Visit  Medication Sig Dispense Refill   Accu-Chek Softclix Lancets lancets USE AS DIRECTED 100 each 0   BD PEN NEEDLE NANO 2ND GEN 32G X 4 MM MISC USE ONE NEEDLE TWICE A DAY 100 each 0   blood glucose meter kit and supplies KIT Dispense based on patient and insurance preference.  ICD-9 250.00, 250.01. 1 each 0   Blood Pressure Monitoring (BLOOD PRESSURE MONITOR/L CUFF) MISC Check your blood pressure at the same time each day. 1 each 0   buPROPion (WELLBUTRIN SR) 150 MG 12 hr tablet Take 1 tablet (150 mg total) by mouth daily. 30 tablet 0   glucose blood (ACCU-CHEK AVIVA PLUS) test strip Use to check blood sugar twice a day. 100 each 11   hydrOXYzine (ATARAX/VISTARIL) 50 MG tablet Take 1 tablet (50 mg total) by mouth 3 (three) times daily as needed for anxiety. 60 tablet 3   irbesartan (AVAPRO) 75 MG tablet TAKE 1 TABLET BY MOUTH EVERY DAY 90 tablet 1   metFORMIN (GLUCOPHAGE-XR) 500 MG 24 hr tablet TAKE 2 TABLETS (1,000 MG TOTAL) BY MOUTH 2 (TWO) TIMES DAILY. GIVE W/FOOD. 360 tablet 1   naproxen (NAPROSYN) 500 MG tablet Take 1 tablet (500 mg total) by mouth 2 (two) times daily. 30 tablet 0   pravastatin (PRAVACHOL) 20 MG tablet Take 1 tablet (20 mg total) by mouth daily. 90 tablet 3   rizatriptan (MAXALT) 5 MG tablet Take 1 tablet (5 mg total) by mouth as needed for migraine. May repeat in 2 hours if needed 10 tablet 3   TRESIBA FLEXTOUCH 200 UNIT/ML FlexTouch Pen INJECT 28 UNITS INTO THE SKIN DAILY. 9 mL 3   Vitamin D, Ergocalciferol, (DRISDOL) 1.25 MG (50000 UNIT) CAPS  capsule Take 1 capsule (50,000 Units total) by mouth every 7 (seven) days. 12 capsule 0   [DISCONTINUED] ferrous fumarate (HEMOCYTE - 106 MG FE) 325 (106 FE) MG TABS Take 1 tablet by mouth daily.       [DISCONTINUED] medroxyPROGESTERone (PROVERA) 10 MG tablet Take 10 mg by mouth daily.       No current facility-administered medications on file prior to visit.   Allergies  Allergen Reactions  Latex Rash   Family History  Adopted: Yes  Problem Relation Age of Onset   Hypertension Daughter     PE: BP 130/80 (BP Location: Right Arm, Patient Position: Sitting, Cuff Size: Normal)    Pulse 67    Ht 5' 6.5" (1.689 m)    Wt (!) 315 lb 9.6 oz (143.2 kg)    LMP 01/05/2011 (Exact Date)    SpO2 99%    BMI 50.18 kg/m  Wt Readings from Last 3 Encounters:  09/24/21 (!) 315 lb 9.6 oz (143.2 kg)  03/10/21 (!) 308 lb 3.2 oz (139.8 kg)  01/27/21 (!) 309 lb 6.4 oz (140.3 kg)   Constitutional: overweight, in NAD Eyes: PERRLA, EOMI, no exophthalmos ENT: moist mucous membranes, no thyromegaly, no cervical lymphadenopathy Cardiovascular: RRR, No MRG Respiratory: CTA B Musculoskeletal: no deformities, strength intact in all 4 Skin: moist, warm, no rashes Neurological: no tremor with outstretched hands, DTR normal in all 4  ASSESSMENT: 1. DM2, insulin-dependent, uncontrolled, without long term complications, but with hyperglycemia  2. Obesity BMI Classification: < 18.5 underweight  18.5-24.9 normal weight  25.0-29.9 overweight  30.0-34.9 class I obesity  35.0-39.9 class II obesity  ? 40.0 class III obesity   3. HL  PLAN:  1. Patient with history of uncontrolled type 2 diabetes, on oral antidiabetic regimen with metformin and long-acting insulin, returning after another long absence of 7 months.  Previous visit was 9 months prior.  She is not usually compliant with the recommended follow-up intervals.  At last visit, she was back on night shift, after.  When she works day shift and her diabetes  started to improve.  However, she did start going to the gym and she was planning to continue this.  She was thinking about going back to dayshift.  At last visit, sugars are mainly at goal, with few exceptions.  She was working on her diet.  HbA1c was 6.8%, lower. -At today's visit, she returns after having had COVID-19.  Sugars were slightly higher during the episode.  She is only now starting to feel a little better.  3 weeks ago she switched to dayshift.  She feels that this schedule works better for her, and she can exercise and pay more attention to her diet while working days.  As of now, she is not exercising since she is not back to baseline after her COVID infection. -Reviewing her blood sugars, they appear to be at goal throughout the day.  We will not change her regimen today.  I refilled her Tyler Aas dose. -She tells me that her One Touch ultra glucometer is giving her errors.  I suggested a freestyle libre 2 CGM, for which I sent a prescription to her pharmacy.  If this is not covered, I advised her to let me know, in which case, we will send a prescription for the Accu-Chek Aviva glucometer.  I believe that this is covered by Medicaid. - I suggested to:  Patient Instructions  Please continue: - Metformin ER 1000 mg 2x a day with meals - Tresiba 28 units at bedtime  Continue pravastatin 20 mg daily.  Please stop at the lab.  Try to start the Freestyle Libre 2 CGM.  Please return in 6 months.  - we checked her HbA1c: 6.8% (stable, at goal) - advised to check sugars at different times of the day - 1x a day, rotating check times - advised for yearly eye exams >> she is UTD - will check annual labs today -  UTD with foot exam - return to clinic in 6 months  2. Obesity class 3 -Unfortunately, we cannot use a GLP-1 receptor agonist for her as she developed pancreatitis while on Victoza.   -She previously had increased urination on chlorthalidone and also perineal infection, so we did  not start SGLT2 inhibitors daily -She gained almost 40 pounds before the last 2 visits combined -Since last visit, she gained 6 pounds -Before last visit, she started to go to the gym.  However, she stopped afterwards and she restarted last month.  However she developed COVID-19 soon after so she is now taking a break from exercising.  She plans to go back when she feels better.  3. HL -Reviewed latest lipid panel from 12/2020: LDL remains above target, the rest of fractions were at goal: Lab Results  Component Value Date   CHOL 189 12/09/2020   HDL 49.10 12/09/2020   LDLCALC 124 (H) 12/09/2020   LDLDIRECT 156.0 06/13/2018   TRIG 82.0 12/09/2020   CHOLHDL 4 12/09/2020  -At last visit, she agreed to try pravastatin 20 mg daily >> She started -tolerated well -will check a lipid panel today-fasting  Component     Latest Ref Rng & Units 09/24/2021  Sodium     135 - 145 mEq/L 140  Potassium     3.5 - 5.1 mEq/L 4.2  Chloride     96 - 112 mEq/L 104  CO2     19 - 32 mEq/L 29  Glucose     70 - 99 mg/dL 88  BUN     6 - 23 mg/dL 13  Creatinine     0.40 - 1.20 mg/dL 0.83  Total Bilirubin     0.2 - 1.2 mg/dL 0.4  Alkaline Phosphatase     39 - 117 U/L 78  AST     0 - 37 U/L 10  ALT     0 - 35 U/L 10  Total Protein     6.0 - 8.3 g/dL 8.0  Albumin     3.5 - 5.2 g/dL 4.4  Calcium     8.4 - 10.5 mg/dL 9.3  GFR     >60.00 mL/min 86.77  Cholesterol     0 - 200 mg/dL 203 (H)  Triglycerides     0.0 - 149.0 mg/dL 101.0  HDL Cholesterol     >39.00 mg/dL 48.80  VLDL     0.0 - 40.0 mg/dL 20.2  LDL (calc)     0 - 99 mg/dL 134 (H)  Total CHOL/HDL Ratio      4  NonHDL      154.39  Microalb, Ur     0.0 - 1.9 mg/dL 1.8  Creatinine,U     mg/dL 143.1  MICROALB/CREAT RATIO     0.0 - 30.0 mg/g 1.3  Labs are at goal with the exception of a high LDL.  This has increased from before.  Will advise her to increase pravastatin to 40 mg daily.  Philemon Kingdom, MD PhD [facility]  Endocrinology

## 2021-09-25 MED ORDER — PRAVASTATIN SODIUM 40 MG PO TABS
40.0000 mg | ORAL_TABLET | Freq: Every day | ORAL | 3 refills | Status: DC
Start: 2021-09-25 — End: 2023-12-09

## 2021-09-30 ENCOUNTER — Other Ambulatory Visit: Payer: Self-pay | Admitting: Internal Medicine

## 2021-09-30 DIAGNOSIS — Z794 Long term (current) use of insulin: Secondary | ICD-10-CM

## 2021-09-30 DIAGNOSIS — E1165 Type 2 diabetes mellitus with hyperglycemia: Secondary | ICD-10-CM

## 2021-10-01 ENCOUNTER — Telehealth: Payer: Self-pay | Admitting: Pharmacy Technician

## 2021-10-01 NOTE — Telephone Encounter (Signed)
Patient Advocate Encounter  Received notification from [facility]) that prior authorization for TRESIBA 200U is required.   PA approved under previous [id number] Key [id number] Status is pending   [facility] will continue to follow  AUTHORIZED [place] 12.22.2023  Luciano Cutter, CPhT Patient Advocate [facility] Phone: [phone] Fax:  [phone]

## 2021-10-15 ENCOUNTER — Telehealth: Payer: Self-pay

## 2021-10-15 NOTE — Telephone Encounter (Signed)
Last OV June 2022, PCP request pt to return to f/u on HTN in 4mo Pt had VV  09/17/21 for post covid, migraine.  ? ?Pt notified of above. F/u appt scheduled; CPE scheduled. ?

## 2021-10-23 ENCOUNTER — Other Ambulatory Visit (HOSPITAL_COMMUNITY): Payer: Self-pay

## 2021-10-23 NOTE — Telephone Encounter (Signed)
Rachel Dawson, can you please advise the patient to split his Tyler Aas into 2 equal doses a day (at least for a period of time)? Claiborne Billings, can we resubmit with this information? ?

## 2021-10-23 NOTE — Telephone Encounter (Signed)
Patient Advocate Encounter ? ?Received notification from [employer] that the request for prior authorization for [employer] 2 sensors has been denied due to one of the following: ?(A) You require two or more insulin injections daily. ?(B) You are using an external insulin pump. ? ?[facility]ty Pharmacy Patient Advocate ?Fax: [phone]  ?

## 2021-10-24 ENCOUNTER — Other Ambulatory Visit (HOSPITAL_COMMUNITY): Payer: Self-pay

## 2021-10-24 ENCOUNTER — Telehealth: Payer: Self-pay

## 2021-10-24 MED ORDER — TRESIBA FLEXTOUCH 200 UNIT/ML ~~LOC~~ SOPN: 14.0000 [IU] | PEN_INJECTOR | Freq: Two times a day (BID) | SUBCUTANEOUS | 3 refills | Status: DC

## 2021-10-24 NOTE — Telephone Encounter (Signed)
Patient Advocate Encounter ? ?Prior Authorization for [employer] 2 Sensors has been approved.   ? ?PA# [id number] ? ?Effective dates: 10/24/21 through 04/26/22 ? ?Per Test Claim Patients co-pay is $0.  ? ?Spoke with Pharmacy to Process. ? ?Patient Advocate ?Fax: [phone]  ?

## 2021-10-24 NOTE — Addendum Note (Signed)
Addended by: Lauralyn Primes on: 10/24/2021 08:30 AM ? ? Modules accepted: Orders ? ?

## 2021-10-24 NOTE — Telephone Encounter (Signed)
Patient Advocate Encounter ?  ?Received notification from patient calls that prior authorization for [facility] 2 Sensors is required by his/her insurance [employer]. ?  ?PA submitted on 10/24/21 ? ?Key#: [id number] ? ?Status is pending ?   ?[facility] will continue to follow: ? ?Patient Advocate ?Fax: [phone]  ?

## 2021-10-24 NOTE — Telephone Encounter (Addendum)
Pt contacted and advised to split Tresiba injection into 2. Pt confirmed she was taking 1 injection of 28 units. She will now split that into 14 units twice daily. Rx updated ?

## 2021-11-05 ENCOUNTER — Ambulatory Visit (INDEPENDENT_AMBULATORY_CARE_PROVIDER_SITE_OTHER): Payer: Medicaid Other | Admitting: Family Medicine

## 2021-11-05 VITALS — BP 154/70 | HR 68 | Temp 99.1°F | Wt 319.8 lb

## 2021-11-05 DIAGNOSIS — Z794 Long term (current) use of insulin: Secondary | ICD-10-CM

## 2021-11-05 DIAGNOSIS — I10 Essential (primary) hypertension: Secondary | ICD-10-CM

## 2021-11-05 DIAGNOSIS — E1169 Type 2 diabetes mellitus with other specified complication: Secondary | ICD-10-CM

## 2021-11-05 NOTE — Progress Notes (Signed)
Subjective:  ? ? Patient ID: Rachel Dawson, female    DOB: Feb 20, 1981, 41 y.o.   MRN: [id number] ? ?Chief Complaint  ?Patient presents with  ? Follow-up  ?  BP  ? ? ?HPI ?Patient was seen today for f/u on HTN.  Patient took blood pressure medicine 10 minutes prior to appointment.  Taking Avapro 75 mg daily.  Pt trying to exercise regularly, but states it is difficult as she works 2 jobs, is in school, and has no days off.  Has a membership to [facility] and [facility] [employer].  Patient drinking more water.  Pt's daughter preparing meals for her, but will be graduating in a few months.  Pt followed by Endo, Dr. Cruzita Lederer.  Taking Tresiba 14 units BID and metformin 1000 mg BID.  States blood sugar typically 88 in the morning, but notes A1c does not reflect this. ? ?Pt's daughter plans to attend [employer] in the fall.  Patient's son will be a rising senior high school.  He currently plays football. ? ?Past Medical History:  ?Diagnosis Date  ? Anemia   ? Fatty liver   ? History of blood transfusion   ? "when I had tonsils taken out & w/hyster"  ? Hypercholesterolemia   ? Hypertension   ? Menometrorrhagia   ? Microcytic anemia   ? Type II diabetes mellitus ([place])   ? Vitamin D deficiency   ? ? ?Allergies  ?Allergen Reactions  ? Latex Rash  ? ? ?ROS ?General: Denies fever, chills, night sweats, changes in weight, changes in appetite ?HEENT: Denies headaches, ear pain, changes in vision, rhinorrhea, sore throat ?CV: Denies CP, palpitations, SOB, orthopnea ?Pulm: Denies SOB, cough, wheezing ?GI: Denies abdominal pain, nausea, vomiting, diarrhea, constipation ?GU: Denies dysuria, hematuria, frequency, vaginal discharge ?Msk: Denies muscle cramps, joint pains ?Neuro: Denies weakness, numbness, tingling ?Skin: Denies rashes, bruising ?Psych: Denies depression, anxiety, hallucinations ? ?   ?Objective:  ?  ?Blood pressure (!) 154/70, pulse 68, temperature 99.1 ?F (37.3 ?C), temperature source Oral, weight (!) 319 lb 12.8 oz (145.1 kg), last  menstrual period 01/05/2011, SpO2 96 %. ? ?Gen. Pleasant, well-nourished, in no distress, normal affect   ?HEENT: /AT, face symmetric, conjunctiva clear, no scleral icterus, PERRLA, EOMI, nares patent without drainage ?Lungs: no accessory muscle use, CTAB, no wheezes or rales ?Cardiovascular: RRR, no m/r/g, no peripheral edema ?Musculoskeletal: No deformities, no cyanosis or clubbing, normal tone ?Neuro:  A&Ox3, CN II-XII intact, normal gait ?Skin:  Warm, no lesions/ rash ? ? ?Wt Readings from Last 3 Encounters:  ?11/05/21 (!) 319 lb 12.8 oz (145.1 kg)  ?09/24/21 (!) 315 lb 9.6 oz (143.2 kg)  ?03/10/21 (!) 308 lb 3.2 oz (139.8 kg)  ? ? ?Lab Results  ?Component Value Date  ? WBC 7.1 11/02/2019  ? HGB 12.6 11/02/2019  ? HCT 39.3 11/02/2019  ? PLT 309 11/02/2019  ? GLUCOSE 88 09/24/2021  ? CHOL 203 (H) 09/24/2021  ? TRIG 101.0 09/24/2021  ? HDL 48.80 09/24/2021  ? LDLDIRECT 156.0 06/13/2018  ? LDLCALC 134 (H) 09/24/2021  ? ALT 10 09/24/2021  ? AST 10 09/24/2021  ? NA 140 09/24/2021  ? K 4.2 09/24/2021  ? CL 104 09/24/2021  ? CREATININE 0.83 09/24/2021  ? BUN 13 09/24/2021  ? CO2 29 09/24/2021  ? TSH 1.40 12/09/2020  ? INR 1.18 11/22/2014  ? HGBA1C 6.8 (H) 12/09/2020  ? MICROALBUR 1.8 09/24/2021  ? ? ?Assessment/Plan: ? ?Essential hypertension ?-Elevated ?-Recheck ?-Discussed increasing medication.  Patient declines. ?-Discussed the importance of lifestyle modifications ?-Continue irbesartan 75 mg daily ?-Patient encouraged to obtain BP cuff to monitor BP at home.  For consistent elevation greater than 140/90 increase irbesartan to 150 mg daily. ? ?Type 2 diabetes mellitus with other specified complication, with long-term current use of insulin ([place]) ?-Hemoglobin A1c 6.8% on 12/09/2020 ?-Would expect lower hemoglobin A1c as patient with a.m. BS readings 88.  Consider evaluation for abnormality of hemoglobin such as thalassemia. ?-Consider obtaining fructosamine ?-Continue Tresiba 14 units twice daily and metformin 1000  mg twice daily ?-Continue ARB and statin ?-Eye exam done 07/25/2021 ?-Foot exam done 12/09/2020. ? ?Morbid obesity ([place]) ?-Body mass index is 50.84 kg/m?. ?-Lifestyle modifications encouraged ?-Consider GLP-1 inhibitor for blood sugar and weight loss. ?-Consider weight management referral ? ?F/u prn in 3-4 months for HTN. ? ?Grier Mitts, MD ?

## 2021-11-06 ENCOUNTER — Encounter: Payer: Self-pay | Admitting: Family Medicine

## 2021-12-10 ENCOUNTER — Ambulatory Visit (INDEPENDENT_AMBULATORY_CARE_PROVIDER_SITE_OTHER): Payer: Medicaid Other | Admitting: Family Medicine

## 2021-12-10 VITALS — BP 191/97 | HR 65 | Temp 98.4°F | Ht 67.0 in | Wt 322.6 lb

## 2021-12-10 DIAGNOSIS — E559 Vitamin D deficiency, unspecified: Secondary | ICD-10-CM | POA: Diagnosis not present

## 2021-12-10 DIAGNOSIS — Z794 Long term (current) use of insulin: Secondary | ICD-10-CM | POA: Diagnosis not present

## 2021-12-10 DIAGNOSIS — E1169 Type 2 diabetes mellitus with other specified complication: Secondary | ICD-10-CM

## 2021-12-10 DIAGNOSIS — L680 Hirsutism: Secondary | ICD-10-CM | POA: Diagnosis not present

## 2021-12-10 DIAGNOSIS — Z0000 Encounter for general adult medical examination without abnormal findings: Secondary | ICD-10-CM

## 2021-12-10 DIAGNOSIS — R5383 Other fatigue: Secondary | ICD-10-CM

## 2021-12-10 DIAGNOSIS — E782 Mixed hyperlipidemia: Secondary | ICD-10-CM | POA: Diagnosis not present

## 2021-12-10 DIAGNOSIS — I10 Essential (primary) hypertension: Secondary | ICD-10-CM | POA: Diagnosis not present

## 2021-12-10 DIAGNOSIS — H6123 Impacted cerumen, bilateral: Secondary | ICD-10-CM

## 2021-12-10 LAB — CBC WITH DIFFERENTIAL/PLATELET
Basophils Absolute: 0 10*3/uL (ref 0.0–0.1)
Basophils Relative: 0.4 % (ref 0.0–3.0)
Eosinophils Absolute: 0.1 10*3/uL (ref 0.0–0.7)
Eosinophils Relative: 1.9 % (ref 0.0–5.0)
HCT: 39 % (ref 36.0–46.0)
Hemoglobin: 12.3 g/dL (ref 12.0–15.0)
Lymphocytes Relative: 26.8 % (ref 12.0–46.0)
Lymphs Abs: 1.6 10*3/uL (ref 0.7–4.0)
MCHC: 31.5 g/dL (ref 30.0–36.0)
MCV: 81.9 fl (ref 78.0–100.0)
Monocytes Absolute: 0.3 10*3/uL (ref 0.1–1.0)
Monocytes Relative: 5.3 % (ref 3.0–12.0)
Neutro Abs: 4 10*3/uL (ref 1.4–7.7)
Neutrophils Relative %: 65.6 % (ref 43.0–77.0)
Platelets: 325 10*3/uL (ref 150.0–400.0)
RBC: 4.77 Mil/uL (ref 3.87–5.11)
RDW: 14 % (ref 11.5–15.5)
WBC: 6.1 10*3/uL (ref 4.0–10.5)

## 2021-12-10 LAB — COMPREHENSIVE METABOLIC PANEL
ALT: 13 U/L (ref 0–35)
AST: 13 U/L (ref 0–37)
Albumin: 4.3 g/dL (ref 3.5–5.2)
Alkaline Phosphatase: 76 U/L (ref 39–117)
BUN: 15 mg/dL (ref 6–23)
CO2: 26 mEq/L (ref 19–32)
Calcium: 9.3 mg/dL (ref 8.4–10.5)
Chloride: 104 mEq/L (ref 96–112)
Creatinine, Ser: 0.87 mg/dL (ref 0.40–1.20)
GFR: 81.89 mL/min (ref >60.00)
Glucose, Bld: 127 mg/dL — ABNORMAL HIGH (ref 70–99)
Potassium: 3.9 mEq/L (ref 3.5–5.1)
Sodium: 137 mEq/L (ref 135–145)
Total Bilirubin: 0.4 mg/dL (ref 0.2–1.2)
Total Protein: 7.5 g/dL (ref 6.0–8.3)

## 2021-12-10 LAB — LIPID PANEL
Cholesterol: 190 mg/dL (ref 0–200)
HDL: 51.3 mg/dL (ref >39.00)
LDL Cholesterol: 123 mg/dL — ABNORMAL HIGH (ref 0–99)
NonHDL: 138.66
Total CHOL/HDL Ratio: 4
Triglycerides: 79 mg/dL (ref 0.0–149.0)
VLDL: 15.8 mg/dL (ref 0.0–40.0)

## 2021-12-10 LAB — TSH: TSH: 0.93 u[IU]/mL (ref 0.35–5.50)

## 2021-12-10 LAB — HEMOGLOBIN A1C: Hgb A1c MFr Bld: 7.4 % — ABNORMAL HIGH (ref 4.6–6.5)

## 2021-12-10 LAB — T4, FREE: Free T4: 0.97 ng/dL (ref 0.60–1.60)

## 2021-12-10 LAB — VITAMIN D 25 HYDROXY (VIT D DEFICIENCY, FRACTURES): VITD: 19.86 ng/mL — ABNORMAL LOW (ref 30.00–100.00)

## 2021-12-10 MED ORDER — VITAMIN D (ERGOCALCIFEROL) 1.25 MG (50000 UNIT) PO CAPS: 50000.0000 [IU] | ORAL_CAPSULE | ORAL | 0 refills | Status: DC

## 2021-12-10 MED ORDER — SPIRONOLACTONE 25 MG PO TABS: 12.5000 mg | ORAL_TABLET | Freq: Every day | ORAL | 1 refills | Status: DC

## 2021-12-10 NOTE — Patient Instructions (Signed)
Do not forget to schedule an appointment for your mammogram. ? ?

## 2021-12-10 NOTE — Progress Notes (Signed)
Subjective:  ?  ? Rachel Dawson is a 41 y.o. female and is here for a comprehensive physical exam. Pt followed by Endo, taking [place] and metformin.  A1C has been lower than anticipated in the past.  Pt denies hypoglycemia though may have had an episode Monday at work.  Pt states she forgot to put on continuous glucometer so unsure of her blood sugar at the time felt unwell.  Pt notes decreased energy.  Working 2 jobs with varying hours.  Pt continuing diet changes and trying to exercise.  Notes weight gain since last visit. ? ?Social History  ? ?Socioeconomic History  ? Marital status: Single  ?  Spouse name: Not on file  ? Number of children: 3  ? Years of education: Not on file  ? Highest education level: Associate degree: academic program  ?Occupational History  ? Occupation: [employer]  ?  Employer: [employer]  ?Tobacco Use  ? Smoking status: Former  ?  Packs/day: 0.12  ?  Years: 4.00  ?  Pack years: 0.48  ?  Types: Cigarettes  ?  Quit date: 07/10/2014  ?  Years since quitting: 7.4  ? Smokeless tobacco: Never  ?Vaping Use  ? Vaping Use: Never used  ?Substance and Sexual Activity  ? Alcohol use: Yes  ?  Comment: occ  ? Drug use: No  ? Sexual activity: Not Currently  ?  Birth control/protection: Surgical  ?Other Topics Concern  ? Not on file  ?Social History Narrative  ? Patient is right-handed. She is single. She lives in a single level home. She rarely drinks caffeine. She walks daily, and has recently began aeorbic exercises for 30 minutes daily.  ? ?Social Determinants of Health  ? ?Financial Resource Strain: Not on file  ?Food Insecurity: Not on file  ?Transportation Needs: Not on file  ?Physical Activity: Not on file  ?Stress: Not on file  ?Social Connections: Not on file  ?Intimate Partner Violence: Not on file  ? ?Health Maintenance  ?Topic Date Due  ? COVID-19 Vaccine (5 - Booster) 01/17/2021  ? HEMOGLOBIN A1C  06/11/2021  ? FOOT EXAM  12/09/2021  ? PAP SMEAR-Modifier  12/10/2022 (Originally 12/09/2018)   ? INFLUENZA VACCINE  03/10/2022  ? OPHTHALMOLOGY EXAM  07/25/2022  ? TETANUS/TDAP  07/01/2030  ? Hepatitis C Screening  Completed  ? HIV Screening  Completed  ? HPV VACCINES  Aged Out  ? ? ?The following portions of the patient's history were reviewed and updated as appropriate: allergies, current medications, past family history, past medical history, past social history, past surgical history, and problem list. ? ?Review of Systems ?Pertinent items noted in HPI and remainder of comprehensive ROS otherwise negative.  ? ?Objective:  ? ? BP (!) 191/97 (BP Location: Left Arm, Patient Position: Sitting, Cuff Size: Large)   Pulse 65   Temp 98.4 ?F (36.9 ?C) (Oral)   Ht '5\' 7"'$  (1.702 m)   Wt (!) 322 lb 9.6 oz (146.3 kg)   LMP 01/05/2011 (Exact Date)   SpO2 98%   BMI 50.53 kg/m?  ?General appearance: alert, cooperative, and no distress ?Head: Normocephalic, without obvious abnormality, atraumatic ?Eyes: conjunctivae/corneas clear. PERRL, EOM's intact. Fundi benign. ?Ears: Bilateral canals occluded with soft to medium consistency cerumen.  Bilateral ears irrigated however procedure stopped as patient felt dizzy. ?Nose: Nares normal. Septum midline. Mucosa normal. No drainage or sinus tenderness. ?Throat: lips, mucosa, and tongue normal; teeth and gums normal ?Neck: no adenopathy, no carotid bruit, no JVD,  supple, symmetrical, trachea midline, and thyroid not enlarged, symmetric, no tenderness/mass/nodules ?Lungs: clear to auscultation bilaterally ?Heart: regular rate and rhythm, S1, S2 normal, no murmur, click, rub or gallop ?Abdomen: soft, non-tender; bowel sounds normal; no masses,  no organomegaly ?Extremities: extremities normal, atraumatic, no cyanosis or edema ?Pulses: 2+ and symmetric ?Skin: Skin color, texture, turgor normal.  Hair noted on chin.  Acanthosis nigricans on posterior neck.  No rashes or lesions. ?Lymph nodes: Cervical, supraclavicular, and axillary nodes normal. ?Neurologic: Alert and oriented  X 3, normal strength and tone. Normal symmetric reflexes. Normal coordination and gait  ?  ?Assessment:  ? ? Healthy female exam.    ?  ?Diabetic Foot Exam - Simple   ?Simple Foot Form ?Diabetic Foot exam was performed with the following findings: Yes 12/10/2021 10:51 AM  ?Visual Inspection ?No deformities, no ulcerations, no other skin breakdown bilaterally: Yes ?Sensation Testing ?Intact to touch and monofilament testing bilaterally: Yes ?Pulse Check ?Posterior Tibialis and Dorsalis pulse intact bilaterally: Yes ?Comments ?  ? ? ?Plan:  ? ? Anticipatory guidance given including wearing seatbelts, smoke detectors in the home, increasing physical activity, increasing p.o. intake of water and vegetables. ?-labs ?-pt to schedule mammogram ?-colonoscopy not due yet 2/2 age. ?-immunizations reviewed. ?-given handout ?-next CPE in 1 yr ?See After Visit Summary for Counseling Recommendations  ? ?Essential hypertension  ?-Uncontrolled ?-BP recheck ?-Lifestyle modifications ?-Continue irbesartan 75 mg daily ?-start spironolactone 12.5 mg daily. ?-Patient to check BP at home and keep a log to bring to clinic ?- Plan: CMP, spironolactone (ALDACTONE) 25 MG tablet ? ?Morbid obesity ([place]) ?-Body mass index is 50.53 kg/m?. ?-Continue lifestyle modifications and increasing physical activity ?-Unable to use GLP-1 agonist 2/2 history of pancreatitis on Victoza. ?-Consider restarting follow-up with weight management ? ?Type 2 diabetes mellitus with other specified complication, with long-term current use of insulin ([place])  ?-Hemoglobin A1c 6.8% on 12/09/2020 ?-Continue Tresiba 28 units nightly and metformin ER 1000 mg twice daily. ?-Eye exam up-to-date done 07/26/2019 ?-Foot exam done this visit ?-Continue statin and ARB. ?- Plan: Fructosamine, Hemoglobin A1c, CMP, Hemoglobinopathy Evaluation ? ?Bilateral impacted cerumen ?-Patient requested ears to be cleaned ?-Consent obtained.  Bilateral ears irrigated.  Irrigation d/c'd as patient  felt dizzy. ?-OTC [place]brox eardrops as needed ? ?Mixed hyperlipidemia  ?-continue lifestyle modifications ?-continue pravastatin 40 mg daily ?- Plan: Lipid panel ? ?Vitamin D deficiency  ?- Plan: Vitamin D, 25-hydroxy ?-- Vitamin D level 19.86 this visit.  Rx for ergocalciferol 50,000 IUs weekly x12 weeks sent to pharmacy. ? ?Fatigue, unspecified type  ?-Likely multifactorial including work schedule ?- Plan: CBC with Differential/Platelet, TSH, T4, Free ? ?Hirsutism  ?-2/2 insulin resistance ?-Discussed starting spironolactone which will also help with BP control ?-Plan: spironolactone (ALDACTONE) 25 MG tablet ? ?F/u in 3 months ? ?Grier Mitts, MD ? ?

## 2021-12-12 ENCOUNTER — Other Ambulatory Visit: Payer: Self-pay | Admitting: Family Medicine

## 2021-12-12 NOTE — Progress Notes (Signed)
Error

## 2021-12-16 LAB — HEMOGLOBINOPATHY EVALUATION
Fetal Hemoglobin Testing: <1 % (ref 0.0–1.9)
HCT: 39 % (ref 35.0–45.0)
Hemoglobin A2 - HGBRFX: 2.6 % (ref 2.2–3.2)
Hemoglobin: 12.2 g/dL (ref 11.7–15.5)
Hgb A: 97.4 % (ref >96.0)
MCH: 25.9 pg — ABNORMAL LOW (ref 27.0–33.0)
MCV: 82.8 fL (ref 80.0–100.0)
RBC: 4.71 10*6/uL (ref 3.80–5.10)
RDW: 13.3 % (ref 11.0–15.0)

## 2021-12-16 LAB — FRUCTOSAMINE: Fructosamine: 290 umol/L — ABNORMAL HIGH (ref 205–285)

## 2022-01-12 ENCOUNTER — Encounter: Payer: Self-pay | Admitting: Family Medicine

## 2022-02-02 ENCOUNTER — Other Ambulatory Visit: Payer: Self-pay | Admitting: Family Medicine

## 2022-02-02 ENCOUNTER — Other Ambulatory Visit: Payer: Self-pay | Admitting: Internal Medicine

## 2022-02-02 DIAGNOSIS — F419 Anxiety disorder, unspecified: Secondary | ICD-10-CM

## 2022-02-02 MED ORDER — HYDROXYZINE HCL 50 MG PO TABS: 50.0000 mg | ORAL_TABLET | Freq: Three times a day (TID) | ORAL | 3 refills | Status: AC | PRN

## 2022-02-16 ENCOUNTER — Other Ambulatory Visit: Payer: Self-pay | Admitting: Family Medicine

## 2022-02-16 DIAGNOSIS — I10 Essential (primary) hypertension: Secondary | ICD-10-CM

## 2022-02-19 ENCOUNTER — Ambulatory Visit: Admission: RE | Admit: 2022-02-19 | Payer: Medicaid Other | Source: Ambulatory Visit

## 2022-02-19 ENCOUNTER — Other Ambulatory Visit: Payer: Self-pay | Admitting: Obstetrics and Gynecology

## 2022-02-19 ENCOUNTER — Ambulatory Visit
Admission: RE | Admit: 2022-02-19 | Discharge: 2022-02-19 | Disposition: A | Discharge status: Home / Self Care | Payer: Medicaid Other | Source: Ambulatory Visit | Attending: Obstetrics and Gynecology | Admitting: Obstetrics and Gynecology

## 2022-02-19 DIAGNOSIS — N644 Mastodynia: Secondary | ICD-10-CM

## 2022-02-19 DIAGNOSIS — N6320 Unspecified lump in the left breast, unspecified quadrant: Secondary | ICD-10-CM

## 2022-02-26 ENCOUNTER — Other Ambulatory Visit: Payer: Self-pay | Admitting: Obstetrics and Gynecology

## 2022-02-26 ENCOUNTER — Ambulatory Visit
Admission: RE | Admit: 2022-02-26 | Discharge: 2022-02-26 | Disposition: A | Discharge status: Home / Self Care | Payer: Medicaid Other | Source: Ambulatory Visit | Attending: Obstetrics and Gynecology | Admitting: Obstetrics and Gynecology

## 2022-02-26 DIAGNOSIS — N644 Mastodynia: Secondary | ICD-10-CM

## 2022-02-26 DIAGNOSIS — N6320 Unspecified lump in the left breast, unspecified quadrant: Secondary | ICD-10-CM

## 2022-03-18 ENCOUNTER — Encounter (INDEPENDENT_AMBULATORY_CARE_PROVIDER_SITE_OTHER): Payer: Self-pay

## 2022-03-20 DIAGNOSIS — N61 Mastitis without abscess: Secondary | ICD-10-CM | POA: Diagnosis not present

## 2022-03-31 ENCOUNTER — Ambulatory Visit: Payer: Medicaid Other | Admitting: Internal Medicine

## 2022-03-31 NOTE — Progress Notes (Deleted)
Patient ID: Rachel Dawson, female   DOB: 1980-10-04, 41 y.o.   MRN: [id number]   HPI: Rachel Dawson is a 41 y.o.-year-old female, presenting for follow-up for DM2, dx in 2014, insulin-dependent, uncontrolled, without long term complications (but with hyperglycemia, yeast infections).  Last visit 6 months ago.  Interim history: No increased urination, blurry vision, nausea, chest pain. Before last visit, she just changed to dayshift and starting to go to the gym.  Last hemoglobin A1c was: Lab Results  Component Value Date   HGBA1C 7.4 (H) 12/10/2021   HGBA1C 6.8 (H) 12/09/2020   HGBA1C 7.8 (A) 04/30/2020  04/30/2020: HbA1c calculated from fructosamine is 6.6%, slightly better than before 04/14/2020: HbA1c 6.7% 09/23/2018: HbA1c calculated from fructosamine is 8.2%, lower than the measured one  but higher than before. 06/13/2018: HbA1c from fructosamine: 7.44%  Pt is on a regimen of: - Metformin ER 1000 mg 2x a day with meals -  >> Tresiba 28 units at bedtime She was on Victoza >> pancreatitis. She had nausea and diarrhea from regular metformin. She was on glimepiride 4 mg before dinner but ran out 08/2018 her last visit and we did not restart.   Pt checks her sugars >4x a day with her freestyle libre CGM-started since last visit:  Previously: - am: 90-110 >> 109-115 >> (before her dinner): 130s >> 89, 115-120 - 2h after b'fast: n/c >> 90-143, 164 >> 125-130 >> n/c - before lunch: n/c >> her lunch - 2h after lunch: n/c >> 100-139 >> n/c - before dinner: 96-129, 133 >> 110 >> (waking up): 125-146 >> n/c - 2h after dinner: n/c >> 114-157 >> 138-140 >> n/c >> 110-120 - bedtime: n/c - nighttime: n/c Lowest sugar was 79 >> 98 >> 125 >> 89; it is unclear at which level she has hypoglycemia awareness. Highest sugar was 220 >> 146 >> 200s ([place]ovid).  Glucometer:  One Touch Ultra  She is seen in the [facility].  She eats a low-carb diet.  I advised her to stop juice at  last visit.  She is now only drinking occasional diet sodas.    -No CKD, last BUN/creatinine:  Lab Results  Component Value Date   BUN 15 12/10/2021   BUN 13 09/24/2021   CREATININE 0.87 12/10/2021   CREATININE 0.83 09/24/2021   -+ HL; last set of lipids: Lab Results  Component Value Date   CHOL 190 12/10/2021   HDL 51.30 12/10/2021   LDLCALC 123 (H) 12/10/2021   LDLDIRECT 156.0 06/13/2018   TRIG 79.0 12/10/2021   CHOLHDL 4 12/10/2021  At last visit we increased pravastatin to 40 mg daily.   - last eye exam was in 07/2021: No DR  - no numbness and tingling in her feet.  Latest foot exam was in 05//2023.  Pt has FH of DM in mother (?)  -She is adopted.  ROS: + see HPI  I reviewed pt's medications, allergies, PMH, social hx, family hx, and changes were documented in the history of present illness. Otherwise, unchanged from my initial visit note.  Past Medical History:  Diagnosis Date   Anemia    Fatty liver    History of blood transfusion    "when I had tonsils taken out & w/hyster"   Hypercholesterolemia    Hypertension    Menometrorrhagia    Microcytic anemia    Type II diabetes mellitus ([place])    Vitamin D deficiency    Past Surgical History:  Procedure Laterality Date  ABDOMINAL HYSTERECTOMY  ~ 2012   CESAREAN SECTION  1999; 2004; 2006   HIP PINNING Bilateral ~ 1991   "balls had dropped out of their sockets"   Ong  2006   Social History   Social History   Marital status: Single    Spouse name: N/A   Number of children: 2    Occupational History   Lab tech   Social History Main Topics   Smoking status: Former Smoker    Packs/day: 0.12 - 2 cigs a day    Years: 4.00    Types: Cigarettes    Quit date: 07/10/2014   Smokeless tobacco: Never Used   Alcohol use Yes     Comment: 11/22/2014 "might drink a little a couple times/month, if that"   Drug use: No   Sexual activity: Not Currently    Birth  control/ protection: Surgical   Current Outpatient Medications on File Prior to Visit  Medication Sig Dispense Refill   Accu-Chek Softclix Lancets lancets USE AS DIRECTED 100 each 0   BD PEN NEEDLE NANO 2ND GEN 32G X 4 MM MISC USE ONE NEEDLE TWICE A DAY 200 each 3   Blood Pressure Monitoring (BLOOD PRESSURE MONITOR/L CUFF) MISC Check your blood pressure at the same time each day. 1 each 0   buPROPion (WELLBUTRIN SR) 150 MG 12 hr tablet Take 1 tablet (150 mg total) by mouth daily. (Patient not taking: Reported on 11/05/2021) 30 tablet 0   Continuous Blood Gluc Receiver (FREESTYLE LIBRE 2 READER) DEVI 1 each by Does not apply route daily. 1 each 0   Continuous Blood Gluc Sensor (FREESTYLE LIBRE 2 SENSOR) MISC 1 each by Does not apply route every 14 (fourteen) days. 6 each 3   glucose blood (ACCU-CHEK AVIVA PLUS) test strip Use to check blood sugar twice a day. (Patient not taking: Reported on 12/10/2021) 100 each 11   hydrOXYzine (ATARAX) 50 MG tablet Take 1 tablet (50 mg total) by mouth 3 (three) times daily as needed for anxiety. 60 tablet 3   insulin degludec (TRESIBA FLEXTOUCH) 200 UNIT/ML FlexTouch Pen Inject 14 Units into the skin in the morning and at bedtime. 12 mL 3   irbesartan (AVAPRO) 75 MG tablet TAKE 1 TABLET BY MOUTH EVERY DAY 90 tablet 1   metFORMIN (GLUCOPHAGE-XR) 500 MG 24 hr tablet TAKE 2 TABLETS BY MOUTH 2 TIMES DAILY. GIVE W/FOOD. 360 tablet 1   naproxen (NAPROSYN) 500 MG tablet Take 1 tablet (500 mg total) by mouth 2 (two) times daily. 30 tablet 0   pravastatin (PRAVACHOL) 20 MG tablet Take 2 tablets (40 mg total) by mouth daily. 180 tablet 1   pravastatin (PRAVACHOL) 40 MG tablet Take 1 tablet (40 mg total) by mouth daily. 90 tablet 3   rizatriptan (MAXALT) 5 MG tablet Take 1 tablet (5 mg total) by mouth as needed for migraine. May repeat in 2 hours if needed 10 tablet 3   spironolactone (ALDACTONE) 25 MG tablet Take 0.5 tablets (12.5 mg total) by mouth daily. 90 tablet 1    Vitamin D, Ergocalciferol, (DRISDOL) 1.25 MG (50000 UNIT) CAPS capsule Take 1 capsule (50,000 Units total) by mouth every 7 (seven) days. 12 capsule 0   [DISCONTINUED] ferrous fumarate (HEMOCYTE - 106 MG FE) 325 (106 FE) MG TABS Take 1 tablet by mouth daily.       [DISCONTINUED] medroxyPROGESTERone (PROVERA) 10 MG tablet Take 10 mg by mouth daily.  No current facility-administered medications on file prior to visit.   Allergies  Allergen Reactions   Latex Rash   Family History  Adopted: Yes  Problem Relation Age of Onset   Hypertension Daughter     PE: LMP 01/05/2011 (Exact Date)  Wt Readings from Last 3 Encounters:  12/10/21 (!) 322 lb 9.6 oz (146.3 kg)  11/05/21 (!) 319 lb 12.8 oz (145.1 kg)  09/24/21 (!) 315 lb 9.6 oz (143.2 kg)   Constitutional: overweight, in NAD Eyes: no exophthalmos ENT: moist mucous membranes, no masses palpated in neck, no cervical lymphadenopathy Cardiovascular: RRR, No MRG Respiratory: CTA B Musculoskeletal: no deformities Skin: moist, warm, no rashes Neurological: no tremor with outstretched hands  ASSESSMENT: 1. DM2, insulin-dependent, uncontrolled, without long term complications, but with hyperglycemia  2. Obesity BMI Classification: < 18.5 underweight  18.5-24.9 normal weight  25.0-29.9 overweight  30.0-34.9 class I obesity  35.0-39.9 class II obesity  ? 40.0 class III obesity   3. HL  PLAN:  1. Patient with history of uncontrolled type 2 diabetes, on oral antidiabetic regimen with metformin, and also long-acting insulin, with fair diabetes control.  At last visit, HbA1c was higher, at 7.4%.  At that time, she returned after having had COVID and was still not feeling at baseline.  At that time, we did not change her regimen but I did recommend a freestyle libre 2 CGM.  She was able to obtain this. CGM interpretation: -At today's visit, we reviewed her CGM downloads: It appears that *** of values are in target range (goal >70%),  while *** are higher than 180 (goal <25%), and *** are lower than 70 (goal <4%).  The calculated average blood sugar is ***.  The projected HbA1c for the next 3 months ([facility]) is ***. -Reviewing the CGM trends, ***  - I suggested to:  Patient Instructions  Please continue: - Metformin ER 1000 mg 2x a day with meals - Tresiba 28 units at bedtime  Continue pravastatin 40 mg daily.  Please return in 6 months.  - we checked her HbA1c: 7%  - advised to check sugars at different times of the day - 4x a day, rotating check times - advised for yearly eye exams >> she is UTD - return to clinic in 6 months  2. Obesity class 3 -Unfortunately, we cannot use a GLP-1 receptor agonist for her as she developed pancreatitis while on Victoza -She previously had increased urination on chlorthalidone and also a perineal infection so we did not start SGLT2 inhibitors -She gained 46 pounds before the last 3 visits combined -She did start to go to the gym but stopped afterwards and restarted right before last visit. -  3. HL -Reviewed latest lipid panel from last visit: LDL above target, the rest the fractions at goal: Lab Results  Component Value Date   CHOL 190 12/10/2021   HDL 51.30 12/10/2021   LDLCALC 123 (H) 12/10/2021   LDLDIRECT 156.0 06/13/2018   TRIG 79.0 12/10/2021   CHOLHDL 4 12/10/2021  -At last visit I advised her to increase pravastatin to 40 mg daily -she started this and tolerates it well  Philemon Kingdom, MD PhD [facility] Endocrinology

## 2022-04-01 ENCOUNTER — Ambulatory Visit
Admission: RE | Admit: 2022-04-01 | Discharge: 2022-04-01 | Disposition: A | Discharge status: Home / Self Care | Payer: Medicaid Other | Source: Ambulatory Visit | Attending: Obstetrics and Gynecology | Admitting: Obstetrics and Gynecology

## 2022-04-01 ENCOUNTER — Other Ambulatory Visit: Payer: Self-pay | Admitting: Obstetrics and Gynecology

## 2022-04-01 DIAGNOSIS — N644 Mastodynia: Secondary | ICD-10-CM

## 2022-04-01 DIAGNOSIS — N6320 Unspecified lump in the left breast, unspecified quadrant: Secondary | ICD-10-CM

## 2022-04-27 ENCOUNTER — Other Ambulatory Visit: Payer: Self-pay | Admitting: Family Medicine

## 2022-04-27 DIAGNOSIS — E559 Vitamin D deficiency, unspecified: Secondary | ICD-10-CM

## 2022-05-08 ENCOUNTER — Inpatient Hospital Stay: Admission: RE | Admit: 2022-05-08 | Payer: Medicaid Other | Source: Ambulatory Visit

## 2022-05-25 DIAGNOSIS — B009 Herpesviral infection, unspecified: Secondary | ICD-10-CM | POA: Insufficient documentation

## 2022-06-03 ENCOUNTER — Ambulatory Visit (INDEPENDENT_AMBULATORY_CARE_PROVIDER_SITE_OTHER): Payer: Medicaid Other | Admitting: Internal Medicine

## 2022-06-03 ENCOUNTER — Encounter: Payer: Self-pay | Admitting: Internal Medicine

## 2022-06-03 VITALS — BP 128/80 | HR 80 | Ht 67.0 in | Wt 314.4 lb

## 2022-06-03 DIAGNOSIS — E785 Hyperlipidemia, unspecified: Secondary | ICD-10-CM | POA: Diagnosis not present

## 2022-06-03 DIAGNOSIS — E1165 Type 2 diabetes mellitus with hyperglycemia: Secondary | ICD-10-CM | POA: Diagnosis not present

## 2022-06-03 DIAGNOSIS — Z6841 Body Mass Index (BMI) 40.0 and over, adult: Secondary | ICD-10-CM

## 2022-06-03 DIAGNOSIS — Z794 Long term (current) use of insulin: Secondary | ICD-10-CM | POA: Diagnosis not present

## 2022-06-03 LAB — POCT GLYCOSYLATED HEMOGLOBIN (HGB A1C): Hemoglobin A1C: 7.3 % — AB (ref 4.0–5.6)

## 2022-06-03 MED ORDER — DAPAGLIFLOZIN PROPANEDIOL 5 MG PO TABS
5.0000 mg | ORAL_TABLET | Freq: Every day | ORAL | 3 refills | Status: DC
  Filled 2022-09-30: qty 90, 90d supply, fill #0

## 2022-06-03 NOTE — Patient Instructions (Addendum)
Please continue: - Metformin ER 1000 mg 2x a day with meals  Decrease: - Tresiba 11 units 2x a day (22 units daily)  Start: - Farxiga 5 mg before b'fast  Continue pravastatin 40 mg daily.  Please return in 4 months.

## 2022-06-03 NOTE — Progress Notes (Signed)
Patient ID: Rachel Dawson, female   DOB: 07/27/1981, 41 y.o.   MRN: [id number]   HPI: Rachel Dawson is a 41 y.o.-year-old female, presenting for follow-up for DM2, dx in 2014, insulin-dependent, uncontrolled, without long term complications (but with hyperglycemia, yeast infections).  Last visit 8 months ago.  Interim history: Since last visit she was able to obtain a CGM - Libre 2.  However, she was not able to use it in the last 2 weeks as her previous phone did not connect with the sensor and she had to change her phone. No increased urination, blurry vision, nausea, chest pain. She now works dayshift - at the [employer] and has shifts at [employer].  Last hemoglobin A1c was: Lab Results  Component Value Date   HGBA1C 7.4 (H) 12/10/2021   HGBA1C 6.8 (H) 12/09/2020   HGBA1C 7.8 (A) 04/30/2020  04/30/2020: HbA1c calculated from fructosamine is 6.6%, slightly better than before 04/14/2020: HbA1c 6.7% 09/23/2018: HbA1c calculated from fructosamine is 8.2%, lower than the measured one  but higher than before. 06/13/2018: HbA1c from fructosamine: 7.44%  Pt is on a regimen of: - Metformin ER 1000 mg 2x a day with meals -  >> Tresiba 28 units at bedtime >> 14 units 2x a day  She was on Victoza >> pancreatitis. She had nausea and diarrhea from regular metformin. She was on glimepiride 4 mg before dinner but ran out 08/2018 her last visit and we did not restart.   Pt checks her sugars >4x a day with her CGM - could not download it: - am: 90-110 >> 109-115 >> 130s >> 89, 115-120 >> 118-156 - 2h after b'fast: n/c >> 90-143, 164 >> 125-130 >> n/c - before lunch: n/c >> her lunch >> 98-108 - 2h after lunch: n/c >> 100-139 >> n/c >> 137-163, 245 - before dinner: 96-129, 133 >> 110 >> 125-146 >> n/c >> 73-90s - 2h after dinner: 114-157 >> 138-140 >> n/c >> 110-120 - bedtime: n/c - nighttime: n/c Lowest sugar was 125 >> 89 >> 70s; it is unclear at which level she has hypoglycemia  awareness. Highest sugar was 146 >> 200s (Covid) >> 245.  Glucometer:  One Touch Ultra  She is seen in the [facility].  She eats a low-carb diet.  I advised her to stop juice at last visit.  She is now only drinking occasional diet sodas.    -No CKD, last BUN/creatinine:  Lab Results  Component Value Date   BUN 15 12/10/2021   BUN 13 09/24/2021   CREATININE 0.87 12/10/2021   CREATININE 0.83 09/24/2021   -+ HL; last set of lipids: Lab Results  Component Value Date   CHOL 190 12/10/2021   HDL 51.30 12/10/2021   LDLCALC 123 (H) 12/10/2021   LDLDIRECT 156.0 06/13/2018   TRIG 79.0 12/10/2021   CHOLHDL 4 12/10/2021  On pravastatin 40 mg daily, dose increased after the above results returned.  - last eye exam was in 07/2021: No DR  - no numbness and tingling in her feet.  Latest foot exam was on 12/2021.  Pt has FH of DM in mother (?)  -She is adopted.  ROS: + see HPI  I reviewed pt's medications, allergies, PMH, social hx, family hx, and changes were documented in the history of present illness. Otherwise, unchanged from my initial visit note.  Past Medical History:  Diagnosis Date   Anemia    Fatty liver    History of blood transfusion    "  when I had tonsils taken out & w/hyster"   Hypercholesterolemia    Hypertension    Menometrorrhagia    Microcytic anemia    Type II diabetes mellitus ([place])    Vitamin D deficiency    Past Surgical History:  Procedure Laterality Date   ABDOMINAL HYSTERECTOMY  ~ 2012   [place]; 2004; 2006   HIP PINNING Bilateral ~ 1991   "balls had dropped out of their sockets"   [place]  2006   Social History   Social History   Marital status: Single    Spouse name: N/A   Number of children: 2    Occupational History   Lab tech   Social History Main Topics   Smoking status: Former Smoker    Packs/day: 0.12 - 2 cigs a day    Years: 4.00    Types: Cigarettes     Quit date: 07/10/2014   Smokeless tobacco: Never Used   Alcohol use Yes     Comment: 11/22/2014 "might drink a little a couple times/month, if that"   Drug use: No   Sexual activity: Not Currently    Birth control/ protection: Surgical   Current Outpatient Medications on File Prior to Visit  Medication Sig Dispense Refill   Accu-Chek Softclix Lancets lancets USE AS DIRECTED 100 each 0   BD PEN NEEDLE NANO 2ND GEN 32G X 4 MM MISC USE ONE NEEDLE TWICE A DAY 200 each 3   Blood Pressure Monitoring (BLOOD PRESSURE MONITOR/L CUFF) MISC Check your blood pressure at the same time each day. 1 each 0   buPROPion (WELLBUTRIN SR) 150 MG 12 hr tablet Take 1 tablet (150 mg total) by mouth daily. (Patient not taking: Reported on 11/05/2021) 30 tablet 0   Continuous Blood Gluc Receiver (FREESTYLE LIBRE 2 READER) DEVI 1 each by Does not apply route daily. 1 each 0   Continuous Blood Gluc Sensor (FREESTYLE LIBRE 2 SENSOR) MISC 1 each by Does not apply route every 14 (fourteen) days. 6 each 3   glucose blood (ACCU-CHEK AVIVA PLUS) test strip Use to check blood sugar twice a day. (Patient not taking: Reported on 12/10/2021) 100 each 11   hydrOXYzine (ATARAX) 50 MG tablet Take 1 tablet (50 mg total) by mouth 3 (three) times daily as needed for anxiety. 60 tablet 3   insulin degludec (TRESIBA FLEXTOUCH) 200 UNIT/ML FlexTouch Pen Inject 14 Units into the skin in the morning and at bedtime. 12 mL 3   irbesartan (AVAPRO) 75 MG tablet TAKE 1 TABLET BY MOUTH EVERY DAY 90 tablet 1   metFORMIN (GLUCOPHAGE-XR) 500 MG 24 hr tablet TAKE 2 TABLETS BY MOUTH 2 TIMES DAILY. GIVE W/FOOD. 360 tablet 1   naproxen (NAPROSYN) 500 MG tablet Take 1 tablet (500 mg total) by mouth 2 (two) times daily. 30 tablet 0   pravastatin (PRAVACHOL) 20 MG tablet Take 2 tablets (40 mg total) by mouth daily. 180 tablet 1   pravastatin (PRAVACHOL) 40 MG tablet Take 1 tablet (40 mg total) by mouth daily. 90 tablet 3   rizatriptan (MAXALT) 5 MG tablet  Take 1 tablet (5 mg total) by mouth as needed for migraine. May repeat in 2 hours if needed 10 tablet 3   spironolactone (ALDACTONE) 25 MG tablet Take 0.5 tablets (12.5 mg total) by mouth daily. 90 tablet 1   Vitamin D, Ergocalciferol, (DRISDOL) 1.25 MG (50000 UNIT) CAPS capsule Take 1 capsule (50,000 Units total) by  mouth every 7 (seven) days. 12 capsule 0   [DISCONTINUED] ferrous fumarate (HEMOCYTE - 106 MG FE) 325 (106 FE) MG TABS Take 1 tablet by mouth daily.       [DISCONTINUED] medroxyPROGESTERone (PROVERA) 10 MG tablet Take 10 mg by mouth daily.       No current facility-administered medications on file prior to visit.   Allergies  Allergen Reactions   Latex Rash   Family History  Adopted: Yes  Problem Relation Age of Onset   Hypertension Daughter     PE: BP 128/80 (BP Location: Right Arm, Patient Position: Sitting, Cuff Size: Normal)   Pulse 80   Ht '5\' 7"'$  (1.702 m)   Wt (!) 314 lb 6.4 oz (142.6 kg)   LMP 01/05/2011 (Exact Date)   SpO2 92%   BMI 49.24 kg/m  Wt Readings from Last 3 Encounters:  06/03/22 (!) 314 lb 6.4 oz (142.6 kg)  12/10/21 (!) 322 lb 9.6 oz (146.3 kg)  11/05/21 (!) 319 lb 12.8 oz (145.1 kg)   Constitutional: overweight, in NAD Eyes:  EOMI, no exophthalmos ENT: no neck masses, no cervical lymphadenopathy Cardiovascular: RRR, No MRG Respiratory: CTA B Musculoskeletal: no deformities Skin:no rashes Neurological: no tremor with outstretched hands  ASSESSMENT: 1. DM2, insulin-dependent, uncontrolled, without long term complications, but with hyperglycemia  2. Obesity BMI Classification: < 18.5 underweight  18.5-24.9 normal weight  25.0-29.9 overweight  30.0-34.9 class I obesity  35.0-39.9 class II obesity  ? 40.0 class III obesity   3. HL  PLAN:  1. Patient with history of uncontrolled type 2 diabetes, on oral antidiabetic regimen with metformin and long-acting insulin with improved diabetes control so she switched from night shift to day  shift.  HbA1c was higher at last check, 5 months ago, at 7.4%, increased from 6.8%.  At that time, we did not change her regimen, but I did recommend a CGM.  She was finally able to obtain this.  We had to switch from daily [place] to twice daily [place] for her to qualify for this. -At today's visit, sugars appear to be slightly higher than goal in the morning and they are more controlled later in the day with few exceptions.  She had 1 blood sugar in the 200s after dietary indiscretions, but the rest of the blood sugars appear to be better.  We did not check a fructosamine level today.  -At today's visit, since she did have some blood sugars in the 70s especially after delaying a meal, we discussed about reducing her Tresiba dose and I also suggested to add an SGLT2 inhibitor to help with blood sugars and also with weight loss. - I suggested to:  Patient Instructions  Please continue: - Metformin ER 1000 mg 2x a day with meals  Decrease: - Tresiba 11 units 2x a day (22 units daily)  Start: - Farxiga 5 mg before b'fast  Continue pravastatin 40 mg daily.  Please return in 4 months.  - we checked her HbA1c: 7.3% (lower), but higher than expected from her blood sugars at home - advised to check sugars at different times of the day - 4x a day, rotating check times - advised for yearly eye exams >> she is UTD - return to clinic in 4 months  2. Obesity class 3 -Unfortunately, we cannot use a GLP-1 receptor agonist for her as she developed pancreatitis while on Victoza.   -She previously had increased urination on chlorthalidone and also perineal infection, so we did not start  SGLT2 inhibitors daily.  However, her infection is long healed and she would like to try this class of medication now.  We will start a low-dose of Farxiga.  Advised her to stay hydrated.  We discussed about benefits and possible side effects. -She gained 46 pounds before the last 3 visits combined -She lost 8 pounds since  last visit  3. HL -Reviewed the latest lipid panel from 12/2021: LDL above target, the rest the fractions at goal: Lab Results  Component Value Date   CHOL 190 12/10/2021   HDL 51.30 12/10/2021   LDLCALC 123 (H) 12/10/2021   LDLDIRECT 156.0 06/13/2018   TRIG 79.0 12/10/2021   CHOLHDL 4 12/10/2021  -She is on pravastatin 40 mg daily, dose increased at last visit  Philemon Kingdom, MD PhD [facility] Endocrinology

## 2022-06-10 ENCOUNTER — Encounter: Payer: Self-pay | Admitting: Internal Medicine

## 2022-06-10 DIAGNOSIS — R896 Abnormal cytological findings in specimens from other organs, systems and tissues: Secondary | ICD-10-CM | POA: Diagnosis not present

## 2022-06-10 DIAGNOSIS — E1165 Type 2 diabetes mellitus with hyperglycemia: Secondary | ICD-10-CM

## 2022-06-10 DIAGNOSIS — N6452 Nipple discharge: Secondary | ICD-10-CM | POA: Diagnosis not present

## 2022-06-10 MED ORDER — TRESIBA FLEXTOUCH 200 UNIT/ML ~~LOC~~ SOPN: 14.0000 [IU] | PEN_INJECTOR | Freq: Two times a day (BID) | SUBCUTANEOUS | 0 refills | Status: DC

## 2022-06-11 ENCOUNTER — Ambulatory Visit: Payer: Medicaid Other | Admitting: Internal Medicine

## 2022-06-17 ENCOUNTER — Encounter (INDEPENDENT_AMBULATORY_CARE_PROVIDER_SITE_OTHER): Payer: Medicaid Other | Admitting: Internal Medicine

## 2022-06-17 DIAGNOSIS — Z794 Long term (current) use of insulin: Secondary | ICD-10-CM | POA: Diagnosis not present

## 2022-06-17 DIAGNOSIS — E1165 Type 2 diabetes mellitus with hyperglycemia: Secondary | ICD-10-CM | POA: Diagnosis not present

## 2022-06-18 ENCOUNTER — Other Ambulatory Visit (HOSPITAL_COMMUNITY): Payer: Self-pay

## 2022-06-18 ENCOUNTER — Other Ambulatory Visit: Payer: Self-pay | Admitting: Internal Medicine

## 2022-06-18 MED ORDER — FREESTYLE LIBRE 2 SENSOR MISC
1.0000 | 3 refills | Status: DC
  Filled 2022-06-18: qty 2, 28d supply, fill #0
  Filled 2022-10-06: qty 6, 84d supply, fill #0
  Filled 2023-01-01: qty 6, 84d supply, fill #1
  Filled 2023-03-21: qty 6, 84d supply, fill #2
  Filled 2023-06-16: qty 6, 84d supply, fill #3

## 2022-06-18 NOTE — Telephone Encounter (Addendum)
   CGM interpretation: -At today's visit, we reviewed her CGM downloads: It appears that 89% of values are in target range (goal >70%), while 11% are higher than 180 (goal <25%), and 0% are lower than 70 (goal <4%).  The calculated average blood sugar is 139.  The projected HbA1c for the next 3 months (GMI) is 6.6%. -Reviewing the CGM trends, sugars appear to be fairly well controlled during the day, but she does have lower blood sugars, in the 60s, especially in the last few days.  She still has an occasional high blood sugars after dinner, but these are not frequent.  It appears that patient is on an excessive dose of long-acting insulin.  I advised her to reduce Tresiba dose from 22 to 16 units daily.  For now, we will continue the plan to start Farxiga, as discussed.  She did not receive it from the pharmacy yet.  Please see the MyChart message reply(ies) for my assessment and plan.    This patient gave consent for this Medical Advice Message and is aware that it may result in a bill to [employer], as well as the possibility of receiving a bill for a co-payment or deductible. They are an established patient, but are not seeking medical advice exclusively about a problem treated during an in person or video visit in the last seven days. I did not recommend an in person or video visit within seven days of my reply.    I spent a total of 7 minutes cumulative time within 7 days through [employer].  Philemon Kingdom, MD PhD [facility] Endocrinology

## 2022-06-24 DIAGNOSIS — N6452 Nipple discharge: Secondary | ICD-10-CM | POA: Diagnosis not present

## 2022-06-25 ENCOUNTER — Other Ambulatory Visit (HOSPITAL_COMMUNITY): Payer: Self-pay

## 2022-07-07 ENCOUNTER — Ambulatory Visit: Payer: Medicaid Other | Admitting: Internal Medicine

## 2022-07-09 DIAGNOSIS — N6320 Unspecified lump in the left breast, unspecified quadrant: Secondary | ICD-10-CM | POA: Diagnosis not present

## 2022-07-09 DIAGNOSIS — N6452 Nipple discharge: Secondary | ICD-10-CM | POA: Diagnosis not present

## 2022-07-09 DIAGNOSIS — R92323 Mammographic fibroglandular density, bilateral breasts: Secondary | ICD-10-CM | POA: Diagnosis not present

## 2022-07-20 DIAGNOSIS — N6452 Nipple discharge: Secondary | ICD-10-CM | POA: Diagnosis not present

## 2022-07-20 DIAGNOSIS — D4862 Neoplasm of uncertain behavior of left breast: Secondary | ICD-10-CM | POA: Diagnosis not present

## 2022-07-20 DIAGNOSIS — N6042 Mammary duct ectasia of left breast: Secondary | ICD-10-CM | POA: Diagnosis not present

## 2022-07-24 DIAGNOSIS — N649 Disorder of breast, unspecified: Secondary | ICD-10-CM | POA: Diagnosis not present

## 2022-08-06 DIAGNOSIS — E119 Type 2 diabetes mellitus without complications: Secondary | ICD-10-CM | POA: Diagnosis not present

## 2022-08-06 DIAGNOSIS — I10 Essential (primary) hypertension: Secondary | ICD-10-CM | POA: Diagnosis not present

## 2022-08-06 DIAGNOSIS — E785 Hyperlipidemia, unspecified: Secondary | ICD-10-CM | POA: Diagnosis not present

## 2022-08-06 DIAGNOSIS — D249 Benign neoplasm of unspecified breast: Secondary | ICD-10-CM | POA: Diagnosis not present

## 2022-08-06 DIAGNOSIS — D242 Benign neoplasm of left breast: Secondary | ICD-10-CM | POA: Diagnosis not present

## 2022-08-08 ENCOUNTER — Other Ambulatory Visit: Payer: Self-pay | Admitting: Internal Medicine

## 2022-08-08 DIAGNOSIS — I10 Essential (primary) hypertension: Secondary | ICD-10-CM

## 2022-08-09 ENCOUNTER — Other Ambulatory Visit: Payer: Self-pay | Admitting: Internal Medicine

## 2022-08-11 ENCOUNTER — Other Ambulatory Visit: Payer: Self-pay | Admitting: Internal Medicine

## 2022-08-11 DIAGNOSIS — E1165 Type 2 diabetes mellitus with hyperglycemia: Secondary | ICD-10-CM

## 2022-08-12 DIAGNOSIS — Z9889 Other specified postprocedural states: Secondary | ICD-10-CM | POA: Diagnosis not present

## 2022-08-21 ENCOUNTER — Telehealth (INDEPENDENT_AMBULATORY_CARE_PROVIDER_SITE_OTHER): Payer: Commercial Managed Care - PPO | Admitting: Family Medicine

## 2022-08-21 ENCOUNTER — Encounter: Payer: Self-pay | Admitting: Family Medicine

## 2022-08-21 VITALS — Ht 67.0 in | Wt 314.0 lb

## 2022-08-21 DIAGNOSIS — R519 Headache, unspecified: Secondary | ICD-10-CM

## 2022-08-21 DIAGNOSIS — J069 Acute upper respiratory infection, unspecified: Secondary | ICD-10-CM

## 2022-08-21 DIAGNOSIS — J3489 Other specified disorders of nose and nasal sinuses: Secondary | ICD-10-CM | POA: Diagnosis not present

## 2022-08-21 MED ORDER — FLUTICASONE PROPIONATE 50 MCG/ACT NA SUSP
1.0000 | Freq: Every day | NASAL | 0 refills | Status: DC
  Filled 2022-09-30: qty 16, 60d supply, fill #0

## 2022-08-21 NOTE — Progress Notes (Signed)
Virtual Visit via Video Note  I connected with on 08/21/22 at  3:15 PM EST by a video enabled telemedicine application 2/2 [id number] pandemic and verified that I am speaking with the correct person using two identifiers.  Location patient: home Location provider:work or home office Persons participating in the virtual visit: patient, provider  I discussed the limitations of evaluation and management by telemedicine and the availability of in person appointments. The patient expressed understanding and agreed to proceed.  Chief Complaint  Patient presents with   Sinus Problem    Pt reports she is having pressure under eyes and head pressure and a little cough. Taking ibuprofen     HPI: Pt states her whole  head is killing her.  Pt with pressure around eyes and nose.  Symptoms started yesterday.  COVID and flu testing negative.  Mild rhinorrhea.  Throat not really sore. Denies ear pain/pressure, postnasal drainage, n/v, diarrhea. Took ibuprofen.    ROS: See pertinent positives and negatives per HPI.  Past Medical History:  Diagnosis Date   Anemia    Fatty liver    History of blood transfusion    "when I had tonsils taken out & w/hyster"   Hypercholesterolemia    Hypertension    Menometrorrhagia    Microcytic anemia    Type II diabetes mellitus ([place])    Vitamin D deficiency     Past Surgical History:  Procedure Laterality Date   ABDOMINAL HYSTERECTOMY  ~ 2012   [place]; 2004; 2006   HIP PINNING Bilateral ~ 1991   "balls had dropped out of their sockets"   [place]     TUBAL LIGATION  2006    Family History  Adopted: Yes  Problem Relation Age of Onset   Hypertension Daughter      Current Outpatient Medications:    BD PEN NEEDLE NANO 2ND GEN 32G X 4 MM MISC, USE ONE NEEDLE TWICE A DAY, Disp: 200 each, Rfl: 3   Blood Pressure Monitoring (BLOOD PRESSURE MONITOR/L CUFF) MISC, Check your blood pressure at the same time each day., Disp:  1 each, Rfl: 0   Continuous Blood Gluc Receiver (FREESTYLE LIBRE 2 READER) DEVI, 1 each by Does not apply route daily., Disp: 1 each, Rfl: 0   Continuous Blood Gluc Sensor (FREESTYLE LIBRE 2 SENSOR) MISC, Use as directed & change every 14 days to check blood sugar., Disp: 6 each, Rfl: 3   dapagliflozin propanediol (FARXIGA) 5 MG TABS tablet, Take 1 tablet (5 mg total) by mouth daily before breakfast., Disp: 90 tablet, Rfl: 3   hydrOXYzine (ATARAX) 50 MG tablet, Take 1 tablet (50 mg total) by mouth 3 (three) times daily as needed for anxiety., Disp: 60 tablet, Rfl: 3   insulin degludec (TRESIBA FLEXTOUCH) 200 UNIT/ML FlexTouch Pen, INJECT 28 UNITS INTO THE SKIN DAILY., Disp: 9 mL, Rfl: 0   irbesartan (AVAPRO) 75 MG tablet, TAKE 1 TABLET BY MOUTH EVERY DAY, Disp: 90 tablet, Rfl: 1   metFORMIN (GLUCOPHAGE-XR) 500 MG 24 hr tablet, TAKE 2 TABLETS BY MOUTH 2 TIMES DAILY. GIVE W/FOOD., Disp: 360 tablet, Rfl: 1   pravastatin (PRAVACHOL) 20 MG tablet, TAKE 2 TABLETS (40 MG TOTAL) BY MOUTH DAILY., Disp: 180 tablet, Rfl: 1   pravastatin (PRAVACHOL) 40 MG tablet, Take 1 tablet (40 mg total) by mouth daily., Disp: 90 tablet, Rfl: 3   rizatriptan (MAXALT) 5 MG tablet, Take 1 tablet (5 mg total) by mouth as needed for migraine. May repeat  in 2 hours if needed, Disp: 10 tablet, Rfl: 3   spironolactone (ALDACTONE) 25 MG tablet, Take 0.5 tablets (12.5 mg total) by mouth daily., Disp: 90 tablet, Rfl: 1   Vitamin D, Ergocalciferol, (DRISDOL) 1.25 MG (50000 UNIT) CAPS capsule, Take 1 capsule (50,000 Units total) by mouth every 7 (seven) days., Disp: 12 capsule, Rfl: 0  EXAM:  VITALS per patient if applicable: RR between 91-63 bpm  GENERAL: alert, oriented, appears well and in no acute distress  HEENT: atraumatic, conjunctiva clear, no obvious abnormalities on inspection of external nose and ears  NECK: normal movements of the head and neck  LUNGS: on inspection no signs of respiratory distress, breathing rate  appears normal, no obvious gross SOB, gasping or wheezing  CV: no obvious cyanosis  MS: moves all visible extremities without noticeable abnormality  PSYCH/NEURO: pleasant and cooperative, no obvious depression or anxiety, speech and thought processing grossly intact  ASSESSMENT AND PLAN:  Discussed the following assessment and plan:  Viral URI - Plan: fluticasone (FLONASE) 50 MCG/ACT nasal spray  Acute nonintractable headache, unspecified headache type  Sinus pressure - Plan: fluticasone (FLONASE) 50 MCG/ACT nasal spray  Acute URI symptoms x 1 day likely viral.  Given acute symptoms less likely bacterial sinusitis.  COVID and flu testing negative.  Patient encouraged to retest in a few days.  ABX not indicated at this time. Supportive care for current symptoms with OTC meds for patients with HTN and DM.  Steam from shower and warm fluids also encouraged.  Flonase.    Follow-up as needed next week for continued or worsening symptoms  I discussed the assessment and treatment plan with the patient. The patient was provided an opportunity to ask questions and all were answered. The patient agreed with the plan and demonstrated an understanding of the instructions.   The patient was advised to call back or seek an in-person evaluation if the symptoms worsen or if the condition fails to improve as anticipated.  Billie Ruddy, MD

## 2022-08-24 ENCOUNTER — Encounter: Payer: Self-pay | Admitting: Family Medicine

## 2022-08-24 DIAGNOSIS — I10 Essential (primary) hypertension: Secondary | ICD-10-CM

## 2022-08-24 MED ORDER — IRBESARTAN 75 MG PO TABS: 75.0000 mg | ORAL_TABLET | Freq: Every day | ORAL | 1 refills | Status: DC

## 2022-09-01 ENCOUNTER — Encounter (HOSPITAL_BASED_OUTPATIENT_CLINIC_OR_DEPARTMENT_OTHER): Payer: Self-pay

## 2022-09-01 ENCOUNTER — Other Ambulatory Visit: Payer: Self-pay

## 2022-09-01 ENCOUNTER — Emergency Department (HOSPITAL_BASED_OUTPATIENT_CLINIC_OR_DEPARTMENT_OTHER): Payer: Commercial Managed Care - PPO

## 2022-09-01 ENCOUNTER — Emergency Department (HOSPITAL_BASED_OUTPATIENT_CLINIC_OR_DEPARTMENT_OTHER)
Admission: EM | Admit: 2022-09-01 | Discharge: 2022-09-01 | Disposition: A | Discharge status: Home / Self Care | Payer: Commercial Managed Care - PPO | Attending: Emergency Medicine | Admitting: Emergency Medicine

## 2022-09-01 DIAGNOSIS — R519 Headache, unspecified: Secondary | ICD-10-CM | POA: Diagnosis present

## 2022-09-01 DIAGNOSIS — G43909 Migraine, unspecified, not intractable, without status migrainosus: Secondary | ICD-10-CM | POA: Insufficient documentation

## 2022-09-01 DIAGNOSIS — Z79899 Other long term (current) drug therapy: Secondary | ICD-10-CM | POA: Diagnosis not present

## 2022-09-01 DIAGNOSIS — G43009 Migraine without aura, not intractable, without status migrainosus: Secondary | ICD-10-CM | POA: Diagnosis not present

## 2022-09-01 DIAGNOSIS — Z7984 Long term (current) use of oral hypoglycemic drugs: Secondary | ICD-10-CM | POA: Diagnosis not present

## 2022-09-01 DIAGNOSIS — R200 Anesthesia of skin: Secondary | ICD-10-CM | POA: Diagnosis not present

## 2022-09-01 DIAGNOSIS — I160 Hypertensive urgency: Secondary | ICD-10-CM | POA: Insufficient documentation

## 2022-09-01 DIAGNOSIS — Z794 Long term (current) use of insulin: Secondary | ICD-10-CM | POA: Diagnosis not present

## 2022-09-01 DIAGNOSIS — E119 Type 2 diabetes mellitus without complications: Secondary | ICD-10-CM | POA: Diagnosis not present

## 2022-09-01 DIAGNOSIS — Z91040 Latex allergy status: Secondary | ICD-10-CM | POA: Diagnosis not present

## 2022-09-01 DIAGNOSIS — I10 Essential (primary) hypertension: Secondary | ICD-10-CM | POA: Diagnosis not present

## 2022-09-01 LAB — URINALYSIS, ROUTINE W REFLEX MICROSCOPIC
Bilirubin Urine: NEGATIVE
Glucose, UA: NEGATIVE mg/dL
Hgb urine dipstick: NEGATIVE
Ketones, ur: NEGATIVE mg/dL
Leukocytes,Ua: NEGATIVE
Nitrite: NEGATIVE
Protein, ur: NEGATIVE mg/dL
Specific Gravity, Urine: 1.01 (ref 1.005–1.030)
pH: 6 (ref 5.0–8.0)

## 2022-09-01 LAB — TROPONIN I (HIGH SENSITIVITY): Troponin I (High Sensitivity): 3 ng/L (ref <18)

## 2022-09-01 LAB — BASIC METABOLIC PANEL
Anion gap: 8 (ref 5–15)
BUN: 14 mg/dL (ref 6–20)
CO2: 24 mmol/L (ref 22–32)
Calcium: 9.2 mg/dL (ref 8.9–10.3)
Chloride: 104 mmol/L (ref 98–111)
Creatinine, Ser: 0.89 mg/dL (ref 0.44–1.00)
GFR, Estimated: >60 mL/min (ref >60)
Glucose, Bld: 106 mg/dL — ABNORMAL HIGH (ref 70–99)
Potassium: 3.7 mmol/L (ref 3.5–5.1)
Sodium: 136 mmol/L (ref 135–145)

## 2022-09-01 LAB — CBC
HCT: 38.7 % (ref 36.0–46.0)
Hemoglobin: 12.3 g/dL (ref 12.0–15.0)
MCH: 26.2 pg (ref 26.0–34.0)
MCHC: 31.8 g/dL (ref 30.0–36.0)
MCV: 82.3 fL (ref 80.0–100.0)
Platelets: 342 10*3/uL (ref 150–400)
RBC: 4.7 MIL/uL (ref 3.87–5.11)
RDW: 13.7 % (ref 11.5–15.5)
WBC: 7.9 10*3/uL (ref 4.0–10.5)
nRBC: 0 % (ref 0.0–0.2)

## 2022-09-01 LAB — CBG MONITORING, ED: Glucose-Capillary: 93 mg/dL (ref 70–99)

## 2022-09-01 LAB — PREGNANCY, URINE: Preg Test, Ur: NEGATIVE

## 2022-09-01 MED ORDER — KETOROLAC TROMETHAMINE 15 MG/ML IJ SOLN
15.0000 mg | Freq: Once | INTRAMUSCULAR | Status: AC
  Administered 2022-09-01: 15 mg via INTRAVENOUS
  Filled 2022-09-01: qty 1

## 2022-09-01 MED ORDER — AMLODIPINE BESYLATE 5 MG PO TABS: 5.0000 mg | ORAL_TABLET | Freq: Every day | ORAL | 0 refills | Status: DC

## 2022-09-01 MED ORDER — LABETALOL HCL 5 MG/ML IV SOLN
10.0000 mg | Freq: Once | INTRAVENOUS | Status: AC
  Administered 2022-09-01: 10 mg via INTRAVENOUS
  Filled 2022-09-01: qty 4

## 2022-09-01 MED ORDER — SODIUM CHLORIDE 0.9 % IV BOLUS
1000.0000 mL | Freq: Once | INTRAVENOUS | Status: AC
  Administered 2022-09-01: 1000 mL via INTRAVENOUS

## 2022-09-01 MED ORDER — AMLODIPINE BESYLATE 5 MG PO TABS
5.0000 mg | ORAL_TABLET | Freq: Every day | ORAL | 0 refills | Status: DC
  Filled 2022-09-01: qty 30, 30d supply, fill #0

## 2022-09-01 MED ORDER — HYDRALAZINE HCL 20 MG/ML IJ SOLN
20.0000 mg | Freq: Once | INTRAMUSCULAR | Status: AC
  Administered 2022-09-01: 20 mg via INTRAVENOUS
  Filled 2022-09-01: qty 1

## 2022-09-01 MED ORDER — HYDRALAZINE HCL 20 MG/ML IJ SOLN
10.0000 mg | Freq: Once | INTRAMUSCULAR | Status: AC
  Administered 2022-09-01: 10 mg via INTRAVENOUS
  Filled 2022-09-01: qty 1

## 2022-09-01 MED ORDER — AMLODIPINE BESYLATE 5 MG PO TABS
5.0000 mg | ORAL_TABLET | Freq: Once | ORAL | Status: AC
  Administered 2022-09-01: 5 mg via ORAL
  Filled 2022-09-01: qty 1

## 2022-09-01 MED ORDER — PROCHLORPERAZINE EDISYLATE 10 MG/2ML IJ SOLN
10.0000 mg | Freq: Once | INTRAMUSCULAR | Status: AC
  Administered 2022-09-01: 10 mg via INTRAVENOUS
  Filled 2022-09-01: qty 2

## 2022-09-01 MED ORDER — DIPHENHYDRAMINE HCL 50 MG/ML IJ SOLN
25.0000 mg | Freq: Once | INTRAMUSCULAR | Status: AC
  Administered 2022-09-01: 25 mg via INTRAVENOUS
  Filled 2022-09-01: qty 1

## 2022-09-01 MED ORDER — HYDROXYZINE HCL 25 MG PO TABS
25.0000 mg | ORAL_TABLET | Freq: Once | ORAL | Status: DC
  Filled 2022-09-01: qty 1

## 2022-09-01 NOTE — ED Triage Notes (Signed)
Pt reports numbness to left side of her bottom lip as well as her left hand, onset today at 3:45pm. She reports she had the same numbness on Sunday but it went away. Denies weakness. Pt speaking in clear complete sentences. No facial droop.  BP is 241/112 in triage. She states she took her Irbesartan and Aldactone today.

## 2022-09-01 NOTE — ED Provider Notes (Signed)
[facility] Provider Note   CSN: [id number] Arrival date & time: 09/01/22  1812     History  Chief Complaint  Patient presents with   Numbness    Rachel Dawson is a 42 y.o. female.  42 year old female with past medical history significant for hypertension presents today for evaluation of numbness to the left lower lip, as well as left hand that started earlier today and lasted for about 3 to 4 minutes.  She states this self resolved.  She states she then went to the bathroom, and as she returned from the bathroom she noticed another episode lasted about 3 to 4 minutes of numbness to her left lower lip, and left hand.  She states she had similar numbness to her left lower lip but not the hands on Sunday that lasted about 15 minutes.  She states since Sunday she has also had a headache.  Does have history of migraines.  Took a dose of rizatriptan on Sunday.  Currently reports some mild headache as well.  Denies nausea, vomiting, chest pain, shortness of breath, balance issues, vision change.  The history is provided by the patient. No language interpreter was used.       Home Medications Prior to Admission medications   Medication Sig Start Date End Date Taking? Authorizing Provider  BD PEN NEEDLE NANO 2ND GEN 32G X 4 MM MISC USE ONE NEEDLE TWICE A DAY 09/30/21   Philemon Kingdom, MD  Blood Pressure Monitoring (BLOOD PRESSURE MONITOR/L CUFF) MISC Check your blood pressure at the same time each day. 04/15/17   Billie Ruddy, MD  Continuous Blood Gluc Receiver (FREESTYLE LIBRE 2 READER) DEVI 1 each by Does not apply route daily. 09/24/21   Philemon Kingdom, MD  Continuous Blood Gluc Sensor (FREESTYLE LIBRE 2 SENSOR) MISC Use as directed & change every 14 days to check blood sugar. 06/18/22   Philemon Kingdom, MD  dapagliflozin propanediol (FARXIGA) 5 MG TABS tablet Take 1 tablet (5 mg total) by mouth daily before breakfast. 06/03/22   Philemon Kingdom, MD  fluticasone (FLONASE) 50 MCG/ACT nasal spray Place 1 spray into both nostrils daily. 08/21/22   Billie Ruddy, MD  hydrOXYzine (ATARAX) 50 MG tablet Take 1 tablet (50 mg total) by mouth 3 (three) times daily as needed for anxiety. 02/02/22   Billie Ruddy, MD  insulin degludec (TRESIBA FLEXTOUCH) 200 UNIT/ML FlexTouch Pen INJECT 28 UNITS INTO THE SKIN DAILY. 08/12/22   Philemon Kingdom, MD  irbesartan (AVAPRO) 75 MG tablet Take 1 tablet (75 mg total) by mouth daily. 08/24/22   Billie Ruddy, MD  metFORMIN (GLUCOPHAGE-XR) 500 MG 24 hr tablet TAKE 2 TABLETS BY MOUTH 2 TIMES DAILY. GIVE W/FOOD. 08/11/22   Philemon Kingdom, MD  pravastatin (PRAVACHOL) 20 MG tablet TAKE 2 TABLETS (40 MG TOTAL) BY MOUTH DAILY. 08/11/22   Philemon Kingdom, MD  pravastatin (PRAVACHOL) 40 MG tablet Take 1 tablet (40 mg total) by mouth daily. 09/25/21   Philemon Kingdom, MD  rizatriptan (MAXALT) 5 MG tablet Take 1 tablet (5 mg total) by mouth as needed for migraine. May repeat in 2 hours if needed 09/17/21   Billie Ruddy, MD  spironolactone (ALDACTONE) 25 MG tablet Take 0.5 tablets (12.5 mg total) by mouth daily. 12/10/21   Billie Ruddy, MD  Vitamin D, Ergocalciferol, (DRISDOL) 1.25 MG (50000 UNIT) CAPS capsule Take 1 capsule (50,000 Units total) by mouth every 7 (seven) days. 12/10/21   Volanda Napoleon,  Langley Adie, MD  ferrous fumarate (HEMOCYTE - 106 MG FE) 325 (106 FE) MG TABS Take 1 tablet by mouth daily.    10/28/11  [provider]  medroxyPROGESTERone (PROVERA) 10 MG tablet Take 10 mg by mouth daily.    10/28/11  [provider]      Allergies    Latex    Review of Systems   Review of Systems  Constitutional:  Negative for chills and fever.  Eyes:  Negative for visual disturbance.  Respiratory:  Negative for shortness of breath.   Cardiovascular:  Negative for chest pain.  Gastrointestinal:  Negative for abdominal pain, nausea and vomiting.  Neurological:  Positive for numbness and  headaches. Negative for weakness and light-headedness.  All other systems reviewed and are negative.   Physical Exam Updated Vital Signs BP (!) 199/111   Pulse 81   Temp 98.3 F (36.8 C) (Oral)   Resp 17   Ht '5\' 7"'$  (1.702 m)   Wt (!) 142.4 kg   LMP 01/05/2011 (Exact Date)   SpO2 97%   BMI 49.18 kg/m  Physical Exam Vitals and nursing note reviewed.  Constitutional:      General: She is not in acute distress.    Appearance: Normal appearance. She is not ill-appearing.  HENT:     Head: Normocephalic and atraumatic.     Nose: Nose normal.  Eyes:     General: No scleral icterus.    Extraocular Movements: Extraocular movements intact.     Conjunctiva/sclera: Conjunctivae normal.  Cardiovascular:     Rate and Rhythm: Normal rate and regular rhythm.     Pulses: Normal pulses.     Heart sounds: Normal heart sounds.  Pulmonary:     Effort: Pulmonary effort is normal. No respiratory distress.     Breath sounds: Normal breath sounds. No wheezing or rales.  Abdominal:     General: There is no distension.     Tenderness: There is no abdominal tenderness.  Musculoskeletal:        General: Normal range of motion.     Cervical back: Normal range of motion.  Skin:    General: Skin is warm and dry.  Neurological:     General: No focal deficit present.     Mental Status: She is alert and oriented to person, place, and time. Mental status is at baseline.     Cranial Nerves: No cranial nerve deficit.     Comments: Cranial nerves III through XII intact.  Without facial asymmetry.  Normal speech.  Tongue midline.  Without pronator drift.  5/5 strength in extensor and flexor muscle groups of bilateral upper and lower extremity.  Full range of motion bilateral upper and lower extremities.  Sensation intact and symmetrical bilaterally over the face, upper and lower extremities.     ED Results / Procedures / Treatments   Labs (all labs ordered are listed, but only abnormal results are  displayed) Labs Reviewed  URINALYSIS, ROUTINE W REFLEX MICROSCOPIC  PREGNANCY, URINE  BASIC METABOLIC PANEL  CBC  CBG MONITORING, ED  TROPONIN I (HIGH SENSITIVITY)    EKG EKG Interpretation  Date/Time:  Tuesday September 01 2022 18:22:52 EST Ventricular Rate:  70 PR Interval:  154 QRS Duration: 89 QT Interval:  385 QTC Calculation: 416 R Axis:   84 Text Interpretation: Sinus rhythm Low voltage, precordial leads Confirmed by Ronnald Nian, Adam ([id number]) on 09/01/2022 6:37:10 PM  Radiology No results found.  Procedures Procedures    Medications Ordered in  ED Medications  prochlorperazine (COMPAZINE) injection 10 mg (has no administration in time range)  diphenhydrAMINE (BENADRYL) injection 25 mg (has no administration in time range)  sodium chloride 0.9 % bolus 1,000 mL (has no administration in time range)    ED Course/ Medical Decision Making/ A&P                             Medical Decision Making Amount and/or Complexity of Data Reviewed Labs: ordered. Radiology: ordered.  Risk Prescription drug management.   Medical Decision Making / ED Course   This patient presents to the ED for concern of numbness, headache, this involves an extensive number of treatment options, and is a complaint that carries with it a high risk of complications and morbidity.  The differential diagnosis includes CVA, TIA, complex migraine  MDM: 42 year old female with history of hypertension presents today for evaluation of numbness to her left lower lip, left hand.  Currently she is symptom-free.  Does complain of headache however.  This is mild.  Does have history of migraines.  Did take her home antihypertensives today.  Denies any signs or symptoms that would raise suspicion for hypertensive emergency.  Will obtain CT head, blood work.  Will give dose of Benadryl, Compazine.  Following CT head if there is no acute process will give Toradol, hydralazine.  CT head without acute process.   Toradol, hydralazine, 5 mg Norvasc ordered.  Will reevaluate.  Following as needed medications in addition to Norvasc in the emergency department BP improved.  Headache improved following migraine cocktail.  Does not currently have a neurologist.  Will give neurology referral for migraine follow-up.  Discussed keeping a blood pressure diary and following up with PCP.  Strict return precaution discussed that would raise suspicion for endorgan damage in setting of elevated blood pressure.  Patient voices understanding and is in agreement with plan.  No evidence of acute CVA.  Patient is appropriate for discharge.  Discharged in stable condition.  Return precaution discussed.  Patient discussed with attending.  Lab Tests: -I ordered, reviewed, and interpreted labs.   The pertinent results include:   Labs Reviewed  BASIC METABOLIC PANEL - Abnormal; Notable for the following components:      Result Value   Glucose, Bld 106 (*)    All other components within normal limits  CBC  URINALYSIS, ROUTINE W REFLEX MICROSCOPIC  PREGNANCY, URINE  CBG MONITORING, ED  TROPONIN I (HIGH SENSITIVITY)      EKG  EKG Interpretation  Date/Time:  Tuesday September 01 2022 18:22:52 EST Ventricular Rate:  70 PR Interval:  154 QRS Duration: 89 QT Interval:  385 QTC Calculation: 416 R Axis:   84 Text Interpretation: Sinus rhythm Low voltage, precordial leads Confirmed by Lennice Sites ([id number]) on 09/01/2022 6:37:10 PM         Imaging Studies ordered: I ordered imaging studies including CT head without contrast I independently visualized and interpreted imaging. I agree with the radiologist interpretation   Medicines ordered and prescription drug management: Meds ordered this encounter  Medications   prochlorperazine (COMPAZINE) injection 10 mg   diphenhydrAMINE (BENADRYL) injection 25 mg   sodium chloride 0.9 % bolus 1,000 mL   ketorolac (TORADOL) 15 MG/ML injection 15 mg   hydrALAZINE (APRESOLINE)  injection 10 mg   amLODipine (NORVASC) tablet 5 mg    -I have reviewed the patients home medicines and have made adjustments as needed  Critical interventions As  needed antihypertensives, Norvasc   Cardiac Monitoring: The patient was maintained on a cardiac monitor.  I personally viewed and interpreted the cardiac monitored which showed an underlying rhythm of: Normal sinus rhythm to sinus tachycardia  Reevaluation: After the interventions noted above, I reevaluated the patient and found that they have :improved  Co morbidities that complicate the patient evaluation  Past Medical History:  Diagnosis Date   Anemia    Fatty liver    History of blood transfusion    "when I had tonsils taken out & w/hyster"   Hypercholesterolemia    Hypertension    Menometrorrhagia    Microcytic anemia    Type II diabetes mellitus ([place])    Vitamin D deficiency       Dispostion: Patient is appropriate for discharge.  Discharged in stable condition.  Return precaution discussed.  Patient voices understanding and is in agreement with the plan.  Final Clinical Impression(s) / ED Diagnoses Final diagnoses:  Hypertensive urgency  Migraine without status migrainosus, not intractable, unspecified migraine type    Rx / DC Orders ED Discharge Orders          Ordered    amLODipine (NORVASC) 5 MG tablet  Daily        09/01/22 2241    Ambulatory referral to Neurology       Comments: An appointment is requested in approximately: 2 weeks   09/01/22 2243              Evlyn Courier, PA-C 09/01/22 2243    Lennice Sites, DO 09/01/22 2303

## 2022-09-01 NOTE — Discharge Instructions (Signed)
Your workup today was overall reassuring.  Given your elevated blood pressure we have started you on Norvasc 5 mg.  Take this as prescribed.  Continue taking your other home blood pressure medications.  Keep a blood pressure diary and follow-up with your primary care provider for any additional adjustments to your blood pressure.  No evidence of stroke today.  CT scan did not show any concerning findings.  Your numbness episodes could be part of your migraine syndrome.  I have given you referral to neurology to follow-up and establish care regarding this.  For any concerning symptoms such as chest pain, shortness of breath, balance issues, vision change, weakness, speech difficulty, facial droop please return to the emergency department immediately.

## 2022-09-01 NOTE — Progress Notes (Signed)
Pt feeling anxious and asked for a dose of her home anxiety medication hydroxyzine. Medication was ordered by provider and when taken to pt she was resting and feeling less anxious and asked if we could hold off on given the hydroxyzine at this time since she's now feeling less anxious.

## 2022-09-02 ENCOUNTER — Other Ambulatory Visit (HOSPITAL_COMMUNITY): Payer: Self-pay

## 2022-09-03 ENCOUNTER — Encounter: Payer: Self-pay | Admitting: Family Medicine

## 2022-09-03 ENCOUNTER — Ambulatory Visit (INDEPENDENT_AMBULATORY_CARE_PROVIDER_SITE_OTHER): Payer: Commercial Managed Care - PPO | Admitting: Family Medicine

## 2022-09-03 VITALS — BP 164/104 | HR 71 | Temp 98.8°F | Ht 67.0 in | Wt 308.8 lb

## 2022-09-03 DIAGNOSIS — R0683 Snoring: Secondary | ICD-10-CM

## 2022-09-03 DIAGNOSIS — F419 Anxiety disorder, unspecified: Secondary | ICD-10-CM

## 2022-09-03 DIAGNOSIS — R202 Paresthesia of skin: Secondary | ICD-10-CM

## 2022-09-03 DIAGNOSIS — I10 Essential (primary) hypertension: Secondary | ICD-10-CM

## 2022-09-03 LAB — VITAMIN B12: Vitamin B-12: 168 pg/mL — ABNORMAL LOW (ref 211–911)

## 2022-09-03 LAB — TSH: TSH: 1.04 u[IU]/mL (ref 0.35–5.50)

## 2022-09-03 LAB — T4, FREE: Free T4: 0.91 ng/dL (ref 0.60–1.60)

## 2022-09-03 MED ORDER — AMLODIPINE BESYLATE 5 MG PO TABS: 5.0000 mg | ORAL_TABLET | Freq: Every day | ORAL | 1 refills | Status: DC

## 2022-09-03 MED ORDER — IRBESARTAN 75 MG PO TABS
75.0000 mg | ORAL_TABLET | Freq: Every day | ORAL | 1 refills | Status: DC
  Filled 2022-09-30: qty 90, 90d supply, fill #0
  Filled 2023-01-08 – 2023-01-19 (×3): qty 90, 90d supply, fill #1

## 2022-09-03 MED ORDER — SPIRONOLACTONE 25 MG PO TABS
25.0000 mg | ORAL_TABLET | Freq: Every day | ORAL | 3 refills | Status: DC
  Filled 2022-09-30: qty 90, 90d supply, fill #0

## 2022-09-03 NOTE — Progress Notes (Signed)
Established Patient Office Visit   Subjective  Patient ID: Rachel Dawson, female    DOB: 12/31/1980  Age: 42 y.o. MRN: [id number]  Chief Complaint  Patient presents with   Medical Management of Chronic Issues    Pt is accompanied by son, following up with ER visit on 1/23 for high BP and numbness on face and hands. Pt reports she is still feeling numbness on lower lip and hands. Taking amlodipine '5mg'$  from ER. Denied having headache.   Patient is accompanied by her son.  Patient is a 42 year old female who is seen for ED follow-up.  Patient states she was seen in ED 09/01/2022 for hypertensive urgency and migraine.  Patient went to ED 2/2 waking up with numbness in left side of face and left hand.  Workup in ED including CT head negative.  Patient given Norvasc 5 mg to start in addition to her regular medications.  Since being home patient endorses increased anxiety surrounding her blood pressure and picking her son up from school.  States prior to ED visit a day or 2 of medication as needed a refill and then breast surgery.  Patient denies changes in diet, supplements, or other medications.  Patient was taking irbesartan 75 mg a tab and a half daily along with spironolactone 12.5 mg daily.  BP this morning 175/78.  Patient denies headache, LE edema, CP.  Numbness in hand has resolved but still present left angle of mouth.  Used hydroxyzine recently for anxiety.  Was in counseling but stopped a few months ago.  Patient's son confirms patient snores.  Patient states recently she woke herself up from snoring/gasping.  Sleeping on back more status post breast surgery.      Review of Systems  Neurological:  Positive for tingling.  Psychiatric/Behavioral:  The patient is nervous/anxious.    Negative unless stated above    Objective:     BP (!) 164/104 (BP Location: Right Arm, Cuff Size: Large)   Pulse 71   Temp 98.8 F (37.1 C) (Oral)   Ht '5\' 7"'$  (1.702 m)   Wt (!) 308 lb 12.8 oz (140.1  kg)   LMP 01/05/2011 (Exact Date)   SpO2 95%   BMI 48.36 kg/m    Physical Exam Constitutional:      Appearance: Normal appearance.  HENT:     Head: Normocephalic and atraumatic.     Nose: Nose normal.     Mouth/Throat:     Mouth: Mucous membranes are moist.  Eyes:     Extraocular Movements: Extraocular movements intact.     Conjunctiva/sclera: Conjunctivae normal.     Pupils: Pupils are equal, round, and reactive to light.  Cardiovascular:     Rate and Rhythm: Normal rate and regular rhythm.     Heart sounds: Normal heart sounds.  Pulmonary:     Effort: Pulmonary effort is normal.     Breath sounds: Normal breath sounds. No wheezing, rhonchi or rales.  Skin:    General: Skin is warm.  Neurological:     Mental Status: She is alert and oriented to person, place, and time. Mental status is at baseline.  Psychiatric:        Mood and Affect: Mood is anxious.        Behavior: Behavior normal.        Thought Content: Thought content normal.        09/03/2022    9:13 AM 12/09/2020    9:20 AM  GAD 7 : Generalized Anxiety  Score  Nervous, Anxious, on Edge 1 0  Control/stop worrying 0 0  Worry too much - different things 0 0  Trouble relaxing 1 0  Restless 0 0  Easily annoyed or irritable 0 1  Afraid - awful might happen 0 0  Total GAD 7 Score 2 1  Anxiety Difficulty Not difficult at all        09/03/2022    9:13 AM 12/10/2021   10:10 AM 11/05/2021   11:11 AM  Depression screen PHQ 2/9  Decreased Interest 0 0 0  Down, Depressed, Hopeless 0 0 0  PHQ - 2 Score 0 0 0  Altered sleeping 0 0 0  Tired, decreased energy 0 0 0  Change in appetite 0 0 0  Feeling bad or failure about yourself  0 0 0  Trouble concentrating 0 0 0  Moving slowly or fidgety/restless 0 0 0  Suicidal thoughts 0 0 0  PHQ-9 Score 0 0 0  Difficult doing work/chores Not difficult at all  Not difficult at all    No results found for any visits on 09/03/22.    Assessment & Plan:  Essential  hypertension -uncontrolled -increased spironolactone from 12.5 mg to 25 mg daily -Continue irbesartan 75 mg and Norvasc 5 mg daily.   -Patient encouraged to monitor BP at home and keep a log to bring with her to clinic. -Discussed obtaining sleep study to evaluate for possible OSA. -Continue lifestyle modifications -Given strict precautions -     Ambulatory referral to Pulmonology -     Spironolactone; Take 1 tablet (25 mg total) by mouth daily.  Dispense: 90 tablet; Refill: 3 -     TSH -     T4, free -     amLODIPine Besylate; Take 1 tablet (5 mg total) by mouth daily.  Dispense: 90 tablet; Refill: 1 -     Irbesartan; Take 1 tablet (75 mg total) by mouth daily.  Dispense: 90 tablet; Refill: 1  Tingling of face -Calcium and potassium normal on 09/01/2022 in ED -continue follow-up with neurology as may be a change in migraine -     Vitamin B12 -     TSH -     T4, free  Snoring -     Ambulatory referral to Pulmonology  Anxiety -GAD-7 score 2 this visit -Discussed restarting counseling -Hydroxyzine as needed -Continue to monitor   Return in about 4 weeks (around 10/01/2022) for blood pressure re-check.   Billie Ruddy, MD

## 2022-09-08 NOTE — Progress Notes (Unsigned)
Referring:  Evlyn Courier, PA-C [address],  [place] [address]  PCP: Billie Ruddy, MD  Neurology was asked to evaluate Rachel Dawson, a 42 year old female for a chief complaint of headaches.  Our recommendations of care will be communicated by shared medical record.    CC:  headaches  History provided from self  HPI:  Medical co-morbidities: HTN, DM2, HLD  The patient presents for evaluation of headaches and numbness. ~10 days ago she woke up with a bad headache. Headache was associated with photophobia and phonophobia. Took Maxalt, then soon after this she developed face numbness and paresthesias. Numbness lasted for 12 minutes then resolved on its own. She then developed a second episode of hand and face numbness a couple of days later (09/01/22), which was not associated with a headache. She presented to the ED where BP was 199/111. CTH was unremarkable. The headache and numbness in her hand resolved, but she has had residual numbness/tingling around the edge of her mouth on the left which has persisted.  Notes she did have COVID ~3 weeks ago and had a bad headache with it.  She does report a history of migraines which typically resolve with Maxalt. She has never had a migraine aura prior to this.  Headache History: Onset: 10 days ago Aura: left hand and face numbness Quality/Description: throbbing Associated Symptoms:  Photophobia: yes  Phonophobia: yes  Nausea: no Worse with activity?: yes  Migraine days per month: 1 Headache free days per month: 29  Current Treatment: Abortive Maxalt 10 mg PRN  Preventative none  Prior Therapies                                 Maxalt 10 mg PRN Irbesartan 75 mg daily Lisinopril - dry mouth   LABS: CBC    Component Value Date/Time   WBC 7.9 09/01/2022 1825   RBC 4.70 09/01/2022 1825   HGB 12.3 09/01/2022 1825   HGB 12.6 11/02/2019 1645   HGB 9.8 (L) 03/03/2011 1309   HCT 38.7 09/01/2022 1825   HCT 39.3  11/02/2019 1645   HCT 31.8 (L) 03/03/2011 1309   PLT 342 09/01/2022 1825   PLT 309 11/02/2019 1645   MCV 82.3 09/01/2022 1825   MCV 85 11/02/2019 1645   MCV 77.8 (L) 03/03/2011 1309   MCH 26.2 09/01/2022 1825   MCHC 31.8 09/01/2022 1825   RDW 13.7 09/01/2022 1825   RDW 13.5 11/02/2019 1645   RDW 16.7 (H) 03/03/2011 1309   LYMPHSABS 1.6 12/10/2021 1115   LYMPHSABS 1.8 11/02/2019 1645   LYMPHSABS 1.7 03/03/2011 1309   MONOABS 0.3 12/10/2021 1115   MONOABS 0.3 03/03/2011 1309   EOSABS 0.1 12/10/2021 1115   EOSABS 0.2 11/02/2019 1645   BASOSABS 0.0 12/10/2021 1115   BASOSABS 0.0 11/02/2019 1645   BASOSABS 0.0 03/03/2011 1309      Latest Ref Rng & Units 09/01/2022    6:25 PM 12/10/2021   11:15 AM 09/24/2021   11:20 AM  CMP  Glucose 70 - 99 mg/dL 106  127  88   BUN 6 - 20 mg/dL '14  15  13   '$ Creatinine 0.44 - 1.00 mg/dL 0.89  0.87  0.83   Sodium 135 - 145 mmol/L 136  137  140   Potassium 3.5 - 5.1 mmol/L 3.7  3.9  4.2   Chloride 98 - 111 mmol/L 104  104  104   CO2 22 - 32 mmol/L '24  26  29   '$ Calcium 8.9 - 10.3 mg/dL 9.2  9.3  9.3   Total Protein 6.0 - 8.3 g/dL  7.5  8.0   Total Bilirubin 0.2 - 1.2 mg/dL  0.4  0.4   Alkaline Phos 39 - 117 U/L  76  78   AST 0 - 37 U/L  13  10   ALT 0 - 35 U/L  13  10      IMAGING:  [facility] 09/01/22: unremarkable  Imaging independently reviewed on September 09, 2022   Current Outpatient Medications on File Prior to Visit  Medication Sig Dispense Refill   amLODipine (NORVASC) 5 MG tablet Take 1 tablet (5 mg total) by mouth daily. 90 tablet 1   BD PEN NEEDLE NANO 2ND GEN 32G X 4 MM MISC USE ONE NEEDLE TWICE A DAY 200 each 3   Blood Pressure Monitoring (BLOOD PRESSURE MONITOR/L CUFF) MISC Check your blood pressure at the same time each day. 1 each 0   Continuous Blood Gluc Receiver (FREESTYLE LIBRE 2 READER) DEVI 1 each by Does not apply route daily. 1 each 0   Continuous Blood Gluc Sensor (FREESTYLE LIBRE 2 SENSOR) MISC Use as directed & change every  14 days to check blood sugar. 6 each 3   dapagliflozin propanediol (FARXIGA) 5 MG TABS tablet Take 1 tablet (5 mg total) by mouth daily before breakfast. 90 tablet 3   fluticasone (FLONASE) 50 MCG/ACT nasal spray Place 1 spray into both nostrils daily. 16 g 1   hydrOXYzine (ATARAX) 50 MG tablet Take 1 tablet (50 mg total) by mouth 3 (three) times daily as needed for anxiety. 60 tablet 3   insulin degludec (TRESIBA FLEXTOUCH) 200 UNIT/ML FlexTouch Pen INJECT 28 UNITS INTO THE SKIN DAILY. 9 mL 0   irbesartan (AVAPRO) 75 MG tablet Take 1 tablet (75 mg total) by mouth daily. 90 tablet 1   metFORMIN (GLUCOPHAGE-XR) 500 MG 24 hr tablet TAKE 2 TABLETS BY MOUTH 2 TIMES DAILY. GIVE W/FOOD. 360 tablet 1   pravastatin (PRAVACHOL) 40 MG tablet Take 1 tablet (40 mg total) by mouth daily. 90 tablet 3   rizatriptan (MAXALT) 5 MG tablet Take 1 tablet (5 mg total) by mouth as needed for migraine. May repeat in 2 hours if needed 10 tablet 3   spironolactone (ALDACTONE) 25 MG tablet Take 1 tablet (25 mg total) by mouth daily. 90 tablet 3   pravastatin (PRAVACHOL) 20 MG tablet TAKE 2 TABLETS (40 MG TOTAL) BY MOUTH DAILY. 180 tablet 1   Vitamin D, Ergocalciferol, (DRISDOL) 1.25 MG (50000 UNIT) CAPS capsule Take 1 capsule (50,000 Units total) by mouth every 7 (seven) days. 12 capsule 0   [DISCONTINUED] ferrous fumarate (HEMOCYTE - 106 MG FE) 325 (106 FE) MG TABS Take 1 tablet by mouth daily.       [DISCONTINUED] medroxyPROGESTERone (PROVERA) 10 MG tablet Take 10 mg by mouth daily.       No current facility-administered medications on file prior to visit.     Allergies: Allergies  Allergen Reactions   Latex Rash    Family History: Family History  Adopted: Yes  Problem Relation Age of Onset   Hypertension Daughter      Past Medical History: Past Medical History:  Diagnosis Date   Anemia    Fatty liver    History of blood transfusion    "when I had tonsils taken out & w/hyster"   Hypercholesterolemia  Hypertension    Menometrorrhagia    Microcytic anemia    Type II diabetes mellitus ([place])    Vitamin D deficiency     Past Surgical History Past Surgical History:  Procedure Laterality Date   ABDOMINAL HYSTERECTOMY  ~ 2012   [place]; 2004; 2006   HIP PINNING Bilateral ~ 1991   "balls had dropped out of their sockets"   [place]  2006    Social History: Social History   Tobacco Use   Smoking status: Former    Packs/day: 0.12    Years: 4.00    Total pack years: 0.48    Types: Cigarettes    Quit date: 07/10/2014    Years since quitting: 8.1   Smokeless tobacco: Never  Vaping Use   Vaping Use: Never used  Substance Use Topics   Alcohol use: Yes    Comment: occ   Drug use: No    ROS: Negative for fevers, chills. Positive for headaches, numbness. All other systems reviewed and negative unless stated otherwise in HPI.   Physical Exam:   Vital Signs: BP (!) 200/110   Pulse 84   Ht '5\' 7"'$  (1.702 m)   Wt (!) 309 lb 8 oz (140.4 kg)   LMP 01/05/2011 (Exact Date)   BMI 48.47 kg/m  GENERAL: well appearing,in no acute distress,alert SKIN:  Color, texture, turgor normal. No rashes or lesions HEAD:  Normocephalic/atraumatic. CV:  RRR RESP: Normal respiratory effort MSK: no tenderness to palpation over occiput, neck, or shoulders  NEUROLOGICAL: Mental Status: Alert, oriented to person, place and time,Follows commands Cranial Nerves: PERRL, visual fields intact to confrontation, extraocular movements intact, facial sensation intact, no facial droop or ptosis, hearing grossly intact, no dysarthria Motor: muscle strength 5/5 both upper and lower extremities,no drift, normal tone Reflexes: 2+ throughout Sensation: intact to light touch all 4 extremities Coordination: Finger-to- nose-finger intact bilaterally Gait: normal-based   IMPRESSION: 42 year old female with a history of HTN, DM2, HLD who presents for  evaluation of headaches and face/hand numbness. While her headache has resolved, she does continue to have numbness and paresthesias on the left side of her face which have persisted for the past week. Will order brain MRI to assess for underlying structural causes of persistent numbness. If imaging is normal, suspect this is secondary to a sensory migraine aura and will plan to treat for persistent migraine aura if symptoms persist.  PLAN: -MRI brain -Next steps: consider medrol dosepak, lamictal for persistent migraine aura if imaging is normal   I spent a total of 36 minutes chart reviewing and counseling the patient. Headache education was done. Discussed treatment options including preventive and acute medications. Written educational materials and patient instructions outlining all of the above were given.  Follow-up: 6 months, or sooner if needed   Genia Harold, MD 09/09/2022   3:01 PM

## 2022-09-09 ENCOUNTER — Ambulatory Visit (INDEPENDENT_AMBULATORY_CARE_PROVIDER_SITE_OTHER): Payer: Commercial Managed Care - PPO | Admitting: Psychiatry

## 2022-09-09 ENCOUNTER — Encounter: Payer: Self-pay | Admitting: Psychiatry

## 2022-09-09 ENCOUNTER — Ambulatory Visit (INDEPENDENT_AMBULATORY_CARE_PROVIDER_SITE_OTHER): Payer: Commercial Managed Care - PPO | Admitting: *Deleted

## 2022-09-09 VITALS — BP 200/110 | HR 84 | Ht 67.0 in | Wt 309.5 lb

## 2022-09-09 DIAGNOSIS — R519 Headache, unspecified: Secondary | ICD-10-CM

## 2022-09-09 DIAGNOSIS — R29818 Other symptoms and signs involving the nervous system: Secondary | ICD-10-CM | POA: Diagnosis not present

## 2022-09-09 DIAGNOSIS — E538 Deficiency of other specified B group vitamins: Secondary | ICD-10-CM

## 2022-09-09 MED ORDER — CYANOCOBALAMIN 1000 MCG/ML IJ SOLN
1000.0000 ug | Freq: Once | INTRAMUSCULAR | Status: AC
  Administered 2022-09-09: 1000 ug via INTRAMUSCULAR

## 2022-09-09 MED ORDER — LORAZEPAM 0.5 MG PO TABS: ORAL_TABLET | ORAL | 0 refills | Status: DC

## 2022-09-09 NOTE — Progress Notes (Signed)
Per orders of Dr. Banks, injection of Cyanocobalamin 1000mcg given by Othello Dickenson A. °Patient tolerated injection well. ° °

## 2022-09-10 ENCOUNTER — Encounter: Payer: Self-pay | Admitting: Family Medicine

## 2022-09-10 DIAGNOSIS — I10 Essential (primary) hypertension: Secondary | ICD-10-CM

## 2022-09-11 ENCOUNTER — Telehealth: Payer: Self-pay | Admitting: Psychiatry

## 2022-09-11 NOTE — Telephone Encounter (Signed)
[employer] sent to [facility] they obtain auth [id number] 

## 2022-09-17 ENCOUNTER — Ambulatory Visit (INDEPENDENT_AMBULATORY_CARE_PROVIDER_SITE_OTHER): Payer: Commercial Managed Care - PPO | Admitting: Nurse Practitioner

## 2022-09-17 ENCOUNTER — Encounter: Payer: Self-pay | Admitting: Nurse Practitioner

## 2022-09-17 VITALS — BP 124/80 | HR 69 | Temp 98.5°F | Ht 67.0 in | Wt 313.2 lb

## 2022-09-17 DIAGNOSIS — R0683 Snoring: Secondary | ICD-10-CM | POA: Diagnosis not present

## 2022-09-17 DIAGNOSIS — Z6841 Body Mass Index (BMI) 40.0 and over, adult: Secondary | ICD-10-CM | POA: Diagnosis not present

## 2022-09-17 NOTE — Progress Notes (Signed)
$'@Patient'h$  ID: Rachel Dawson, female    DOB: [birth year], 42 y.o.   MRN: [id number]  Chief Complaint  Patient presents with   sleep consult    Pt states she has been told that she does snore at times. Some mornings when she wakes up she does feel like she has slept but some mornings she does not feel rested.    Referring provider: Billie Ruddy, MD  HPI: 42 year old female, former smoker referred for sleep consult.  Past medical history significant for hypertension, DM 2, obesity, anxiety.  TEST/EVENTS:   09/17/2022: Today - sleep consult Patient presents today for sleep consult referred by Dr. Volanda Napoleon. She has had trouble trying to control her diabetes and is on insulin, farxiga and metformin. She has also had elevated BP readings and was started on antihypertensives. Her PCP was concerned that with this, her weight, and her snoring, she could have underlying sleep apnea so wanted her to have further workup. She tells me today that she sleeps well for the most part. She does wake up some mornings feeling tired but doesn't usually feel tired during the day. She has worked almost 7 days a week for a lot of her life so she's unsure if she's just too busy to notice that she is tired. She wakes up a few times a night to use the restroom. She has also been told by her son that he can hear her snoring from the other room some nights. She occasionally wakes with morning headaches and dry mouth. She denies any drowsy driving, sleep parasomnias/paralysis. No history of narcolepsy or symptoms of cataplexy.  She goes to bed between 830-9 pm. Falls asleep in 20-30 minutes. Wakes 2-3 times a night. Wake time varies depending on her work schedule but usually up around 7 am. She does not take any sleep aids. 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 Weight has fluctuated up and down 5-10 lb over the past two years. She has never had a previous sleep study. She has a history of HTN, DM. No history  of stroke. She had a hysterectomy in 2015. She is a former smoker, quit in 2019. She rarely drinks alcohol. She lives at home with her children. Works as a Estate manager/land agent. No significant family history.   [place] 3  Allergies  Allergen Reactions   Latex Rash    Immunization History  Administered Date(s) Administered   Influenza,inj,Quad PF,6+ Mos 04/15/2017   Influenza-Unspecified 05/24/2021   PFIZER(Purple Top)SARS-COV-2 Vaccination 05/19/2020, 06/09/2020, 11/22/2020   Pneumococcal-Unspecified 06/10/2014   Tdap 04/15/2017, 07/01/2020   Unspecified SARS-COV-2 Vaccination 05/19/2020    Past Medical History:  Diagnosis Date   Anemia    Fatty liver    History of blood transfusion    "when I had tonsils taken out & w/hyster"   Hypercholesterolemia    Hypertension    Menometrorrhagia    Microcytic anemia    Type II diabetes mellitus ([facility])    Vitamin D deficiency     Tobacco History: Social History   Tobacco Use  Smoking Status Former   Packs/day: 0.12   Years: 4.00   Total pack years: 0.48   Types: Cigarettes   Quit date: 07/10/2014   Years since quitting: 8.1  Smokeless Tobacco Never   Counseling given: Not Answered   Outpatient Medications Prior to Visit  Medication Sig Dispense Refill   amLODipine (NORVASC) 5 MG tablet Take 1 tablet (5 mg total) by mouth daily. 90 tablet  1   BD PEN NEEDLE NANO 2ND GEN 32G X 4 MM MISC USE ONE NEEDLE TWICE A DAY 200 each 3   Blood Pressure Monitoring (BLOOD PRESSURE MONITOR/L CUFF) MISC Check your blood pressure at the same time each day. 1 each 0   Continuous Blood Gluc Receiver (FREESTYLE LIBRE 2 READER) DEVI 1 each by Does not apply route daily. 1 each 0   Continuous Blood Gluc Sensor (FREESTYLE LIBRE 2 SENSOR) MISC Use as directed & change every 14 days to check blood sugar. 6 each 3   dapagliflozin propanediol (FARXIGA) 5 MG TABS tablet Take 1 tablet (5 mg total) by mouth daily before breakfast. 90 tablet 3   fluticasone  (FLONASE) 50 MCG/ACT nasal spray Place 1 spray into both nostrils daily. 16 g 1   hydrOXYzine (ATARAX) 50 MG tablet Take 1 tablet (50 mg total) by mouth 3 (three) times daily as needed for anxiety. 60 tablet 3   insulin degludec (TRESIBA FLEXTOUCH) 200 UNIT/ML FlexTouch Pen INJECT 28 UNITS INTO THE SKIN DAILY. 9 mL 0   irbesartan (AVAPRO) 75 MG tablet Take 1 tablet (75 mg total) by mouth daily. 90 tablet 1   metFORMIN (GLUCOPHAGE-XR) 500 MG 24 hr tablet TAKE 2 TABLETS BY MOUTH 2 TIMES DAILY. GIVE W/FOOD. 360 tablet 1   pravastatin (PRAVACHOL) 40 MG tablet Take 1 tablet (40 mg total) by mouth daily. 90 tablet 3   rizatriptan (MAXALT) 5 MG tablet Take 1 tablet (5 mg total) by mouth as needed for migraine. May repeat in 2 hours if needed 10 tablet 3   spironolactone (ALDACTONE) 25 MG tablet Take 1 tablet (25 mg total) by mouth daily. 90 tablet 3   LORazepam (ATIVAN) 0.5 MG tablet Take 1-2 pills 30 minutes prior to MRI (Patient not taking: Reported on 09/17/2022) 2 tablet 0   pravastatin (PRAVACHOL) 20 MG tablet TAKE 2 TABLETS (40 MG TOTAL) BY MOUTH DAILY. 180 tablet 1   No facility-administered medications prior to visit.     Review of Systems:   Constitutional: No weight loss or gain, night sweats, fevers, chills, or lassitude. +occasional AM tiredness HEENT: No headaches, difficulty swallowing, tooth/dental problems, or sore throat. No sneezing, itching, ear ache, nasal congestion, or post nasal drip CV:  No chest pain, orthopnea, PND, swelling in lower extremities, anasarca, dizziness, palpitations, syncope Resp: +snoring. No shortness of breath with exertion or at rest. No excess mucus or change in color of mucus. No productive or non-productive. No hemoptysis. No wheezing.  No chest wall deformity GI:  No heartburn, indigestion, abdominal pain, nausea, vomiting, diarrhea, change in bowel habits, loss of appetite, bloody stools.  GU: +nocturia. No dysuria, change in color of urine, urgency or  frequency.  No flank pain, no hematuria  Skin: No rash, lesions, ulcerations MSK:  No joint pain or swelling.   Neuro: No dizziness or lightheadedness.  Psych: No depression. +anxiety (stable). Mood stable.     Physical Exam:  BP 124/80 (BP Location: Right Arm, Patient Position: Sitting, Cuff Size: Large)   Pulse 69   Temp 98.5 F (36.9 C) (Oral)   Ht '5\' 7"'$  (1.702 m)   Wt (!) 313 lb 3.2 oz (142.1 kg)   LMP 01/05/2011 (Exact Date)   SpO2 98% Comment: RA  BMI 49.05 kg/m   GEN: Pleasant, interactive, well-appearing; morbidly obese; in no acute distress. HEENT:  Normocephalic and atraumatic. PERRLA. Sclera white. Nasal turbinates pink, moist and patent bilaterally. No rhinorrhea present. Oropharynx pink and moist, without  exudate or edema. No lesions, ulcerations, or postnasal drip. Mallampati III NECK:  Supple w/ fair ROM. No JVD present. Normal carotid impulses w/o bruits. Thyroid symmetrical with no goiter or nodules palpated. No lymphadenopathy.   CV: RRR, no m/r/g, no peripheral edema. Pulses intact, +2 bilaterally. No cyanosis, pallor or clubbing. PULMONARY:  Unlabored, regular breathing. Clear bilaterally A&P w/o wheezes/rales/rhonchi. No accessory muscle use.  GI: BS present and normoactive. Soft, non-tender to palpation. No organomegaly or masses detected.  MSK: No erythema, warmth or tenderness. Cap refil <2 sec all extrem. No deformities or joint swelling noted.  Neuro: A/Ox3. No focal deficits noted.   Skin: Warm, no lesions or rashe Psych: Normal affect and behavior. Judgement and thought content appropriate.     Lab Results:  CBC    Component Value Date/Time   WBC 7.9 09/01/2022 1825   RBC 4.70 09/01/2022 1825   HGB 12.3 09/01/2022 1825   HGB 12.6 11/02/2019 1645   HGB 9.8 (L) 03/03/2011 1309   HCT 38.7 09/01/2022 1825   HCT 39.3 11/02/2019 1645   HCT 31.8 (L) 03/03/2011 1309   PLT 342 09/01/2022 1825   PLT 309 11/02/2019 1645   MCV 82.3 09/01/2022 1825    MCV 85 11/02/2019 1645   MCV 77.8 (L) 03/03/2011 1309   MCH 26.2 09/01/2022 1825   MCHC 31.8 09/01/2022 1825   RDW 13.7 09/01/2022 1825   RDW 13.5 11/02/2019 1645   RDW 16.7 (H) 03/03/2011 1309   LYMPHSABS 1.6 12/10/2021 1115   LYMPHSABS 1.8 11/02/2019 1645   LYMPHSABS 1.7 03/03/2011 1309   MONOABS 0.3 12/10/2021 1115   MONOABS 0.3 03/03/2011 1309   EOSABS 0.1 12/10/2021 1115   EOSABS 0.2 11/02/2019 1645   BASOSABS 0.0 12/10/2021 1115   BASOSABS 0.0 11/02/2019 1645   BASOSABS 0.0 03/03/2011 1309    BMET    Component Value Date/Time   NA 136 09/01/2022 1825   NA 138 11/02/2019 1645   K 3.7 09/01/2022 1825   CL 104 09/01/2022 1825   CO2 24 09/01/2022 1825   GLUCOSE 106 (H) 09/01/2022 1825   BUN 14 09/01/2022 1825   BUN 21 11/02/2019 1645   CREATININE 0.89 09/01/2022 1825   CALCIUM 9.2 09/01/2022 1825   GFRNONAA >60 09/01/2022 1825   GFRAA 98 11/02/2019 1645    BNP No results found for: "BNP"   Imaging:  CT Head Wo Contrast  Result Date: 09/01/2022 CLINICAL DATA:  Left hand and left-sided bottom lip numbness. EXAM: CT HEAD WITHOUT CONTRAST TECHNIQUE: Contiguous axial images were obtained from the base of the skull through the vertex without intravenous contrast. RADIATION DOSE REDUCTION: This exam was performed according to the departmental dose-optimization program which includes automated exposure control, adjustment of the mA and/or kV according to patient size and/or use of iterative reconstruction technique. COMPARISON:  August 05, 2018 FINDINGS: Brain: No evidence of acute infarction, hemorrhage, hydrocephalus, extra-axial collection or mass lesion/mass effect. Vascular: No hyperdense vessel or unexpected calcification. Skull: Normal. Negative for fracture or focal lesion. Sinuses/Orbits: There is mild left maxillary sinus mucosal thickening. Other: None. IMPRESSION: 1. No acute intracranial process. MRI correlation is recommended if focal neurological abnormalities  persist. 2. Mild left maxillary sinus disease. Electronically Signed   By: Virgina Norfolk M.D.   On: 09/01/2022 19:29    cyanocobalamin (VITAMIN B12) injection 1,000 mcg     Date Action Dose Route User   09/09/2022 1450 Given 1,000 mcg Intramuscular (Right Deltoid) Agnes Lawrence, CMA  No data to display          No results found for: "NITRICOXIDE"      Assessment & Plan:   Snoring She has snoring, morning headaches, AM dry mouth, nocturia. BMI 49. History of HTN and DM. Given this,  I am concerned she could have sleep disordered breathing with obstructive sleep apnea. She will need sleep study for further evaluation.    - discussed how weight can impact sleep and risk for sleep disordered breathing - discussed options to assist with weight loss: combination of diet modification, cardiovascular and strength training exercises   - had an extensive discussion regarding the adverse health consequences related to untreated sleep disordered breathing - specifically discussed the risks for hypertension, coronary artery disease, cardiac dysrhythmias, cerebrovascular disease, and diabetes - lifestyle modification discussed   - discussed how sleep disruption can increase risk of accidents, particularly when driving - safe driving practices were discussed  Patient Instructions  Given your symptoms, I am concerned that you may have sleep disordered breathing with sleep apnea. You will need a sleep study for further evaluation. Someone will contact you to schedule this.  We discussed how untreated sleep apnea puts an individual at risk for cardiac arrhthymias, pulm HTN, DM, stroke and increases their risk for daytime accidents. We also briefly reviewed treatment options including weight loss, side sleeping position, oral appliance, CPAP therapy or referral to ENT for possible surgical options  Use caution when driving and pull over if you become sleepy.  Follow up after  your sleep study with Joellen Jersey Gildo Crisco,NP to review results, or sooner, if needed    Obesity BMI 49. Reviewed correlation between obesity and OSA. Healthy weight loss encouraged.    I spent 35 minutes of dedicated to the care of this patient on the date of this encounter to include pre-visit review of records, face-to-face time with the patient discussing conditions above, post visit ordering of testing, clinical documentation with the electronic health record, making appropriate referrals as documented, and communicating necessary findings to members of the patients care team.  Clayton Bibles, NP 09/17/2022  Pt aware and understands NP's role.

## 2022-09-17 NOTE — Assessment & Plan Note (Signed)
She has snoring, morning headaches, AM dry mouth, nocturia. BMI 49. History of HTN and DM. Given this,  I am concerned she could have sleep disordered breathing with obstructive sleep apnea. She will need sleep study for further evaluation.    - discussed how weight can impact sleep and risk for sleep disordered breathing - discussed options to assist with weight loss: combination of diet modification, cardiovascular and strength training exercises   - had an extensive discussion regarding the adverse health consequences related to untreated sleep disordered breathing - specifically discussed the risks for hypertension, coronary artery disease, cardiac dysrhythmias, cerebrovascular disease, and diabetes - lifestyle modification discussed   - discussed how sleep disruption can increase risk of accidents, particularly when driving - safe driving practices were discussed  Patient Instructions  Given your symptoms, I am concerned that you may have sleep disordered breathing with sleep apnea. You will need a sleep study for further evaluation. Someone will contact you to schedule this.  We discussed how untreated sleep apnea puts an individual at risk for cardiac arrhthymias, pulm HTN, DM, stroke and increases their risk for daytime accidents. We also briefly reviewed treatment options including weight loss, side sleeping position, oral appliance, CPAP therapy or referral to ENT for possible surgical options  Use caution when driving and pull over if you become sleepy.  Follow up after your sleep study with Katie Loa Idler,NP to review results, or sooner, if needed

## 2022-09-17 NOTE — Assessment & Plan Note (Signed)
BMI 49. Reviewed correlation between obesity and OSA. Healthy weight loss encouraged.

## 2022-09-17 NOTE — Patient Instructions (Signed)
Given your symptoms, I am concerned that you may have sleep disordered breathing with sleep apnea. You will need a sleep study for further evaluation. Someone will contact you to schedule this.  We discussed how untreated sleep apnea puts an individual at risk for cardiac arrhthymias, pulm HTN, DM, stroke and increases their risk for daytime accidents. We also briefly reviewed treatment options including weight loss, side sleeping position, oral appliance, CPAP therapy or referral to ENT for possible surgical options  Use caution when driving and pull over if you become sleepy.  Follow up after your sleep study with Rachel Mariel Lukins,NP to review results, or sooner, if needed

## 2022-09-23 ENCOUNTER — Ambulatory Visit (INDEPENDENT_AMBULATORY_CARE_PROVIDER_SITE_OTHER): Payer: Commercial Managed Care - PPO | Admitting: *Deleted

## 2022-09-23 DIAGNOSIS — E538 Deficiency of other specified B group vitamins: Secondary | ICD-10-CM | POA: Diagnosis not present

## 2022-09-23 MED ORDER — CYANOCOBALAMIN 1000 MCG/ML IJ SOLN
1000.0000 ug | Freq: Once | INTRAMUSCULAR | Status: AC
  Administered 2022-09-23: 1000 ug via INTRAMUSCULAR

## 2022-09-23 NOTE — Progress Notes (Signed)
Per orders of Dr. Banks, injection of Cyanocobalamin 1000mcg given by Lorilei Horan A. °Patient tolerated injection well. ° °

## 2022-09-29 ENCOUNTER — Other Ambulatory Visit (HOSPITAL_COMMUNITY): Payer: Self-pay

## 2022-09-29 ENCOUNTER — Other Ambulatory Visit: Payer: Self-pay

## 2022-09-29 MED ORDER — AMLODIPINE BESYLATE 5 MG PO TABS
5.0000 mg | ORAL_TABLET | Freq: Every day | ORAL | 1 refills | Status: DC
  Filled 2022-09-29: qty 90, 90d supply, fill #0

## 2022-09-30 ENCOUNTER — Other Ambulatory Visit (HOSPITAL_COMMUNITY): Payer: Self-pay

## 2022-09-30 MED ORDER — IRBESARTAN 75 MG PO TABS
75.0000 mg | ORAL_TABLET | Freq: Every day | ORAL | 0 refills | Status: DC
  Filled 2022-09-30: qty 90, 90d supply, fill #0

## 2022-09-30 MED ORDER — PRAVASTATIN SODIUM 20 MG PO TABS
40.0000 mg | ORAL_TABLET | Freq: Every day | ORAL | 0 refills | Status: DC
  Filled 2022-09-30: qty 180, 90d supply, fill #0

## 2022-09-30 MED ORDER — AMLODIPINE BESYLATE 5 MG PO TABS
5.0000 mg | ORAL_TABLET | Freq: Every day | ORAL | 1 refills | Status: DC
  Filled 2022-09-30: qty 90, 90d supply, fill #0

## 2022-09-30 MED FILL — Metformin HCl Tab ER 24HR 500 MG: ORAL | 90 days supply | Qty: 360 | Fill #0 | Status: AC

## 2022-10-01 ENCOUNTER — Other Ambulatory Visit (HOSPITAL_COMMUNITY): Payer: Self-pay

## 2022-10-02 ENCOUNTER — Other Ambulatory Visit (HOSPITAL_COMMUNITY): Payer: Self-pay

## 2022-10-05 ENCOUNTER — Ambulatory Visit: Payer: Commercial Managed Care - PPO | Admitting: Family Medicine

## 2022-10-05 ENCOUNTER — Ambulatory Visit
Admission: RE | Admit: 2022-10-05 | Discharge: 2022-10-05 | Disposition: A | Discharge status: Home / Self Care | Payer: Commercial Managed Care - PPO | Source: Ambulatory Visit | Attending: Psychiatry | Admitting: Psychiatry

## 2022-10-05 ENCOUNTER — Encounter: Payer: Self-pay | Admitting: Family Medicine

## 2022-10-05 VITALS — BP 180/100 | HR 78 | Temp 99.1°F | Ht 67.0 in | Wt 311.6 lb

## 2022-10-05 DIAGNOSIS — E1169 Type 2 diabetes mellitus with other specified complication: Secondary | ICD-10-CM

## 2022-10-05 DIAGNOSIS — I10 Essential (primary) hypertension: Secondary | ICD-10-CM

## 2022-10-05 DIAGNOSIS — R0683 Snoring: Secondary | ICD-10-CM

## 2022-10-05 DIAGNOSIS — R29818 Other symptoms and signs involving the nervous system: Secondary | ICD-10-CM | POA: Diagnosis not present

## 2022-10-05 DIAGNOSIS — E538 Deficiency of other specified B group vitamins: Secondary | ICD-10-CM

## 2022-10-05 DIAGNOSIS — R519 Headache, unspecified: Secondary | ICD-10-CM | POA: Diagnosis not present

## 2022-10-05 MED ORDER — CYANOCOBALAMIN 1000 MCG/ML IJ SOLN
1000.0000 ug | Freq: Once | INTRAMUSCULAR | Status: AC
  Administered 2022-10-05: 1000 ug via INTRAMUSCULAR

## 2022-10-05 MED ORDER — GADOPICLENOL 0.5 MMOL/ML IV SOLN
10.0000 mL | Freq: Once | INTRAVENOUS | Status: AC | PRN
  Administered 2022-10-05: 10 mL via INTRAVENOUS

## 2022-10-05 NOTE — Progress Notes (Signed)
Established Patient Office Visit   Subjective  Patient ID: Rachel Dawson, female    DOB: Sep 22, 1980  Age: 42 y.o. MRN: [id number]  Chief Complaint  Patient presents with   Medical Management of Chronic Issues    Patient is a 42 year old female with pmh sig for HTN, DM2, HLD presents for follow-up on chronic conditions.  Patient monitoring BP at home.  Typically higher on days she does not work.  BP readings 150/100 this morning, 131/79, 140/80, 160/93, 145/76, 140/80, 137/77.  Taking irbesartan 75 mg 1-1/2 tabs daily given increased BP, Norvasc 5 mg daily, spironolactone 25 mg daily.  Seen by pulmonology to set up sleep study.  Continuing to make lifestyle changes.  Hemoglobin A1c 110/25/23 was 7.3%.    Past Medical History:  Diagnosis Date   Anemia    Fatty liver    History of blood transfusion    "when I had tonsils taken out & w/hyster"   Hypercholesterolemia    Hypertension    Menometrorrhagia    Microcytic anemia    Type II diabetes mellitus (HCC)    Vitamin D deficiency    Social History   Tobacco Use   Smoking status: Former    Packs/day: 0.12    Years: 4.00    Additional pack years: 0.00    Total pack years: 0.48    Types: Cigarettes    Quit date: 07/10/2014    Years since quitting: 8.3   Smokeless tobacco: Never  Vaping Use   Vaping Use: Never used  Substance Use Topics   Alcohol use: Yes    Comment: occ   Drug use: No   Family History  Adopted: Yes  Problem Relation Age of Onset   Hypertension Daughter    Allergies  Allergen Reactions   Latex Rash      ROS Negative unless stated above    Objective:     BP (!) 180/100 (BP Location: Left Arm, Patient Position: Sitting, Cuff Size: Large)   Pulse 78   Temp 99.1 F (37.3 C) (Oral)   Ht 5\' 7"  (1.702 m)   Wt (!) 311 lb 9.6 oz (141.3 kg)   LMP 01/05/2011 (Exact Date)   SpO2 98%   BMI 48.80 kg/m  BP Readings from Last 3 Encounters:  10/05/22 (!) 180/100  09/17/22 124/80  09/09/22 (!)  200/110   Wt Readings from Last 3 Encounters:  10/05/22 (!) 311 lb 9.6 oz (141.3 kg)  09/17/22 (!) 313 lb 3.2 oz (142.1 kg)  09/09/22 (!) 309 lb 8 oz (140.4 kg)      Physical Exam Constitutional:      General: She is not in acute distress.    Appearance: Normal appearance.  HENT:     Head: Normocephalic and atraumatic.     Nose: Nose normal.     Mouth/Throat:     Mouth: Mucous membranes are moist.  Cardiovascular:     Rate and Rhythm: Normal rate and regular rhythm.     Heart sounds: Normal heart sounds. No murmur heard.    No gallop.  Pulmonary:     Effort: Pulmonary effort is normal. No respiratory distress.     Breath sounds: Normal breath sounds. No wheezing, rhonchi or rales.  Skin:    General: Skin is warm and dry.  Neurological:     Mental Status: She is alert and oriented to person, place, and time.      No results found for any visits on 10/05/22.    Assessment &  Plan:  Essential  -BP liable -Uncontrolled in clinic. -Awaiting sleep study.  Concern for OSA may be contributing to HTN. -Will have patient hold Norvasc 5 mg daily. -Continue irbesartan 75 mg daily. -will start Spironolactone 50 mg next wk as starting farxiga this wk. -Continue to monitor BP at home -Continue lifestyle modifications -Given strict precautions  Snoring -Seen by pulmonology.  Awaiting sleep study.  Type 2 diabetes mellitus with other specified complication, without long-term current use of insulin (HCC) -Hemoglobin A1c 7.3% on 06/03/2022 -Will start Farxiga 5 mg daily this week. -Continue Tresiba 12 units twice daily and metformin XR 1000 mg twice daily -Continue ARB and statin -Continue lifestyle modifications -Continue follow-up with endocrinology -Pt schedule eye exam.  Last done 07/25/2021.  Foot exam done 12/10/2021.  Morbid obesity ([place] -Body mass index is 48.8 kg/m. -Congratulated on 2 pound weight loss since office visit on 09/17/2022. -Continue lifestyle  modifications -Consider weight management referral  Vitamin B12 deficiency -Vitamin B12 level 168 in 09/03/2022 -Given B12 injection in clinic.  Continue regular replacement and recheck vitamin B12 level in the next few months. -     Cyanocobalamin   Return in about 7 weeks (around 11/23/2022).   Billie Ruddy, MD

## 2022-10-05 NOTE — Patient Instructions (Signed)
Start Wilder Glade this week.  Next week start spironolactone 50 mg daily along with your other blood pressure medications.  Continue monitoring your blood pressure at home to see if you notice improvement.  Do not forget to follow-up with pulmonology in regards to ordering the home sleep study supplies.

## 2022-10-06 ENCOUNTER — Other Ambulatory Visit (HOSPITAL_COMMUNITY): Payer: Self-pay

## 2022-10-06 ENCOUNTER — Encounter: Payer: Self-pay | Admitting: Pharmacist

## 2022-10-06 ENCOUNTER — Other Ambulatory Visit: Payer: Self-pay | Admitting: Internal Medicine

## 2022-10-06 ENCOUNTER — Other Ambulatory Visit: Payer: Self-pay

## 2022-10-06 DIAGNOSIS — Z794 Long term (current) use of insulin: Secondary | ICD-10-CM

## 2022-10-06 DIAGNOSIS — E1165 Type 2 diabetes mellitus with hyperglycemia: Secondary | ICD-10-CM

## 2022-10-06 MED ORDER — TRESIBA FLEXTOUCH 200 UNIT/ML ~~LOC~~ SOPN
11.0000 [IU] | PEN_INJECTOR | Freq: Two times a day (BID) | SUBCUTANEOUS | 1 refills | Status: DC
  Filled 2022-10-06: qty 6, 50d supply, fill #0
  Filled 2022-11-21: qty 6, 50d supply, fill #1
  Filled 2023-02-17: qty 6, 50d supply, fill #2

## 2022-10-07 ENCOUNTER — Other Ambulatory Visit: Payer: Self-pay

## 2022-10-07 ENCOUNTER — Other Ambulatory Visit (HOSPITAL_COMMUNITY): Payer: Self-pay

## 2022-10-22 ENCOUNTER — Other Ambulatory Visit: Payer: Self-pay

## 2022-10-22 ENCOUNTER — Other Ambulatory Visit (HOSPITAL_COMMUNITY): Payer: Self-pay

## 2022-10-23 ENCOUNTER — Other Ambulatory Visit (HOSPITAL_COMMUNITY): Payer: Self-pay

## 2022-10-26 ENCOUNTER — Encounter: Payer: Self-pay | Admitting: Psychiatry

## 2022-10-27 ENCOUNTER — Other Ambulatory Visit (HOSPITAL_COMMUNITY): Payer: Self-pay

## 2022-10-27 MED ORDER — NURTEC 75 MG PO TBDP
75.0000 mg | ORAL_TABLET | ORAL | 6 refills | Status: AC | PRN
  Filled 2022-10-27 – 2023-01-01 (×2): qty 8, 30d supply, fill #0
  Filled 2023-04-05: qty 8, 30d supply, fill #1
  Filled 2023-06-08: qty 8, 30d supply, fill #2
  Filled 2023-09-14 – 2023-10-27 (×2): qty 8, 30d supply, fill #3

## 2022-10-27 NOTE — Telephone Encounter (Signed)
Rx for nurtec sent to her pharmacy, thanks

## 2022-11-07 MED ORDER — SPIRONOLACTONE 25 MG PO TABS: 50.0000 mg | ORAL_TABLET | Freq: Every day | ORAL | 3 refills | Status: DC

## 2022-11-07 NOTE — Addendum Note (Signed)
Addended by: Grier Mitts R on: 11/07/2022 11:19 PM   Modules accepted: Orders

## 2022-11-11 ENCOUNTER — Ambulatory Visit (INDEPENDENT_AMBULATORY_CARE_PROVIDER_SITE_OTHER): Payer: Commercial Managed Care - PPO | Admitting: Primary Care

## 2022-11-11 DIAGNOSIS — R0683 Snoring: Secondary | ICD-10-CM

## 2022-11-11 DIAGNOSIS — Z6841 Body Mass Index (BMI) 40.0 and over, adult: Secondary | ICD-10-CM

## 2022-11-16 ENCOUNTER — Other Ambulatory Visit (HOSPITAL_COMMUNITY): Payer: Self-pay

## 2022-11-20 NOTE — Progress Notes (Signed)
10/29/2022 HST with mild OSA, AHI 9.4. please schedule f/u to discuss treatment options.

## 2022-11-21 ENCOUNTER — Other Ambulatory Visit (HOSPITAL_COMMUNITY): Payer: Self-pay

## 2022-11-24 ENCOUNTER — Other Ambulatory Visit: Payer: Self-pay

## 2022-12-09 ENCOUNTER — Ambulatory Visit: Payer: Commercial Managed Care - PPO | Admitting: Nurse Practitioner

## 2022-12-10 ENCOUNTER — Ambulatory Visit: Payer: Commercial Managed Care - PPO | Admitting: Nurse Practitioner

## 2022-12-10 ENCOUNTER — Encounter: Payer: Self-pay | Admitting: Nurse Practitioner

## 2022-12-10 DIAGNOSIS — Z6841 Body Mass Index (BMI) 40.0 and over, adult: Secondary | ICD-10-CM | POA: Diagnosis not present

## 2022-12-10 DIAGNOSIS — G4733 Obstructive sleep apnea (adult) (pediatric): Secondary | ICD-10-CM | POA: Diagnosis not present

## 2022-12-10 NOTE — Assessment & Plan Note (Signed)
Mild OSA with AHI 9.4/h and SpO2 low 82%. We reviewed potential treatment options and risks of mild OSA. She would like to consider an oral appliance but is unsure if insurance will cover this. She will reach out to her insurance carrier for more information. If not covered, she would like to start CPAP therapy. Educated on proper use/care of device. Aware of safe driving practices. Healthy weight loss encouraged.   Patient Instructions  Call your insurance and let me know if they will cover an oral appliance for management of your mild sleep apnea. If not, we will move forward with CPAP therapy.  Dr. Myrtis Ser and Dr. Irene Limbo are typically the two orthodontists we refer to   We discussed how untreated sleep apnea puts an individual at risk for cardiac arrhthymias, pulm HTN, DM, stroke and increases their risk for daytime accidents; although, these are less likely with mild sleep apnea. We also briefly reviewed treatment options including weight loss, side sleeping position, oral appliance, CPAP therapy or referral to ENT for possible surgical options  Referral to bariatric surgery placed today   Follow up in 12 weeks to see how oral appliance or CPAP is going with Dr. Wynona Neat or Philis Nettle, or sooner, if needed

## 2022-12-10 NOTE — Assessment & Plan Note (Addendum)
BMI 47.9. She would like a referral to bariatric surgery for further information - order placed today. She will continue to work with her endocrinologist and PCP in the interim. Encouraged to remain active.

## 2022-12-10 NOTE — Patient Instructions (Addendum)
Call your insurance and let me know if they will cover an oral appliance for management of your mild sleep apnea. If not, we will move forward with CPAP therapy.  Dr. Myrtis Ser and Dr. Irene Limbo are typically the two orthodontists we refer to   We discussed how untreated sleep apnea puts an individual at risk for cardiac arrhthymias, pulm HTN, DM, stroke and increases their risk for daytime accidents; although, these are less likely with mild sleep apnea. We also briefly reviewed treatment options including weight loss, side sleeping position, oral appliance, CPAP therapy or referral to ENT for possible surgical options  Referral to bariatric surgery placed today   Follow up in 12 weeks to see how oral appliance or CPAP is going with Dr. Wynona Neat or Philis Nettle, or sooner, if needed

## 2022-12-10 NOTE — Progress Notes (Signed)
@Patient  ID: Rachel Dawson, female    DOB: 07/15/1981, 42 y.o.   MRN: [id number]  Chief Complaint  Patient presents with   Follow-up    Hst results     Referring provider: Deeann Saint, MD  HPI: 42 year old female, former smoker referred for sleep consult.  Past medical history significant for hypertension, DM 2, obesity, anxiety.  TEST/EVENTS:  10/29/2022 HST: AHI 9.4/h, SpO2 low 82%  09/17/2022: OV with Delia Sitar NP for sleep consult referred by Dr. Salomon Fick. She has had trouble trying to control her diabetes and is on insulin, farxiga and metformin. She has also had elevated BP readings and was started on antihypertensives. Her PCP was concerned that with this, her weight, and her snoring, she could have underlying sleep apnea so wanted her to have further workup. She tells me today that she sleeps well for the most part. She does wake up some mornings feeling tired but doesn't usually feel tired during the day. She has worked almost 7 days a week for a lot of her life so she's unsure if she's just too busy to notice that she is tired. She wakes up a few times a night to use the restroom. She has also been told by her son that he can hear her snoring from the other room some nights. She occasionally wakes with morning headaches and dry mouth. She denies any drowsy driving, sleep parasomnias/paralysis. No history of narcolepsy or symptoms of cataplexy.  She goes to bed between 830-9 pm. Falls asleep in 20-30 minutes. Wakes 2-3 times a night. Wake time varies depending on her work schedule but usually up around 7 am. She does not take any sleep aids. 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 Weight has fluctuated up and down 5-10 lb over the past two years. She has never had a previous sleep study. She has a history of HTN, DM. No history of stroke. She had a hysterectomy in 2015. She is a former smoker, quit in 2019. She rarely drinks alcohol. She lives at home with her children.  Works as a Academic librarian. No significant family history.  [place] 3  12/10/2022: Today - follow up Patient resents today for follow-up to discuss home sleep study results, which revealed mild obstructive sleep apnea with AHI 9.4/h.  Feels relatively unchanged compared to last time she was here.  Does not necessarily feel tired during the day.  Feels like she sleeps well at night.  She still gets up a few times a night to use the restroom.  Does snore at night.  Denies any drowsy driving or sleep parasomnia/paralysis.  Wants to discuss potential treatment options. She also tells me that she has been trying to work on weight loss measures for a long time.  She was on Victoza in 2016 and developed pancreatitis so her endocrinologist has been hesitant to use any GLP-1 drugs.  She is currently on Farxiga and insulin for management of her diabetes, which seems to be well-controlled currently.  Marcelline Deist has not done much for her and ways of weight loss.  She closely monitors her diet and feels like she eats a relatively healthy, well-balanced diet.  She goes to the gym and walks frequently.  Tries to be as active as possible.  She has considered bariatric surgery but is unsure if she wants to move forward with this or not.  She would like to talk to a bariatric surgeon to get some more information and make  her decision from there.  Allergies  Allergen Reactions   Latex Rash    Immunization History  Administered Date(s) Administered   Influenza,inj,Quad PF,6+ Mos 04/15/2017   Influenza-Unspecified 05/24/2021   PFIZER(Purple Top)SARS-COV-2 Vaccination 05/19/2020, 06/09/2020, 11/22/2020   Pneumococcal-Unspecified 06/10/2014   Tdap 04/15/2017, 07/01/2020   Unspecified SARS-COV-2 Vaccination 05/19/2020    Past Medical History:  Diagnosis Date   Anemia    Fatty liver    History of blood transfusion    "when I had tonsils taken out & w/hyster"   Hypercholesterolemia    Hypertension     Menometrorrhagia    Microcytic anemia    Type II diabetes mellitus (HCC)    Vitamin D deficiency     Tobacco History: Social History   Tobacco Use  Smoking Status Former   Packs/day: 0.12   Years: 4.00   Additional pack years: 0.00   Total pack years: 0.48   Types: Cigarettes   Quit date: 07/10/2014   Years since quitting: 8.4  Smokeless Tobacco Never   Counseling given: Not Answered   Outpatient Medications Prior to Visit  Medication Sig Dispense Refill   BD PEN NEEDLE NANO 2ND GEN 32G X 4 MM MISC USE ONE NEEDLE TWICE A DAY 200 each 3   Blood Pressure Monitoring (BLOOD PRESSURE MONITOR/L CUFF) MISC Check your blood pressure at the same time each day. 1 each 0   Continuous Blood Gluc Receiver (FREESTYLE LIBRE 2 READER) DEVI 1 each by Does not apply route daily. 1 each 0   Continuous Blood Gluc Sensor (FREESTYLE LIBRE 2 SENSOR) MISC Use as directed & change every 14 days to check blood sugar. 6 each 3   dapagliflozin propanediol (FARXIGA) 5 MG TABS tablet Take 1 tablet (5 mg total) by mouth daily before breakfast. 90 tablet 3   fluticasone (FLONASE) 50 MCG/ACT nasal spray Place 1 spray into both nostrils daily. 16 g 0   hydrOXYzine (ATARAX) 50 MG tablet Take 1 tablet (50 mg total) by mouth 3 (three) times daily as needed for anxiety. 60 tablet 3   insulin degludec (TRESIBA FLEXTOUCH) 200 UNIT/ML FlexTouch Pen Inject 12 Units into the skin in the morning and at bedtime. 9 mL 1   irbesartan (AVAPRO) 75 MG tablet Take 1 tablet (75 mg total) by mouth daily. 90 tablet 1   irbesartan (AVAPRO) 75 MG tablet Take 1 tablet (75 mg total) by mouth daily. 90 tablet 0   LORazepam (ATIVAN) 0.5 MG tablet Take 1-2 pills 30 minutes prior to MRI 2 tablet 0   metFORMIN (GLUCOPHAGE-XR) 500 MG 24 hr tablet TAKE 2 TABLETS BY MOUTH 2 TIMES DAILY. GIVE W/FOOD. 360 tablet 1   pravastatin (PRAVACHOL) 20 MG tablet Take 2 tablets (40 mg total) by mouth daily. 180 tablet 0   pravastatin (PRAVACHOL) 40 MG  tablet Take 1 tablet (40 mg total) by mouth daily. 90 tablet 3   Rimegepant Sulfate (NURTEC) 75 MG TBDP Take 1 tablet (75 mg total) by mouth as needed. 8 tablet 6   rizatriptan (MAXALT) 5 MG tablet Take 1 tablet (5 mg total) by mouth as needed for migraine. May repeat in 2 hours if needed 10 tablet 3   spironolactone (ALDACTONE) 25 MG tablet Take 2 tablets (50 mg total) by mouth daily. 90 tablet 3   No facility-administered medications prior to visit.     Review of Systems:   Constitutional: No weight loss or gain, night sweats, fevers, chills, or lassitude. +occasional AM tiredness HEENT: No headaches,  difficulty swallowing, tooth/dental problems, or sore throat. No sneezing, itching, ear ache, nasal congestion, or post nasal drip CV:  No chest pain, orthopnea, PND, swelling in lower extremities, anasarca, dizziness, palpitations, syncope Resp: +snoring. No shortness of breath with exertion or at rest. No excess mucus or change in color of mucus. No productive or non-productive. No hemoptysis. No wheezing.  No chest wall deformity GI:  No heartburn, indigestion, abdominal pain, nausea, vomiting, diarrhea, change in bowel habits, loss of appetite, bloody stools.  GU: +nocturia. No dysuria, change in color of urine, urgency or frequency.  No flank pain, no hematuria  Skin: No rash, lesions, ulcerations MSK:  No joint pain or swelling.   Neuro: No dizziness or lightheadedness.  Psych: No depression. +anxiety (stable). Mood stable.     Physical Exam:  BP 138/86   Pulse 82   Temp 98.1 F (36.7 C) (Oral)   Ht 5\' 7"  (1.702 m)   Wt (!) 306 lb (138.8 kg)   LMP 01/05/2011 (Exact Date)   SpO2 98%   BMI 47.93 kg/m   GEN: Pleasant, interactive, well-appearing; morbidly obese; in no acute distress. HEENT:  Normocephalic and atraumatic. PERRLA. Sclera white. Nasal turbinates pink, moist and patent bilaterally. No rhinorrhea present. Oropharynx pink and moist, without exudate or edema. No  lesions, ulcerations, or postnasal drip. Mallampati III NECK:  Supple w/ fair ROM. No JVD present. Normal carotid impulses w/o bruits. Thyroid symmetrical with no goiter or nodules palpated. No lymphadenopathy.   CV: RRR, no m/r/g, no peripheral edema. Pulses intact, +2 bilaterally. No cyanosis, pallor or clubbing. PULMONARY:  Unlabored, regular breathing. Clear bilaterally A&P w/o wheezes/rales/rhonchi. No accessory muscle use.  GI: BS present and normoactive. Soft, non-tender to palpation. No organomegaly or masses detected.  MSK: No erythema, warmth or tenderness. Cap refil <2 sec all extrem. No deformities or joint swelling noted.  Neuro: A/Ox3. No focal deficits noted.   Skin: Warm, no lesions or rashe Psych: Normal affect and behavior. Judgement and thought content appropriate.     Lab Results:  CBC    Component Value Date/Time   WBC 7.9 09/01/2022 1825   RBC 4.70 09/01/2022 1825   HGB 12.3 09/01/2022 1825   HGB 12.6 11/02/2019 1645   HGB 9.8 (L) 03/03/2011 1309   HCT 38.7 09/01/2022 1825   HCT 39.3 11/02/2019 1645   HCT 31.8 (L) 03/03/2011 1309   PLT 342 09/01/2022 1825   PLT 309 11/02/2019 1645   MCV 82.3 09/01/2022 1825   MCV 85 11/02/2019 1645   MCV 77.8 (L) 03/03/2011 1309   MCH 26.2 09/01/2022 1825   MCHC 31.8 09/01/2022 1825   RDW 13.7 09/01/2022 1825   RDW 13.5 11/02/2019 1645   RDW 16.7 (H) 03/03/2011 1309   LYMPHSABS 1.6 12/10/2021 1115   LYMPHSABS 1.8 11/02/2019 1645   LYMPHSABS 1.7 03/03/2011 1309   MONOABS 0.3 12/10/2021 1115   MONOABS 0.3 03/03/2011 1309   EOSABS 0.1 12/10/2021 1115   EOSABS 0.2 11/02/2019 1645   BASOSABS 0.0 12/10/2021 1115   BASOSABS 0.0 11/02/2019 1645   BASOSABS 0.0 03/03/2011 1309    BMET    Component Value Date/Time   NA 136 09/01/2022 1825   NA 138 11/02/2019 1645   K 3.7 09/01/2022 1825   CL 104 09/01/2022 1825   CO2 24 09/01/2022 1825   GLUCOSE 106 (H) 09/01/2022 1825   BUN 14 09/01/2022 1825   BUN 21 11/02/2019  1645   CREATININE 0.89 09/01/2022 1825   CALCIUM  9.2 09/01/2022 1825   GFRNONAA >60 09/01/2022 1825   GFRAA 98 11/02/2019 1645    BNP No results found for: "BNP"   Imaging:  No results found.         No data to display          No results found for: "NITRICOXIDE"      Assessment & Plan:   Mild obstructive sleep apnea Mild OSA with AHI 9.4/h and SpO2 low 82%. We reviewed potential treatment options and risks of mild OSA. She would like to consider an oral appliance but is unsure if insurance will cover this. She will reach out to her insurance carrier for more information. If not covered, she would like to start CPAP therapy. Educated on proper use/care of device. Aware of safe driving practices. Healthy weight loss encouraged.   Patient Instructions  Call your insurance and let me know if they will cover an oral appliance for management of your mild sleep apnea. If not, we will move forward with CPAP therapy.  Dr. Myrtis Ser and Dr. Irene Limbo are typically the two orthodontists we refer to   We discussed how untreated sleep apnea puts an individual at risk for cardiac arrhthymias, pulm HTN, DM, stroke and increases their risk for daytime accidents; although, these are less likely with mild sleep apnea. We also briefly reviewed treatment options including weight loss, side sleeping position, oral appliance, CPAP therapy or referral to ENT for possible surgical options  Referral to bariatric surgery placed today   Follow up in 12 weeks to see how oral appliance or CPAP is going with Dr. Wynona Neat or Philis Nettle, or sooner, if needed    Morbid obesity with BMI of 45.0-49.9, adult (HCC) BMI 47.9. She would like a referral to bariatric surgery for further information - order placed today. She will continue to work with her endocrinologist and PCP in the interim. Encouraged to remain active.     I spent 35 minutes of dedicated to the care of this patient on the date of  this encounter to include pre-visit review of records, face-to-face time with the patient discussing conditions above, post visit ordering of testing, clinical documentation with the electronic health record, making appropriate referrals as documented, and communicating necessary findings to members of the patients care team.  Rachel Chapel, NP 12/10/2022  Pt aware and understands NP's role.

## 2022-12-23 ENCOUNTER — Telehealth: Payer: Self-pay | Admitting: Nurse Practitioner

## 2022-12-23 DIAGNOSIS — G4733 Obstructive sleep apnea (adult) (pediatric): Secondary | ICD-10-CM

## 2022-12-23 NOTE — Telephone Encounter (Signed)
12/23/2022: MyChart message sent to patient to determine if she had decided on an option for treatment for mild OSA.

## 2022-12-24 NOTE — Telephone Encounter (Signed)
Cobb, Ruby Cola, NP  You5 minutes ago (4:13 PM)    Please place order to DME for new start auto CPAP 5-15 cmH2O, mask of choice and heated humidity. Needs follow up in 12 weeks. Thanks!    Order placed. Sent message to pt to call office to schedule an appt 31-90 days after receiving equipment. Nothing further needed.

## 2022-12-24 NOTE — Telephone Encounter (Signed)
Pt sent message back stating that she thinks she would prefer cpap. Routing to [place] for review.

## 2023-01-01 ENCOUNTER — Other Ambulatory Visit (HOSPITAL_COMMUNITY): Payer: Self-pay

## 2023-01-01 ENCOUNTER — Other Ambulatory Visit: Payer: Self-pay

## 2023-01-01 ENCOUNTER — Other Ambulatory Visit (HOSPITAL_BASED_OUTPATIENT_CLINIC_OR_DEPARTMENT_OTHER): Payer: Self-pay

## 2023-01-01 ENCOUNTER — Telehealth: Payer: Self-pay

## 2023-01-01 ENCOUNTER — Other Ambulatory Visit: Payer: Self-pay | Admitting: Family Medicine

## 2023-01-01 DIAGNOSIS — I10 Essential (primary) hypertension: Secondary | ICD-10-CM

## 2023-01-01 NOTE — Telephone Encounter (Signed)
Patient Advocate Encounter   Received notification from [employer] that prior authorization is required for Nurtec 75MG  dispersible tablets   Submitted: 01-01-2023 Key [id number]  Status is pending

## 2023-01-06 DIAGNOSIS — H5213 Myopia, bilateral: Secondary | ICD-10-CM | POA: Diagnosis not present

## 2023-01-06 DIAGNOSIS — E119 Type 2 diabetes mellitus without complications: Secondary | ICD-10-CM | POA: Diagnosis not present

## 2023-01-06 DIAGNOSIS — H52223 Regular astigmatism, bilateral: Secondary | ICD-10-CM | POA: Diagnosis not present

## 2023-01-06 DIAGNOSIS — H53143 Visual discomfort, bilateral: Secondary | ICD-10-CM | POA: Diagnosis not present

## 2023-01-06 DIAGNOSIS — H524 Presbyopia: Secondary | ICD-10-CM | POA: Diagnosis not present

## 2023-01-06 LAB — HM DIABETES EYE EXAM

## 2023-01-07 ENCOUNTER — Other Ambulatory Visit (HOSPITAL_COMMUNITY): Payer: Self-pay

## 2023-01-07 NOTE — Telephone Encounter (Signed)
Pharmacy Patient Advocate Encounter  Prior Authorization for Nurtec 75MG  dispersible tablets has been approved by [employer] (ins).    PA # PA Case ID #: [id number] Effective dates: 01/01/2023 through 06/30/2023  Copay is $0 per [facility] test claim.

## 2023-01-18 ENCOUNTER — Other Ambulatory Visit (HOSPITAL_COMMUNITY): Payer: Self-pay

## 2023-01-19 ENCOUNTER — Other Ambulatory Visit (HOSPITAL_COMMUNITY): Payer: Self-pay

## 2023-01-22 DIAGNOSIS — G4733 Obstructive sleep apnea (adult) (pediatric): Secondary | ICD-10-CM | POA: Diagnosis not present

## 2023-01-25 DIAGNOSIS — G4733 Obstructive sleep apnea (adult) (pediatric): Secondary | ICD-10-CM | POA: Diagnosis not present

## 2023-02-18 ENCOUNTER — Other Ambulatory Visit (HOSPITAL_COMMUNITY): Payer: Self-pay

## 2023-02-18 ENCOUNTER — Other Ambulatory Visit: Payer: Self-pay

## 2023-02-21 DIAGNOSIS — G4733 Obstructive sleep apnea (adult) (pediatric): Secondary | ICD-10-CM | POA: Diagnosis not present

## 2023-02-26 ENCOUNTER — Other Ambulatory Visit: Payer: Self-pay | Admitting: Internal Medicine

## 2023-02-26 ENCOUNTER — Other Ambulatory Visit (HOSPITAL_COMMUNITY): Payer: Self-pay

## 2023-02-26 ENCOUNTER — Other Ambulatory Visit (HOSPITAL_BASED_OUTPATIENT_CLINIC_OR_DEPARTMENT_OTHER): Payer: Self-pay

## 2023-02-26 MED ORDER — METFORMIN HCL ER 500 MG PO TB24
1000.0000 mg | ORAL_TABLET | Freq: Two times a day (BID) | ORAL | 0 refills | Status: DC
  Filled 2023-02-26: qty 360, 90d supply, fill #0

## 2023-03-15 ENCOUNTER — Encounter: Payer: Commercial Managed Care - PPO | Admitting: Family Medicine

## 2023-03-17 ENCOUNTER — Encounter: Payer: Commercial Managed Care - PPO | Admitting: Family Medicine

## 2023-03-17 DIAGNOSIS — Z90711 Acquired absence of uterus with remaining cervical stump: Secondary | ICD-10-CM | POA: Diagnosis not present

## 2023-03-17 DIAGNOSIS — Z6841 Body Mass Index (BMI) 40.0 and over, adult: Secondary | ICD-10-CM | POA: Diagnosis not present

## 2023-03-17 DIAGNOSIS — Z01411 Encounter for gynecological examination (general) (routine) with abnormal findings: Secondary | ICD-10-CM | POA: Diagnosis not present

## 2023-03-17 DIAGNOSIS — Z01419 Encounter for gynecological examination (general) (routine) without abnormal findings: Secondary | ICD-10-CM | POA: Diagnosis not present

## 2023-03-17 LAB — HM PAP SMEAR

## 2023-03-18 ENCOUNTER — Encounter: Payer: Self-pay | Admitting: Family Medicine

## 2023-03-18 ENCOUNTER — Ambulatory Visit (INDEPENDENT_AMBULATORY_CARE_PROVIDER_SITE_OTHER): Payer: Commercial Managed Care - PPO | Admitting: Family Medicine

## 2023-03-18 ENCOUNTER — Other Ambulatory Visit (HOSPITAL_BASED_OUTPATIENT_CLINIC_OR_DEPARTMENT_OTHER): Payer: Self-pay

## 2023-03-18 VITALS — BP 160/108 | HR 73 | Temp 98.8°F | Ht 65.5 in | Wt 307.8 lb

## 2023-03-18 DIAGNOSIS — G4733 Obstructive sleep apnea (adult) (pediatric): Secondary | ICD-10-CM | POA: Diagnosis not present

## 2023-03-18 DIAGNOSIS — I10 Essential (primary) hypertension: Secondary | ICD-10-CM | POA: Diagnosis not present

## 2023-03-18 DIAGNOSIS — Z794 Long term (current) use of insulin: Secondary | ICD-10-CM | POA: Diagnosis not present

## 2023-03-18 DIAGNOSIS — Z0001 Encounter for general adult medical examination with abnormal findings: Secondary | ICD-10-CM

## 2023-03-18 DIAGNOSIS — Z0000 Encounter for general adult medical examination without abnormal findings: Secondary | ICD-10-CM | POA: Diagnosis not present

## 2023-03-18 DIAGNOSIS — E7841 Elevated Lipoprotein(a): Secondary | ICD-10-CM

## 2023-03-18 DIAGNOSIS — E1169 Type 2 diabetes mellitus with other specified complication: Secondary | ICD-10-CM

## 2023-03-18 MED ORDER — SPIRONOLACTONE 50 MG PO TABS
50.0000 mg | ORAL_TABLET | Freq: Every day | ORAL | 3 refills | Status: DC
  Filled 2023-03-18: qty 90, 90d supply, fill #0
  Filled 2023-06-08: qty 90, 90d supply, fill #1
  Filled 2023-06-20: qty 90, 90d supply, fill #2

## 2023-03-18 MED ORDER — IRBESARTAN 75 MG PO TABS
75.0000 mg | ORAL_TABLET | Freq: Every day | ORAL | 3 refills | Status: DC
  Filled 2023-03-18 – 2023-04-15 (×2): qty 90, 90d supply, fill #0
  Filled 2023-06-08 – 2023-07-29 (×2): qty 90, 90d supply, fill #1
  Filled 2023-10-27: qty 90, 90d supply, fill #2

## 2023-03-18 NOTE — Progress Notes (Signed)
Established Patient Office Visit   Subjective  Patient ID: Rachel Dawson, female    DOB: [birth year]  Age: 42 y.o. MRN: [id number]  Chief Complaint  Patient presents with   Annual Exam    Pt is a 42 yo female seen for CPE and f/u on chronic conditions.  Pt states she never started farxiga due to cost.  Taking metformin and insulin.  Bs at home 93, 125, 93 per memory.  Pt was unable to get refill on spironolactone from pharmacy.  Taking irbesartan 75 mg.  Pt still having difficulty losing weight.  Does not want surgery.  Exercising a few times per wk.  Making changes to diet, cooks at home.   Developed pancreatitis in the past while on Victoza.  Followed by Endo.  Seen by Pulm for mild OSA.  Feels rested when wakes up.    Past Medical History:  Diagnosis Date   Anemia    Fatty liver    History of blood transfusion    "when I had tonsils taken out & w/hyster"   Hypercholesterolemia    Hypertension    Menometrorrhagia    Microcytic anemia    Type II diabetes mellitus (HCC)    Vitamin D deficiency    Past Surgical History:  Procedure Laterality Date   ABDOMINAL HYSTERECTOMY  ~ 2012   CESAREAN SECTION  1999; 2004; 2006   HIP PINNING Bilateral ~ 1991   "balls had dropped out of their sockets"   TONSILLECTOMY AND ADENOIDECTOMY     TUBAL LIGATION  2006   Social History   Tobacco Use   Smoking status: Former    Current packs/day: 0.00    Average packs/day: 0.1 packs/day for 4.0 years (0.5 ttl pk-yrs)    Types: Cigarettes    Start date: 07/10/2010    Quit date: 07/10/2014    Years since quitting: 8.7   Smokeless tobacco: Never  Vaping Use   Vaping status: Never Used  Substance Use Topics   Alcohol use: Yes    Comment: occ   Drug use: No   Family History  Adopted: Yes  Problem Relation Age of Onset   Hypertension Daughter    Allergies  Allergen Reactions   Latex Rash      ROS Negative unless stated above    Objective:     BP (!) 160/108 (BP Location: Left  Arm, Patient Position: Sitting, Cuff Size: Large)   Pulse 73   Temp 98.8 F (37.1 C) (Oral)   Ht 5' 5.5" (1.664 m)   Wt (!) 307 lb 12.8 oz (139.6 kg)   LMP 01/05/2011 (Exact Date)   SpO2 98%   BMI 50.44 kg/m  BP Readings from Last 3 Encounters:  03/18/23 (!) 160/108  12/10/22 138/86  10/05/22 (!) 180/100   Wt Readings from Last 3 Encounters:  03/18/23 (!) 307 lb 12.8 oz (139.6 kg)  12/10/22 (!) 306 lb (138.8 kg)  10/05/22 (!) 311 lb 9.6 oz (141.3 kg)      Physical Exam Constitutional:      Appearance: Normal appearance.  HENT:     Head: Normocephalic and atraumatic.     Right Ear: Tympanic membrane, ear canal and external ear normal.     Left Ear: Tympanic membrane, ear canal and external ear normal.     Nose: Nose normal.     Mouth/Throat:     Mouth: Mucous membranes are moist.     Pharynx: No oropharyngeal exudate or posterior oropharyngeal erythema.  Eyes:  General: No scleral icterus.    Extraocular Movements: Extraocular movements intact.     Conjunctiva/sclera: Conjunctivae normal.     Pupils: Pupils are equal, round, and reactive to light.  Neck:     Thyroid: No thyromegaly.  Cardiovascular:     Rate and Rhythm: Normal rate and regular rhythm.     Pulses: Normal pulses.     Heart sounds: Normal heart sounds. No murmur heard.    No friction rub.  Pulmonary:     Effort: Pulmonary effort is normal.     Breath sounds: Normal breath sounds. No wheezing, rhonchi or rales.  Abdominal:     General: Bowel sounds are normal.     Palpations: Abdomen is soft.     Tenderness: There is no abdominal tenderness.  Musculoskeletal:        General: No deformity. Normal range of motion.  Lymphadenopathy:     Cervical: No cervical adenopathy.  Skin:    General: Skin is warm and dry.     Findings: No lesion.  Neurological:     General: No focal deficit present.     Mental Status: She is alert and oriented to person, place, and time.  Psychiatric:        Mood and  Affect: Mood normal.        Thought Content: Thought content normal.      No results found for any visits on 03/18/23.    Assessment & Plan:  Encounter for routine adult health examination with abnormal findings -age appropriate health screenings reviewed. -will obtain labs when fasting via [facility] -immunizations reviewed -pap not indicated 2/2 h/o hysterectomy. -next CPE in 1 yr  Essential hypertension -     Comprehensive metabolic panel; Future -     CBC with Differential/Platelet; Future -     Irbesartan; Take 1 tablet (75 mg total) by mouth daily.  Dispense: 90 tablet; Refill: 3 -     Spironolactone; Take 1 tablet (50 mg total) by mouth daily.  Dispense: 90 tablet; Refill: 3  Morbid obesity (HCC) -Body mass index is 50.44 kg/m. -Patient to check with insurance companies regarding wt loss medications that are covered -Avoiding GLP-1s due to h/o pancreatitis. -     CBC with Differential/Platelet; Future -     TSH; Future -     T4, free; Future -     Hemoglobin A1c; Future -     Lipid panel; Future -     VITAMIN D 25 Hydroxy (Vit-D Deficiency, Fractures); Future -     Vitamin B12; Future  Type 2 diabetes mellitus with other specified complication, with long-term current use of insulin (HCC) -Continue follow-up with endocrinology -Continue metformin XR  1000 mg twice daily, Tresiba 200u/mL inject 12 u BID -continue ARB and statin -foot exam due. -     Hemoglobin A1c; Future -     Microalbumin / creatinine urine ratio  Elevated lipoprotein(a) -Lifestyle modifications -continue pravastatin 40 mg daily. -     Comprehensive metabolic panel; Future -     Lipid panel; Future  OSA (obstructive sleep apnea) -CPAP -continue f/u with Pulm -continue wt loss efforts.   Return in about 2 months (around 05/18/2023).   Deeann Saint, MD

## 2023-03-18 NOTE — Patient Instructions (Signed)
You can ask your insurance company about Qsymia (phentermine and Topamax), Contrave (bupropion and naltrexone), Lorcaaserin (Belviq or Belviq XR), orlistat (Alli), or bupropion (Wellbutrin).  These are all weight loss medications.

## 2023-03-22 ENCOUNTER — Encounter: Payer: Self-pay | Admitting: Family Medicine

## 2023-03-22 ENCOUNTER — Other Ambulatory Visit: Payer: Self-pay

## 2023-03-24 DIAGNOSIS — G4733 Obstructive sleep apnea (adult) (pediatric): Secondary | ICD-10-CM | POA: Diagnosis not present

## 2023-03-25 ENCOUNTER — Ambulatory Visit: Payer: Commercial Managed Care - PPO | Admitting: Nurse Practitioner

## 2023-04-05 ENCOUNTER — Other Ambulatory Visit: Payer: Self-pay | Admitting: Internal Medicine

## 2023-04-05 ENCOUNTER — Other Ambulatory Visit: Payer: Self-pay

## 2023-04-05 DIAGNOSIS — Z794 Long term (current) use of insulin: Secondary | ICD-10-CM

## 2023-04-06 ENCOUNTER — Other Ambulatory Visit: Payer: Self-pay | Admitting: Internal Medicine

## 2023-04-06 ENCOUNTER — Ambulatory Visit: Payer: Commercial Managed Care - PPO | Admitting: Psychiatry

## 2023-04-06 DIAGNOSIS — Z794 Long term (current) use of insulin: Secondary | ICD-10-CM

## 2023-04-07 ENCOUNTER — Encounter: Payer: Self-pay | Admitting: Internal Medicine

## 2023-04-07 ENCOUNTER — Other Ambulatory Visit: Payer: Self-pay

## 2023-04-07 ENCOUNTER — Other Ambulatory Visit (HOSPITAL_COMMUNITY): Payer: Self-pay

## 2023-04-07 DIAGNOSIS — Z794 Long term (current) use of insulin: Secondary | ICD-10-CM

## 2023-04-07 MED ORDER — TRESIBA FLEXTOUCH 200 UNIT/ML ~~LOC~~ SOPN
11.0000 [IU] | PEN_INJECTOR | Freq: Two times a day (BID) | SUBCUTANEOUS | 0 refills | Status: DC
  Filled 2023-04-07: qty 9, 75d supply, fill #0

## 2023-04-08 ENCOUNTER — Other Ambulatory Visit (HOSPITAL_COMMUNITY): Payer: Self-pay

## 2023-04-13 ENCOUNTER — Other Ambulatory Visit (HOSPITAL_COMMUNITY): Payer: Self-pay

## 2023-04-15 ENCOUNTER — Other Ambulatory Visit: Payer: Self-pay

## 2023-04-22 ENCOUNTER — Ambulatory Visit: Payer: Commercial Managed Care - PPO | Admitting: Psychiatry

## 2023-04-24 DIAGNOSIS — G4733 Obstructive sleep apnea (adult) (pediatric): Secondary | ICD-10-CM | POA: Diagnosis not present

## 2023-05-13 DIAGNOSIS — G4733 Obstructive sleep apnea (adult) (pediatric): Secondary | ICD-10-CM | POA: Diagnosis not present

## 2023-05-24 DIAGNOSIS — G4733 Obstructive sleep apnea (adult) (pediatric): Secondary | ICD-10-CM | POA: Diagnosis not present

## 2023-05-27 ENCOUNTER — Ambulatory Visit: Payer: Commercial Managed Care - PPO | Admitting: Internal Medicine

## 2023-05-27 ENCOUNTER — Encounter: Payer: Self-pay | Admitting: Internal Medicine

## 2023-05-27 ENCOUNTER — Other Ambulatory Visit (HOSPITAL_COMMUNITY): Payer: Self-pay

## 2023-05-27 VITALS — BP 146/92 | HR 66 | Ht 65.5 in | Wt 301.2 lb

## 2023-05-27 DIAGNOSIS — Z794 Long term (current) use of insulin: Secondary | ICD-10-CM

## 2023-05-27 DIAGNOSIS — E1165 Type 2 diabetes mellitus with hyperglycemia: Secondary | ICD-10-CM

## 2023-05-27 DIAGNOSIS — E66813 Obesity, class 3: Secondary | ICD-10-CM

## 2023-05-27 DIAGNOSIS — Z6841 Body Mass Index (BMI) 40.0 and over, adult: Secondary | ICD-10-CM

## 2023-05-27 DIAGNOSIS — E785 Hyperlipidemia, unspecified: Secondary | ICD-10-CM

## 2023-05-27 MED ORDER — GLUCOSE BLOOD VI STRP
1.0000 | ORAL_STRIP | Freq: Every day | 3 refills | Status: AC
  Filled 2023-05-27 – 2023-10-18 (×2): qty 100, 90d supply, fill #0

## 2023-05-27 NOTE — Progress Notes (Signed)
Patient ID: Rachel Dawson, female   DOB: 02/22/1981, 42 y.o.   MRN: [id number]   HPI: [name] is a 42 y.o.-year-old female, presenting for follow-up for DM2, dx in 2014, insulin-dependent, uncontrolled, without long term complications (but with hyperglycemia, yeast infections).  Last visit 1 year ago.  Interim history: No increased urination, blurry vision, nausea, chest pain.  She has headaches. She was in the emergency room with hypertensive urgency 08/2022.  Last hemoglobin A1c was: Lab Results  Component Value Date   HGBA1C 7.3 (A) 06/03/2022   HGBA1C 7.4 (H) 12/10/2021   HGBA1C 6.8 (H) 12/09/2020  04/30/2020: HbA1c calculated from fructosamine is 6.6% 04/14/2020: HbA1c 6.7% 09/23/2018: HbA1c calculated from fructosamine is 8.2% 06/13/2018: HbA1c from fructosamine: 7.44%  Pt is on a regimen of: - Metformin ER 1000 mg 2x a day with meals - Farxiga 5 mg before breakfast-added 05/2022 - was off, restarted this week -  >> Tresiba 28 units at bedtime >> 14 >> 11 units 2x a day >> 22 units daily She was on Victoza >> pancreatitis. She had nausea and diarrhea from regular metformin. She was on glimepiride 4 mg before dinner but ran out 08/2018 her last visit and we did not restart.   Pt checks her sugars >4x a day with her CGM - :  Prev.: - am: 109-115 >> 130s >> 89, 115-120 >> 118-156 - 2h after b'fast: n/c >> 90-143, 164 >> 125-130 >> n/c - before lunch: n/c >> her lunch >> 98-108 - 2h after lunch: n/c >> 100-139 >> n/c >> 137-163, 245 - before dinner: 110 >> 125-146 >> n/c >> 73-90s - 2h after dinner: 114-157 >> 138-140 >> n/c >> 110-120 - bedtime: n/c - nighttime: n/c Lowest sugar was 125 >> 89 >> 70s >> 69; it is unclear at which level she has hypoglycemia awareness. Highest sugar was 146 >> 200s (Covid) >> 245 >> 200.  Glucometer:  One Touch Ultra  She is seen in the [facility].  She eats a low-carb diet.  I advised her to stop juice at last visit.   She is now only drinking occasional diet sodas.    -No CKD, last BUN/creatinine:  Lab Results  Component Value Date   BUN 14 09/01/2022   BUN 15 12/10/2021   CREATININE 0.89 09/01/2022   CREATININE 0.87 12/10/2021   Lab Results  Component Value Date   MICRALBCREAT 2.0 03/18/2023   MICRALBCREAT 1.3 09/24/2021   MICRALBCREAT 1.2 12/09/2020   -+ HL; last set of lipids: Lab Results  Component Value Date   CHOL 190 12/10/2021   HDL 51.30 12/10/2021   LDLCALC 123 (H) 12/10/2021   LDLDIRECT 156.0 06/13/2018   TRIG 79.0 12/10/2021   CHOLHDL 4 12/10/2021  On pravastatin 40 mg daily, dose increased after the above results returned.  - last eye exam was 01/06/2023: No DR  - no numbness and tingling in her feet.  Latest foot exam was on 12/2021.  Pt has FH of DM in mother (?)  -She is adopted.  ROS: + see HPI  I reviewed pt's medications, allergies, PMH, social hx, family hx, and changes were documented in the history of present illness. Otherwise, unchanged from my initial visit note.  Past Medical History:  Diagnosis Date   Anemia    Fatty liver    History of blood transfusion    "when I had tonsils taken out & w/hyster"   Hypercholesterolemia    Hypertension    Menometrorrhagia  Microcytic anemia    Type II diabetes mellitus (HCC)    Vitamin D deficiency    Past Surgical History:  Procedure Laterality Date   ABDOMINAL HYSTERECTOMY  ~ 2012   CESAREAN SECTION  1999; 2004; 2006   HIP PINNING Bilateral ~ 1991   "balls had dropped out of their sockets"   TONSILLECTOMY AND ADENOIDECTOMY     TUBAL LIGATION  2006   Social History   Social History   Marital status: Single    Spouse name: N/A   Number of children: 2    Occupational History   Lab tech   Social History Main Topics   Smoking status: Former Smoker    Packs/day: 0.12 - 2 cigs a day    Years: 4.00    Types: Cigarettes    Quit date: 07/10/2014   Smokeless tobacco: Never Used   Alcohol use Yes      Comment: 11/22/2014 "might drink a little a couple times/month, if that"   Drug use: No   Sexual activity: Not Currently    Birth control/ protection: Surgical   Current Outpatient Medications on File Prior to Visit  Medication Sig Dispense Refill   BD PEN NEEDLE NANO 2ND GEN 32G X 4 MM MISC USE ONE NEEDLE TWICE A DAY 200 each 3   Blood Pressure Monitoring (BLOOD PRESSURE MONITOR/L CUFF) MISC Check your blood pressure at the same time each day. 1 each 0   Continuous Blood Gluc Receiver (FREESTYLE LIBRE 2 READER) DEVI 1 each by Does not apply route daily. 1 each 0   Continuous Glucose Sensor (FREESTYLE LIBRE 2 SENSOR) MISC Use as directed & change every 14 days to check blood sugar. 6 each 3   fluticasone (FLONASE) 50 MCG/ACT nasal spray Place 1 spray into both nostrils daily. 16 g 0   hydrOXYzine (ATARAX) 50 MG tablet Take 1 tablet (50 mg total) by mouth 3 (three) times daily as needed for anxiety. 60 tablet 3   insulin degludec (TRESIBA FLEXTOUCH) 200 UNIT/ML FlexTouch Pen Inject 12 Units into the skin in the morning and at bedtime. 9 mL 0   irbesartan (AVAPRO) 75 MG tablet Take 1 tablet (75 mg total) by mouth daily. 90 tablet 3   metFORMIN (GLUCOPHAGE-XR) 500 MG 24 hr tablet Take 2 tablets (1,000 mg total) by mouth 2 (two) times daily. Take with food 360 tablet 0   pravastatin (PRAVACHOL) 20 MG tablet Take 2 tablets (40 mg total) by mouth daily. 180 tablet 0   pravastatin (PRAVACHOL) 40 MG tablet Take 1 tablet (40 mg total) by mouth daily. 90 tablet 3   Rimegepant Sulfate (NURTEC) 75 MG TBDP Take 1 tablet (75 mg total) by mouth as needed. 8 tablet 6   rizatriptan (MAXALT) 5 MG tablet Take 1 tablet (5 mg total) by mouth as needed for migraine. May repeat in 2 hours if needed 10 tablet 3   spironolactone (ALDACTONE) 50 MG tablet Take 1 tablet (50 mg total) by mouth daily. 90 tablet 3   [DISCONTINUED] ferrous fumarate (HEMOCYTE - 106 MG FE) 325 (106 FE) MG TABS Take 1 tablet by mouth daily.        [DISCONTINUED] medroxyPROGESTERone (PROVERA) 10 MG tablet Take 10 mg by mouth daily.       No current facility-administered medications on file prior to visit.   Allergies  Allergen Reactions   Latex Rash   Family History  Adopted: Yes  Problem Relation Age of Onset   Hypertension Daughter  PE: BP (!) 146/92 Comment: BP checked x 2  Pulse 66   Ht 5' 5.5" (1.664 m)   Wt (!) 301 lb 3.2 oz (136.6 kg)   LMP 01/05/2011 (Exact Date)   SpO2 99%   BMI 49.36 kg/m  Wt Readings from Last 10 Encounters:  05/27/23 (!) 301 lb 3.2 oz (136.6 kg)  03/18/23 (!) 307 lb 12.8 oz (139.6 kg)  12/10/22 (!) 306 lb (138.8 kg)  10/05/22 (!) 311 lb 9.6 oz (141.3 kg)  09/17/22 (!) 313 lb 3.2 oz (142.1 kg)  09/09/22 (!) 309 lb 8 oz (140.4 kg)  09/03/22 (!) 308 lb 12.8 oz (140.1 kg)  09/01/22 (!) 314 lb (142.4 kg)  08/21/22 (!) 314 lb (142.4 kg)  06/03/22 (!) 314 lb 6.4 oz (142.6 kg)   Constitutional: overweight, in NAD Eyes:  EOMI, no exophthalmos ENT: no neck masses, no cervical lymphadenopathy Cardiovascular: RRR, No MRG Respiratory: CTA B Musculoskeletal: no deformities Skin:no rashes Neurological: no tremor with outstretched hands Diabetic Foot Exam - Simple   Simple Foot Form Diabetic Foot exam was performed with the following findings: Yes 05/27/2023  3:44 PM  Visual Inspection No deformities, no ulcerations, no other skin breakdown bilaterally: Yes Sensation Testing Intact to touch and monofilament testing bilaterally: Yes Pulse Check Posterior Tibialis and Dorsalis pulse intact bilaterally: Yes Comments    ASSESSMENT: 1. DM2, insulin-dependent, uncontrolled, without long term complications, but with hyperglycemia  2. Obesity  3. HL  PLAN:  1. Patient with history of uncontrolled type 2 diabetes, on oral antidiabetic regimen with metformin and long-acting insulin, with improved diabetes control after switching from night shift to day shift.  HbA1c at last visit, in 05/2022  was lower, at 7.3%.  At that time, she was having blood sugars in the 70s especially after delaying the meal so we discussed about reducing her Tresiba dose and I also suggested an SGLT2 inhibitor.  However, she was lost to follow-up for a year afterwards. CGM interpretation: -At today's visit, we reviewed her CGM downloads: It appears that 97% of values are in target range (goal >70%), while 3% are higher than 180 (goal <25%), and 0% are lower than 70 (goal <4%).  The calculated average blood sugar is 122.  The projected HbA1c for the next 3 months (GMI) is 6.2%. -Reviewing the CGM trends, sugars appear to be fluctuating within the target range, with only occasional hyperglycemic spikes.  She recently had such an instance after having to work a night shift.  Otherwise, sugars are at goal but she does describe occasional blood sugars in the upper 60s if she sleeps more during the weekend and the latest breakfast.  We did discuss that if these continue, she will have to reduce the dose of her insulin.  Otherwise, for now we can continue the current regimen. - I suggested to:  Patient Instructions  Please continue: - Metformin ER 1000 mg 2x a day with meals - Farxiga 5 mg before b'fast - Tresiba 22 units daily (may need to decrease the dose to 18-20 units)  Continue pravastatin 40 mg daily.  Please return in 6 months.  - we checked her HbA1c: 6.7% (lower) - advised to check sugars at different times of the day - 4x a day, rotating check times - advised for yearly eye exams >> she is UTD - will check a CMP today - return to clinic in 4 months  2. Obesity class 3 -Unfortunately, we cannot use a GLP-1 receptor agonist for her  as she developed pancreatitis while on Victoza.   -She previously had increased urination on chlorthalidone and also perineal infection, so we did not start SGLT2 inhibitors daily.  However, her infection is long healed and she wanted to try [place].  I recommended this at  last visit. -She lost 8 pounds before last visit, previously gained 46 before the prior 3 visits -She lost 13 pounds since last visit  3. HL -Latest lipid panel from 12/2021 was reviewed: LDL above target, otherwise fractions at goal Lab Results  Component Value Date   CHOL 190 12/10/2021   HDL 51.30 12/10/2021   LDLCALC 123 (H) 12/10/2021   LDLDIRECT 156.0 06/13/2018   TRIG 79.0 12/10/2021   CHOLHDL 4 12/10/2021  -She is on pravastatin 40 mg daily -will check lipid panel today  Component     Latest Ref Rng 05/27/2023  Cholesterol     0 - 200 mg/dL 865   Triglycerides     0.0 - 149.0 mg/dL 78.4   HDL Cholesterol     >39.00 mg/dL 69.62   VLDL     0.0 - 40.0 mg/dL 95.2   Total CHOL/HDL Ratio 4   NonHDL 145.43   Sodium     135 - 145 mEq/L 141   Potassium     3.5 - 5.1 mEq/L 4.4   Chloride     96 - 112 mEq/L 105   CO2     19 - 32 mEq/L 27   Glucose     70 - 99 mg/dL 85   BUN     6 - 23 mg/dL 18   Creatinine     8.41 - 1.20 mg/dL 3.24   Calcium     8.4 - 10.5 mg/dL 9.9   LDL (calc)     0 - 99 mg/dL 401 (H)   Total Bilirubin     0.2 - 1.2 mg/dL 0.3   Alkaline Phosphatase     39 - 117 U/L 71   AST     0 - 37 U/L 11   ALT     0 - 35 U/L 11   Total Protein     6.0 - 8.3 g/dL 7.4   Albumin     3.5 - 5.2 g/dL 4.4   GFR     >02.72 mL/min 72.93   Labs are at goal with the exception of the high LDL.  She is on pravastatin 40 mg daily.  I will check with her if she is taking this consistently.  If not, she may need addition of Zetia 10 mg daily.  Carlus Pavlov, MD PhD [facility] Endocrinology

## 2023-05-27 NOTE — Patient Instructions (Addendum)
Please continue: - Metformin ER 1000 mg 2x a day with meals - Farxiga 5 mg before b'fast - Tresiba 22 units daily (may need to decrease the dose to 18-20)  Continue pravastatin 40 mg daily.  Please return in 6 months.

## 2023-05-28 ENCOUNTER — Encounter: Payer: Self-pay | Admitting: Internal Medicine

## 2023-05-28 ENCOUNTER — Other Ambulatory Visit (HOSPITAL_COMMUNITY): Payer: Self-pay

## 2023-05-28 LAB — COMPREHENSIVE METABOLIC PANEL
ALT: 11 U/L (ref 0–35)
AST: 11 U/L (ref 0–37)
Albumin: 4.4 g/dL (ref 3.5–5.2)
Alkaline Phosphatase: 71 U/L (ref 39–117)
BUN: 18 mg/dL (ref 6–23)
CO2: 27 meq/L (ref 19–32)
Calcium: 9.9 mg/dL (ref 8.4–10.5)
Chloride: 105 meq/L (ref 96–112)
Creatinine, Ser: 0.95 mg/dL (ref 0.40–1.20)
GFR: 72.93 mL/min (ref >60.00)
Glucose, Bld: 85 mg/dL (ref 70–99)
Potassium: 4.4 meq/L (ref 3.5–5.1)
Sodium: 141 meq/L (ref 135–145)
Total Bilirubin: 0.3 mg/dL (ref 0.2–1.2)
Total Protein: 7.4 g/dL (ref 6.0–8.3)

## 2023-05-28 LAB — LIPID PANEL
Cholesterol: 192 mg/dL (ref 0–200)
HDL: 46.8 mg/dL (ref >39.00)
LDL Cholesterol: 126 mg/dL — ABNORMAL HIGH (ref 0–99)
NonHDL: 145.43
Total CHOL/HDL Ratio: 4
Triglycerides: 99 mg/dL (ref 0.0–149.0)
VLDL: 19.8 mg/dL (ref 0.0–40.0)

## 2023-06-08 ENCOUNTER — Other Ambulatory Visit (HOSPITAL_COMMUNITY): Payer: Self-pay

## 2023-06-08 ENCOUNTER — Other Ambulatory Visit: Payer: Self-pay

## 2023-06-08 MED ORDER — METFORMIN HCL ER 500 MG PO TB24
1000.0000 mg | ORAL_TABLET | Freq: Two times a day (BID) | ORAL | 1 refills | Status: DC
  Filled 2023-06-08: qty 360, 90d supply, fill #0
  Filled 2023-09-07: qty 360, 90d supply, fill #1

## 2023-06-09 ENCOUNTER — Other Ambulatory Visit (HOSPITAL_COMMUNITY): Payer: Self-pay

## 2023-06-13 DIAGNOSIS — G4733 Obstructive sleep apnea (adult) (pediatric): Secondary | ICD-10-CM | POA: Diagnosis not present

## 2023-06-16 ENCOUNTER — Ambulatory Visit: Payer: Commercial Managed Care - PPO | Admitting: Family Medicine

## 2023-06-16 ENCOUNTER — Other Ambulatory Visit: Payer: Self-pay

## 2023-06-16 VITALS — BP 162/96 | HR 60 | Temp 98.7°F | Ht 65.5 in | Wt 300.8 lb

## 2023-06-16 DIAGNOSIS — Z7984 Long term (current) use of oral hypoglycemic drugs: Secondary | ICD-10-CM

## 2023-06-16 DIAGNOSIS — E1169 Type 2 diabetes mellitus with other specified complication: Secondary | ICD-10-CM

## 2023-06-16 DIAGNOSIS — I10 Essential (primary) hypertension: Secondary | ICD-10-CM | POA: Diagnosis not present

## 2023-06-16 DIAGNOSIS — E538 Deficiency of other specified B group vitamins: Secondary | ICD-10-CM | POA: Diagnosis not present

## 2023-06-16 DIAGNOSIS — E7841 Elevated Lipoprotein(a): Secondary | ICD-10-CM

## 2023-06-16 LAB — CBC WITH DIFFERENTIAL/PLATELET
Basophils Absolute: 0 10*3/uL (ref 0.0–0.1)
Basophils Relative: 0.6 % (ref 0.0–3.0)
Eosinophils Absolute: 0.2 10*3/uL (ref 0.0–0.7)
Eosinophils Relative: 2.1 % (ref 0.0–5.0)
HCT: 38.6 % (ref 36.0–46.0)
Hemoglobin: 12.1 g/dL (ref 12.0–15.0)
Lymphocytes Relative: 30.2 % (ref 12.0–46.0)
Lymphs Abs: 2.2 10*3/uL (ref 0.7–4.0)
MCHC: 31.4 g/dL (ref 30.0–36.0)
MCV: 82.3 fL (ref 78.0–100.0)
Monocytes Absolute: 0.4 10*3/uL (ref 0.1–1.0)
Monocytes Relative: 6 % (ref 3.0–12.0)
Neutro Abs: 4.5 10*3/uL (ref 1.4–7.7)
Neutrophils Relative %: 61.1 % (ref 43.0–77.0)
Platelets: 387 10*3/uL (ref 150.0–400.0)
RBC: 4.69 Mil/uL (ref 3.87–5.11)
RDW: 14.5 % (ref 11.5–15.5)
WBC: 7.3 10*3/uL (ref 4.0–10.5)

## 2023-06-16 LAB — T4, FREE: Free T4: 0.92 ng/dL (ref 0.60–1.60)

## 2023-06-16 LAB — VITAMIN B12: Vitamin B-12: 182 pg/mL — ABNORMAL LOW (ref 211–911)

## 2023-06-16 LAB — VITAMIN D 25 HYDROXY (VIT D DEFICIENCY, FRACTURES): VITD: 25.43 ng/mL — ABNORMAL LOW (ref 30.00–100.00)

## 2023-06-16 LAB — TSH: TSH: 2.11 u[IU]/mL (ref 0.35–5.50)

## 2023-06-16 NOTE — Progress Notes (Signed)
Established Patient Office Visit   Subjective  Patient ID: Rachel Dawson, female    DOB: October 29, 1980  Age: 42 y.o. MRN: [id number]  Chief Complaint  Patient presents with   Diabetes    Patient is a 42 year old female seen for follow-up on DM.  A1c 6.7% recently at endocrinology visit.  Pt started taking farxiga 10 mg 2 wks ago, insulin decreased some.  Continuing diet and exercise.  Requesting additional labs as was unable to have done at work.  Overall pt feeling good.  Still wanting to get bp better controlled.  Was supposed to start pravastatin 40 mg daily but taking 20 mg daily.  Has noticed myalgia, but was unsure if related to increased exercise.  Diabetes    Patient Active Problem List   Diagnosis Date Noted   Mild obstructive sleep apnea 12/10/2022   Snoring 09/17/2022   Breast lesion 07/24/2022   Herpes simplex 05/25/2022   Pain of left breast 02/19/2022   Type 2 diabetes mellitus with stage 2 chronic kidney disease, with long-term current use of insulin (HCC) 04/18/2020   Perirectal abscess 04/14/2020   Hyperlipidemia 09/23/2018   Anxiety and depression 02/08/2018   Diabetes mellitus (HCC) 10/07/2017   Acute mastitis 11/23/2014   Trichomonas vaginalis infection 11/23/2014   Morbid obesity with BMI of 45.0-49.9, adult (HCC) 11/23/2014   Steatohepatitis, non-alcoholic 11/23/2014   Hypertension, essential, benign 11/23/2014   Transaminitis 11/22/2014   Type 2 diabetes mellitus with hyperglycemia, with long-term current use of insulin (HCC) 11/22/2014   Moderate dehydration 11/22/2014   Nausea vomiting and diarrhea 11/22/2014   Microcytic anemia    Uterine fibroid    Hypertension    Past Medical History:  Diagnosis Date   Anemia    Fatty liver    History of blood transfusion    "when I had tonsils taken out & w/hyster"   Hypercholesterolemia    Hypertension    Menometrorrhagia    Microcytic anemia    Type II diabetes mellitus (HCC)    Vitamin D deficiency     Past Surgical History:  Procedure Laterality Date   ABDOMINAL HYSTERECTOMY  ~ 2012   CESAREAN SECTION  1999; 2004; 2006   HIP PINNING Bilateral ~ 1991   "balls had dropped out of their sockets"   TONSILLECTOMY AND ADENOIDECTOMY     TUBAL LIGATION  2006   Social History   Tobacco Use   Smoking status: Former    Current packs/day: 0.00    Average packs/day: 0.1 packs/day for 4.0 years (0.5 ttl pk-yrs)    Types: Cigarettes    Start date: 07/10/2010    Quit date: 07/10/2014    Years since quitting: 8.9   Smokeless tobacco: Never  Vaping Use   Vaping status: Never Used  Substance Use Topics   Alcohol use: Yes    Comment: occ   Drug use: No   Family History  Adopted: Yes  Problem Relation Age of Onset   Hypertension Daughter    Allergies  Allergen Reactions   Latex Rash      ROS Negative unless stated above    Objective:     BP (!) 148/92 (BP Location: Left Arm, Patient Position: Sitting, Cuff Size: Large)   Pulse 60   Temp 98.7 F (37.1 C) (Oral)   Ht 5' 5.5" (1.664 m)   Wt (!) 300 lb 12.8 oz (136.4 kg)   LMP 01/05/2011 (Exact Date)   SpO2 90%   BMI 49.29 kg/m  BP Readings  from Last 3 Encounters:  06/16/23 (!) 148/92  05/27/23 (!) 146/92  03/18/23 (!) 160/108   Wt Readings from Last 3 Encounters:  06/16/23 (!) 300 lb 12.8 oz (136.4 kg)  05/27/23 (!) 301 lb 3.2 oz (136.6 kg)  03/18/23 (!) 307 lb 12.8 oz (139.6 kg)      Physical Exam Constitutional:      General: She is not in acute distress.    Appearance: Normal appearance.  HENT:     Head: Normocephalic and atraumatic.     Nose: Nose normal.     Mouth/Throat:     Mouth: Mucous membranes are moist.  Cardiovascular:     Rate and Rhythm: Normal rate and regular rhythm.     Heart sounds: Normal heart sounds. No murmur heard.    No gallop.  Pulmonary:     Effort: Pulmonary effort is normal. No respiratory distress.     Breath sounds: Normal breath sounds. No wheezing, rhonchi or rales.   Skin:    General: Skin is warm and dry.  Neurological:     Mental Status: She is alert and oriented to person, place, and time.    No results found for any visits on 06/16/23.    Assessment & Plan:  Essential hypertension -     CBC with Differential/Platelet  Elevated lipoprotein(a) -Continue pravastatin 20 mg.  Given recent myalgias patient will take 2-3 times per week.  For continued myalgias will discontinue medication. -Continue lifestyle modifications  Vitamin B12 deficiency  Type 2 diabetes mellitus with other specified complication, without long-term current use of insulin (HCC) -  Morbid obesity (HCC) -Body mass index is 49.29 kg/m. -Discussed various weight loss medication options.  Will wait until current conditions including DM 2 and BP better controlled before starting additional medication. -Would avoid GLP-1s due to history of pancreatitis on Victoza. -Continue lifestyle modifications including exercise -     VITAMIN D 25 Hydroxy (Vit-D Deficiency, Fractures) -     T4, free -     TSH -     Vitamin B12 -     CBC with Differential/Platelet  BP improving but remains uncontrolled.  Advised may likely see further improvement with consistent [place] use.  Continue irbesartan 75 mg daily and spironolactone 50 mg daily.  Patient will check BP for the next 2 weeks.  If readings consistently greater than 140/90 she will notify clinic so or restarting dose can be increased from 75 to 150 mg daily.  Continue lifestyle modifications  DM controlled.  Recent A1c 6.7% at endocrinologist.  Discussed continuing Farxiga 5 mg daily, metformin XR 1000 mg twice daily, Tresiba 22 units in AM and nightly.  Labs from visit 03/18/2023 to be collected this visit  No follow-ups on file.   Deeann Saint, MD

## 2023-06-16 NOTE — Addendum Note (Signed)
Addended by: Sumner Boast on: 06/16/2023 04:27 PM   Modules accepted: Orders

## 2023-06-17 ENCOUNTER — Other Ambulatory Visit: Payer: Self-pay | Admitting: Family Medicine

## 2023-06-17 ENCOUNTER — Other Ambulatory Visit (HOSPITAL_COMMUNITY): Payer: Self-pay

## 2023-06-17 DIAGNOSIS — E559 Vitamin D deficiency, unspecified: Secondary | ICD-10-CM | POA: Insufficient documentation

## 2023-06-17 DIAGNOSIS — E538 Deficiency of other specified B group vitamins: Secondary | ICD-10-CM | POA: Insufficient documentation

## 2023-06-17 MED ORDER — VITAMIN D (ERGOCALCIFEROL) 1.25 MG (50000 UNIT) PO CAPS
50000.0000 [IU] | ORAL_CAPSULE | ORAL | 0 refills | Status: DC
  Filled 2023-06-17 – 2023-06-21 (×2): qty 12, 84d supply, fill #0

## 2023-06-18 ENCOUNTER — Other Ambulatory Visit (HOSPITAL_COMMUNITY): Payer: Self-pay

## 2023-06-21 ENCOUNTER — Other Ambulatory Visit (HOSPITAL_BASED_OUTPATIENT_CLINIC_OR_DEPARTMENT_OTHER): Payer: Self-pay

## 2023-06-22 ENCOUNTER — Encounter: Payer: Self-pay | Admitting: Family Medicine

## 2023-06-22 ENCOUNTER — Encounter (HOSPITAL_COMMUNITY): Payer: Self-pay

## 2023-06-22 ENCOUNTER — Other Ambulatory Visit (HOSPITAL_COMMUNITY): Payer: Self-pay

## 2023-06-23 ENCOUNTER — Other Ambulatory Visit: Payer: Self-pay | Admitting: Family Medicine

## 2023-06-23 DIAGNOSIS — I10 Essential (primary) hypertension: Secondary | ICD-10-CM

## 2023-06-23 MED ORDER — SPIRONOLACTONE 50 MG PO TABS
50.0000 mg | ORAL_TABLET | Freq: Every day | ORAL | 3 refills | Status: AC
  Filled 2023-06-23 – 2023-09-14 (×3): qty 90, 90d supply, fill #0
  Filled 2023-12-06: qty 90, 90d supply, fill #1
  Filled 2024-03-08: qty 90, 90d supply, fill #2
  Filled 2024-06-16: qty 90, 90d supply, fill #3

## 2023-06-24 ENCOUNTER — Other Ambulatory Visit (HOSPITAL_BASED_OUTPATIENT_CLINIC_OR_DEPARTMENT_OTHER): Payer: Self-pay

## 2023-06-24 DIAGNOSIS — G4733 Obstructive sleep apnea (adult) (pediatric): Secondary | ICD-10-CM | POA: Diagnosis not present

## 2023-06-25 ENCOUNTER — Other Ambulatory Visit: Payer: Self-pay | Admitting: Internal Medicine

## 2023-06-25 ENCOUNTER — Other Ambulatory Visit (HOSPITAL_BASED_OUTPATIENT_CLINIC_OR_DEPARTMENT_OTHER): Payer: Self-pay

## 2023-06-25 ENCOUNTER — Telehealth: Payer: Self-pay

## 2023-06-25 DIAGNOSIS — E1165 Type 2 diabetes mellitus with hyperglycemia: Secondary | ICD-10-CM

## 2023-06-25 MED ORDER — TRESIBA FLEXTOUCH 200 UNIT/ML ~~LOC~~ SOPN
22.0000 [IU] | PEN_INJECTOR | Freq: Every day | SUBCUTANEOUS | 2 refills | Status: DC
  Filled 2023-06-25 – 2023-07-06 (×2): qty 9, 82d supply, fill #0
  Filled 2023-09-14: qty 9, 82d supply, fill #1
  Filled 2023-12-06: qty 9, 82d supply, fill #2

## 2023-06-25 MED ORDER — TRESIBA FLEXTOUCH 200 UNIT/ML ~~LOC~~ SOPN
11.0000 [IU] | PEN_INJECTOR | Freq: Two times a day (BID) | SUBCUTANEOUS | 0 refills | Status: DC
  Filled 2023-06-25: qty 9, 75d supply, fill #0

## 2023-06-25 NOTE — Telephone Encounter (Signed)
I received a vm from [employer] about this patients [place]. The last one they had on file mentioned 11-12 units but in the last office note it states to take 22 units daily and may have to decrease. 18-20 units a day.   Updated script has been sent.   Requested Prescriptions   Signed Prescriptions Disp Refills   insulin degludec (TRESIBA FLEXTOUCH) 200 UNIT/ML FlexTouch Pen 18 mL 2    Sig: Inject 22 Units into the skin daily.    Authorizing Provider: Carlus Pavlov    Ordering User: Pollie Meyer

## 2023-06-28 ENCOUNTER — Other Ambulatory Visit (HOSPITAL_COMMUNITY): Payer: Self-pay

## 2023-07-05 ENCOUNTER — Other Ambulatory Visit (HOSPITAL_BASED_OUTPATIENT_CLINIC_OR_DEPARTMENT_OTHER): Payer: Self-pay

## 2023-07-06 ENCOUNTER — Other Ambulatory Visit (HOSPITAL_BASED_OUTPATIENT_CLINIC_OR_DEPARTMENT_OTHER): Payer: Self-pay

## 2023-07-12 ENCOUNTER — Encounter: Payer: Self-pay | Admitting: Family Medicine

## 2023-07-13 DIAGNOSIS — G4733 Obstructive sleep apnea (adult) (pediatric): Secondary | ICD-10-CM | POA: Diagnosis not present

## 2023-07-22 ENCOUNTER — Other Ambulatory Visit (HOSPITAL_COMMUNITY): Payer: Self-pay

## 2023-07-24 DIAGNOSIS — G4733 Obstructive sleep apnea (adult) (pediatric): Secondary | ICD-10-CM | POA: Diagnosis not present

## 2023-08-09 ENCOUNTER — Other Ambulatory Visit: Payer: Self-pay | Admitting: Internal Medicine

## 2023-08-09 ENCOUNTER — Other Ambulatory Visit (HOSPITAL_BASED_OUTPATIENT_CLINIC_OR_DEPARTMENT_OTHER): Payer: Self-pay

## 2023-08-09 ENCOUNTER — Encounter: Payer: Self-pay | Admitting: Internal Medicine

## 2023-08-09 MED ORDER — FLUCONAZOLE 150 MG PO TABS
150.0000 mg | ORAL_TABLET | Freq: Once | ORAL | 1 refills | Status: AC
  Filled 2023-08-09: qty 1, 1d supply, fill #0

## 2023-08-18 ENCOUNTER — Other Ambulatory Visit (HOSPITAL_BASED_OUTPATIENT_CLINIC_OR_DEPARTMENT_OTHER): Payer: Self-pay

## 2023-08-18 MED ORDER — DAPAGLIFLOZIN PROPANEDIOL 5 MG PO TABS
5.0000 mg | ORAL_TABLET | Freq: Every day | ORAL | 3 refills | Status: DC
  Filled 2023-08-18: qty 30, 30d supply, fill #0
  Filled 2023-09-14: qty 30, 30d supply, fill #1
  Filled 2023-10-18: qty 30, 30d supply, fill #2
  Filled 2023-11-13: qty 30, 30d supply, fill #3

## 2023-08-18 NOTE — Addendum Note (Signed)
 Addended by: Pollie Meyer on: 08/18/2023 09:15 AM   Modules accepted: Orders

## 2023-08-24 DIAGNOSIS — G4733 Obstructive sleep apnea (adult) (pediatric): Secondary | ICD-10-CM | POA: Diagnosis not present

## 2023-09-02 ENCOUNTER — Other Ambulatory Visit (HOSPITAL_BASED_OUTPATIENT_CLINIC_OR_DEPARTMENT_OTHER): Payer: Self-pay

## 2023-09-02 ENCOUNTER — Other Ambulatory Visit: Payer: Self-pay | Admitting: Internal Medicine

## 2023-09-02 MED ORDER — FREESTYLE LIBRE 2 SENSOR MISC
1.0000 | 3 refills | Status: DC
  Filled 2023-09-02: qty 6, 84d supply, fill #0
  Filled 2023-12-06: qty 6, 84d supply, fill #1

## 2023-09-14 ENCOUNTER — Other Ambulatory Visit: Payer: Self-pay | Admitting: Family Medicine

## 2023-09-14 ENCOUNTER — Other Ambulatory Visit (HOSPITAL_BASED_OUTPATIENT_CLINIC_OR_DEPARTMENT_OTHER): Payer: Self-pay

## 2023-09-14 DIAGNOSIS — J3489 Other specified disorders of nose and nasal sinuses: Secondary | ICD-10-CM

## 2023-09-14 DIAGNOSIS — J069 Acute upper respiratory infection, unspecified: Secondary | ICD-10-CM

## 2023-09-15 ENCOUNTER — Other Ambulatory Visit (HOSPITAL_BASED_OUTPATIENT_CLINIC_OR_DEPARTMENT_OTHER): Payer: Self-pay

## 2023-09-15 MED ORDER — FLUTICASONE PROPIONATE 50 MCG/ACT NA SUSP
1.0000 | Freq: Every day | NASAL | 0 refills | Status: AC
  Filled 2023-09-15 – 2023-10-27 (×2): qty 16, 60d supply, fill #0

## 2023-09-16 ENCOUNTER — Other Ambulatory Visit (HOSPITAL_BASED_OUTPATIENT_CLINIC_OR_DEPARTMENT_OTHER): Payer: Self-pay

## 2023-09-17 ENCOUNTER — Other Ambulatory Visit (HOSPITAL_BASED_OUTPATIENT_CLINIC_OR_DEPARTMENT_OTHER): Payer: Self-pay

## 2023-09-20 ENCOUNTER — Other Ambulatory Visit (HOSPITAL_BASED_OUTPATIENT_CLINIC_OR_DEPARTMENT_OTHER): Payer: Self-pay

## 2023-09-21 ENCOUNTER — Other Ambulatory Visit (HOSPITAL_BASED_OUTPATIENT_CLINIC_OR_DEPARTMENT_OTHER): Payer: Self-pay

## 2023-09-22 ENCOUNTER — Other Ambulatory Visit (HOSPITAL_BASED_OUTPATIENT_CLINIC_OR_DEPARTMENT_OTHER): Payer: Self-pay

## 2023-09-22 ENCOUNTER — Other Ambulatory Visit: Payer: Self-pay

## 2023-09-23 ENCOUNTER — Other Ambulatory Visit (HOSPITAL_BASED_OUTPATIENT_CLINIC_OR_DEPARTMENT_OTHER): Payer: Self-pay

## 2023-09-24 ENCOUNTER — Other Ambulatory Visit (HOSPITAL_BASED_OUTPATIENT_CLINIC_OR_DEPARTMENT_OTHER): Payer: Self-pay

## 2023-09-24 DIAGNOSIS — G4733 Obstructive sleep apnea (adult) (pediatric): Secondary | ICD-10-CM | POA: Diagnosis not present

## 2023-09-27 ENCOUNTER — Other Ambulatory Visit (HOSPITAL_BASED_OUTPATIENT_CLINIC_OR_DEPARTMENT_OTHER): Payer: Self-pay

## 2023-09-28 ENCOUNTER — Other Ambulatory Visit (HOSPITAL_BASED_OUTPATIENT_CLINIC_OR_DEPARTMENT_OTHER): Payer: Self-pay

## 2023-09-29 ENCOUNTER — Other Ambulatory Visit (HOSPITAL_BASED_OUTPATIENT_CLINIC_OR_DEPARTMENT_OTHER): Payer: Self-pay

## 2023-09-30 ENCOUNTER — Other Ambulatory Visit (HOSPITAL_COMMUNITY): Payer: Self-pay

## 2023-09-30 ENCOUNTER — Other Ambulatory Visit (HOSPITAL_BASED_OUTPATIENT_CLINIC_OR_DEPARTMENT_OTHER): Payer: Self-pay

## 2023-10-01 ENCOUNTER — Other Ambulatory Visit (HOSPITAL_BASED_OUTPATIENT_CLINIC_OR_DEPARTMENT_OTHER): Payer: Self-pay

## 2023-10-04 ENCOUNTER — Other Ambulatory Visit (HOSPITAL_BASED_OUTPATIENT_CLINIC_OR_DEPARTMENT_OTHER): Payer: Self-pay

## 2023-10-05 ENCOUNTER — Other Ambulatory Visit (HOSPITAL_BASED_OUTPATIENT_CLINIC_OR_DEPARTMENT_OTHER): Payer: Self-pay

## 2023-10-07 ENCOUNTER — Other Ambulatory Visit (HOSPITAL_BASED_OUTPATIENT_CLINIC_OR_DEPARTMENT_OTHER): Payer: Self-pay

## 2023-10-08 ENCOUNTER — Other Ambulatory Visit (HOSPITAL_BASED_OUTPATIENT_CLINIC_OR_DEPARTMENT_OTHER): Payer: Self-pay

## 2023-10-12 ENCOUNTER — Other Ambulatory Visit (HOSPITAL_BASED_OUTPATIENT_CLINIC_OR_DEPARTMENT_OTHER): Payer: Self-pay

## 2023-10-13 ENCOUNTER — Other Ambulatory Visit (HOSPITAL_BASED_OUTPATIENT_CLINIC_OR_DEPARTMENT_OTHER): Payer: Self-pay

## 2023-10-14 ENCOUNTER — Other Ambulatory Visit (HOSPITAL_BASED_OUTPATIENT_CLINIC_OR_DEPARTMENT_OTHER): Payer: Self-pay

## 2023-10-15 ENCOUNTER — Other Ambulatory Visit (HOSPITAL_BASED_OUTPATIENT_CLINIC_OR_DEPARTMENT_OTHER): Payer: Self-pay

## 2023-10-18 ENCOUNTER — Encounter (HOSPITAL_BASED_OUTPATIENT_CLINIC_OR_DEPARTMENT_OTHER): Payer: Self-pay

## 2023-10-18 ENCOUNTER — Other Ambulatory Visit: Payer: Self-pay

## 2023-10-18 ENCOUNTER — Other Ambulatory Visit (HOSPITAL_BASED_OUTPATIENT_CLINIC_OR_DEPARTMENT_OTHER): Payer: Self-pay

## 2023-10-19 ENCOUNTER — Other Ambulatory Visit (HOSPITAL_BASED_OUTPATIENT_CLINIC_OR_DEPARTMENT_OTHER): Payer: Self-pay

## 2023-10-20 ENCOUNTER — Other Ambulatory Visit (HOSPITAL_BASED_OUTPATIENT_CLINIC_OR_DEPARTMENT_OTHER): Payer: Self-pay

## 2023-10-21 ENCOUNTER — Other Ambulatory Visit (HOSPITAL_BASED_OUTPATIENT_CLINIC_OR_DEPARTMENT_OTHER): Payer: Self-pay

## 2023-10-22 DIAGNOSIS — G4733 Obstructive sleep apnea (adult) (pediatric): Secondary | ICD-10-CM | POA: Diagnosis not present

## 2023-10-27 ENCOUNTER — Other Ambulatory Visit (HOSPITAL_BASED_OUTPATIENT_CLINIC_OR_DEPARTMENT_OTHER): Payer: Self-pay

## 2023-10-27 ENCOUNTER — Other Ambulatory Visit: Payer: Self-pay

## 2023-10-28 ENCOUNTER — Other Ambulatory Visit (HOSPITAL_BASED_OUTPATIENT_CLINIC_OR_DEPARTMENT_OTHER): Payer: Self-pay

## 2023-10-31 ENCOUNTER — Encounter: Payer: Self-pay | Admitting: Nurse Practitioner

## 2023-11-01 ENCOUNTER — Other Ambulatory Visit (HOSPITAL_BASED_OUTPATIENT_CLINIC_OR_DEPARTMENT_OTHER): Payer: Self-pay

## 2023-11-02 ENCOUNTER — Other Ambulatory Visit (HOSPITAL_BASED_OUTPATIENT_CLINIC_OR_DEPARTMENT_OTHER): Payer: Self-pay

## 2023-11-03 ENCOUNTER — Other Ambulatory Visit (HOSPITAL_BASED_OUTPATIENT_CLINIC_OR_DEPARTMENT_OTHER): Payer: Self-pay

## 2023-11-04 ENCOUNTER — Other Ambulatory Visit (HOSPITAL_BASED_OUTPATIENT_CLINIC_OR_DEPARTMENT_OTHER): Payer: Self-pay

## 2023-11-05 ENCOUNTER — Other Ambulatory Visit (HOSPITAL_BASED_OUTPATIENT_CLINIC_OR_DEPARTMENT_OTHER): Payer: Self-pay

## 2023-11-08 ENCOUNTER — Other Ambulatory Visit (HOSPITAL_BASED_OUTPATIENT_CLINIC_OR_DEPARTMENT_OTHER): Payer: Self-pay

## 2023-11-09 ENCOUNTER — Other Ambulatory Visit (HOSPITAL_BASED_OUTPATIENT_CLINIC_OR_DEPARTMENT_OTHER): Payer: Self-pay

## 2023-11-10 ENCOUNTER — Other Ambulatory Visit (HOSPITAL_BASED_OUTPATIENT_CLINIC_OR_DEPARTMENT_OTHER): Payer: Self-pay

## 2023-11-11 ENCOUNTER — Other Ambulatory Visit (HOSPITAL_BASED_OUTPATIENT_CLINIC_OR_DEPARTMENT_OTHER): Payer: Self-pay

## 2023-11-12 ENCOUNTER — Other Ambulatory Visit (HOSPITAL_BASED_OUTPATIENT_CLINIC_OR_DEPARTMENT_OTHER): Payer: Self-pay

## 2023-11-15 ENCOUNTER — Other Ambulatory Visit (HOSPITAL_BASED_OUTPATIENT_CLINIC_OR_DEPARTMENT_OTHER): Payer: Self-pay

## 2023-11-16 ENCOUNTER — Other Ambulatory Visit (HOSPITAL_BASED_OUTPATIENT_CLINIC_OR_DEPARTMENT_OTHER): Payer: Self-pay

## 2023-11-17 ENCOUNTER — Encounter: Payer: Self-pay | Admitting: Family Medicine

## 2023-11-17 ENCOUNTER — Other Ambulatory Visit (HOSPITAL_BASED_OUTPATIENT_CLINIC_OR_DEPARTMENT_OTHER): Payer: Self-pay

## 2023-11-18 ENCOUNTER — Other Ambulatory Visit: Payer: Self-pay | Admitting: Family Medicine

## 2023-11-18 ENCOUNTER — Other Ambulatory Visit (HOSPITAL_COMMUNITY): Payer: Self-pay

## 2023-11-18 ENCOUNTER — Other Ambulatory Visit (HOSPITAL_BASED_OUTPATIENT_CLINIC_OR_DEPARTMENT_OTHER): Payer: Self-pay

## 2023-11-18 ENCOUNTER — Other Ambulatory Visit: Payer: Self-pay

## 2023-11-18 DIAGNOSIS — I10 Essential (primary) hypertension: Secondary | ICD-10-CM

## 2023-11-18 MED ORDER — IRBESARTAN 150 MG PO TABS
150.0000 mg | ORAL_TABLET | Freq: Every day | ORAL | 1 refills | Status: DC
  Filled 2023-11-18 – 2023-12-06 (×3): qty 90, 90d supply, fill #0
  Filled 2024-03-08: qty 90, 90d supply, fill #1

## 2023-11-23 ENCOUNTER — Other Ambulatory Visit (HOSPITAL_BASED_OUTPATIENT_CLINIC_OR_DEPARTMENT_OTHER): Payer: Self-pay

## 2023-11-24 DIAGNOSIS — G4733 Obstructive sleep apnea (adult) (pediatric): Secondary | ICD-10-CM | POA: Diagnosis not present

## 2023-11-25 ENCOUNTER — Other Ambulatory Visit (HOSPITAL_COMMUNITY): Payer: Self-pay

## 2023-11-29 ENCOUNTER — Ambulatory Visit: Payer: Commercial Managed Care - PPO | Admitting: Internal Medicine

## 2023-12-06 ENCOUNTER — Other Ambulatory Visit: Payer: Self-pay

## 2023-12-06 ENCOUNTER — Other Ambulatory Visit: Payer: Self-pay | Admitting: Internal Medicine

## 2023-12-06 ENCOUNTER — Other Ambulatory Visit (HOSPITAL_BASED_OUTPATIENT_CLINIC_OR_DEPARTMENT_OTHER): Payer: Self-pay

## 2023-12-07 ENCOUNTER — Other Ambulatory Visit: Payer: Self-pay

## 2023-12-07 ENCOUNTER — Other Ambulatory Visit (HOSPITAL_BASED_OUTPATIENT_CLINIC_OR_DEPARTMENT_OTHER): Payer: Self-pay

## 2023-12-08 ENCOUNTER — Other Ambulatory Visit (HOSPITAL_BASED_OUTPATIENT_CLINIC_OR_DEPARTMENT_OTHER): Payer: Self-pay

## 2023-12-08 ENCOUNTER — Other Ambulatory Visit: Payer: Self-pay

## 2023-12-08 MED ORDER — METFORMIN HCL ER 500 MG PO TB24
1000.0000 mg | ORAL_TABLET | Freq: Two times a day (BID) | ORAL | 1 refills | Status: DC
  Filled 2023-12-08: qty 360, 90d supply, fill #0
  Filled 2024-03-08: qty 360, 90d supply, fill #1

## 2023-12-08 MED ORDER — DAPAGLIFLOZIN PROPANEDIOL 5 MG PO TABS
5.0000 mg | ORAL_TABLET | Freq: Every day | ORAL | 3 refills | Status: DC
  Filled 2023-12-08: qty 30, 30d supply, fill #0
  Filled 2024-01-14: qty 30, 30d supply, fill #1

## 2023-12-09 ENCOUNTER — Other Ambulatory Visit (HOSPITAL_BASED_OUTPATIENT_CLINIC_OR_DEPARTMENT_OTHER): Payer: Self-pay

## 2023-12-09 ENCOUNTER — Encounter: Payer: Self-pay | Admitting: Internal Medicine

## 2023-12-09 MED ORDER — PRAVASTATIN SODIUM 40 MG PO TABS
40.0000 mg | ORAL_TABLET | Freq: Every day | ORAL | 3 refills | Status: AC
  Filled 2023-12-09: qty 90, 90d supply, fill #0
  Filled 2024-03-08: qty 90, 90d supply, fill #1
  Filled 2024-06-16: qty 90, 90d supply, fill #2

## 2023-12-14 ENCOUNTER — Other Ambulatory Visit (HOSPITAL_BASED_OUTPATIENT_CLINIC_OR_DEPARTMENT_OTHER): Payer: Self-pay

## 2023-12-24 ENCOUNTER — Encounter: Payer: Self-pay | Admitting: Family Medicine

## 2023-12-27 ENCOUNTER — Telehealth: Admitting: Family Medicine

## 2023-12-27 ENCOUNTER — Other Ambulatory Visit (HOSPITAL_BASED_OUTPATIENT_CLINIC_OR_DEPARTMENT_OTHER): Payer: Self-pay

## 2023-12-27 ENCOUNTER — Encounter: Payer: Self-pay | Admitting: Family Medicine

## 2023-12-27 DIAGNOSIS — Z7184 Encounter for health counseling related to travel: Secondary | ICD-10-CM | POA: Diagnosis not present

## 2023-12-27 DIAGNOSIS — I10 Essential (primary) hypertension: Secondary | ICD-10-CM | POA: Diagnosis not present

## 2023-12-27 DIAGNOSIS — Z794 Long term (current) use of insulin: Secondary | ICD-10-CM

## 2023-12-27 DIAGNOSIS — R110 Nausea: Secondary | ICD-10-CM

## 2023-12-27 DIAGNOSIS — E1122 Type 2 diabetes mellitus with diabetic chronic kidney disease: Secondary | ICD-10-CM

## 2023-12-27 DIAGNOSIS — N182 Chronic kidney disease, stage 2 (mild): Secondary | ICD-10-CM | POA: Diagnosis not present

## 2023-12-27 DIAGNOSIS — J069 Acute upper respiratory infection, unspecified: Secondary | ICD-10-CM

## 2023-12-27 MED ORDER — SCOPOLAMINE 1 MG/3DAYS TD PT72
1.0000 | MEDICATED_PATCH | TRANSDERMAL | 0 refills | Status: AC
  Filled 2023-12-27: qty 2, 6d supply, fill #0

## 2023-12-27 MED ORDER — ONDANSETRON HCL 4 MG PO TABS
4.0000 mg | ORAL_TABLET | Freq: Three times a day (TID) | ORAL | 0 refills | Status: AC | PRN
  Filled 2023-12-27: qty 12, 4d supply, fill #0

## 2023-12-27 NOTE — Progress Notes (Signed)
 Patient was unable to self-report due to a lack of equipment at home via telehealth

## 2023-12-27 NOTE — Progress Notes (Signed)
 Virtual Visit via Video Note  I connected with Rachel Dawson on 12/27/23 at  3:30 PM EDT by a video enabled telemedicine application and verified that I am speaking with the correct person using two identifiers.  Location patient: home Location provider:work or home office Persons participating in the virtual visit: patient, provider  I discussed the limitations of evaluation and management by telemedicine and the availability of in person appointments. The patient expressed understanding and agreed to proceed.   HPI: Pt  is a 43 yo female seen for acute illness and travel advice.   Throat started feeling sore on Thursday.  On Friday throat continued hurting and body felt tired.  Feels like she "swallowed a cactus".  Has not seen any patches in the back of her throat.  Throat has improved today.  Tmax 101F.  Pt is also coughing, has rhinorrhea, and nasal congestion.  Endorses HA that is unresolved with Tylenol  or migraine med.  Taking mucinex and drinking more fluid.  Pt nauseous all day.  Unsure if Mucinex is causing as it typically spikes her bs.  Pt thinks she has diabetic testing at home.  Looking to see if she has COVID test at home.  If not we will try to get one of her children to get 1.  Going on a cruise 5/29.  It is 4 days.  Requesting med for sea sickness patches.  This is pt's first cruise.   ROS: See pertinent positives and negatives per HPI.  Past Medical History:  Diagnosis Date   Anemia    Fatty liver    History of blood transfusion    "when I had tonsils taken out & w/hyster"   Hypercholesterolemia    Hypertension    Menometrorrhagia    Microcytic anemia    Type II diabetes mellitus (HCC)    Vitamin D  deficiency     Past Surgical History:  Procedure Laterality Date   ABDOMINAL HYSTERECTOMY  ~ 2012   CESAREAN SECTION  1999; 2004; 2006   HIP PINNING Bilateral ~ 1991   "balls had dropped out of their sockets"   TONSILLECTOMY AND ADENOIDECTOMY     TUBAL LIGATION   2006    Family History  Adopted: Yes  Problem Relation Age of Onset   Hypertension Daughter      Current Outpatient Medications:    BD PEN NEEDLE NANO 2ND GEN 32G X 4 MM MISC, USE ONE NEEDLE TWICE A DAY, Disp: 200 each, Rfl: 3   Blood Pressure Monitoring (BLOOD PRESSURE MONITOR/L CUFF) MISC, Check your blood pressure at the same time each day., Disp: 1 each, Rfl: 0   Continuous Blood Gluc Receiver (FREESTYLE LIBRE 2 READER) DEVI, 1 each by Does not apply route daily., Disp: 1 each, Rfl: 0   Continuous Glucose Sensor (FREESTYLE LIBRE 2 SENSOR) MISC, APPLY 1 SENSOR EVERY 14 (FOURTEEN) DAYS, Disp: , Rfl:    Continuous Glucose Sensor (FREESTYLE LIBRE 2 SENSOR) MISC, Use as directed & change every 14 days to check blood sugar., Disp: 6 each, Rfl: 3   dapagliflozin  propanediol (FARXIGA ) 5 MG TABS tablet, Take 1 tablet (5 mg total) by mouth daily before breakfast., Disp: 30 tablet, Rfl: 3   fluticasone  (FLONASE ) 50 MCG/ACT nasal spray, Place 1 spray into both nostrils daily., Disp: 16 g, Rfl: 0   glucose blood test strip, Use as instructed once a day, Disp: 100 each, Rfl: 3   hydrOXYzine  (ATARAX ) 50 MG tablet, Take 1 tablet (50 mg total) by mouth  3 (three) times daily as needed for anxiety., Disp: 60 tablet, Rfl: 3   insulin  degludec (TRESIBA  FLEXTOUCH) 200 UNIT/ML FlexTouch Pen, Inject 22 Units into the skin daily., Disp: 18 mL, Rfl: 2   Insulin  Pen Needle (BD PEN NEEDLE NANO 2ND GEN) 32G X 4 MM MISC, USE ONE NEEDLE TWICE A DAY, Disp: , Rfl:    irbesartan  (AVAPRO ) 150 MG tablet, Take 1 tablet (150 mg total) by mouth daily., Disp: 90 tablet, Rfl: 1   metFORMIN  (GLUCOPHAGE -XR) 500 MG 24 hr tablet, Take 2 tablets (1,000 mg total) by mouth 2 (two) times daily. Take with food, Disp: 360 tablet, Rfl: 1   pravastatin  (PRAVACHOL ) 40 MG tablet, Take 1 tablet (40 mg total) by mouth daily., Disp: 90 tablet, Rfl: 3   Rimegepant Sulfate  (NURTEC) 75 MG TBDP, Take 1 tablet (75 mg total) by mouth as needed.,  Disp: 8 tablet, Rfl: 6   spironolactone  (ALDACTONE ) 50 MG tablet, Take 1 tablet (50 mg total) by mouth daily., Disp: 90 tablet, Rfl: 3   Vitamin D , Ergocalciferol , (DRISDOL ) 1.25 MG (50000 UNIT) CAPS capsule, Take 1 capsule (50,000 Units total) by mouth every 7 (seven) days., Disp: 12 capsule, Rfl: 0  EXAM:  VITALS per patient if applicable:  RR between 12-20 bpm  GENERAL: alert, oriented, appears sick, nontoxic and in no acute distress  HEENT: atraumatic, conjunctiva clear, no obvious abnormalities on inspection of external nose and ears  NECK: normal movements of the head and neck  LUNGS: on inspection no signs of respiratory distress, breathing rate appears normal, no obvious gross SOB, gasping or wheezing  CV: no obvious cyanosis  MS: moves all visible extremities without noticeable abnormality  PSYCH/NEURO: pleasant and cooperative, no obvious depression or anxiety, speech and thought processing grossly intact  ASSESSMENT AND PLAN:  Discussed the following assessment and plan:  Viral URI  Nausea - Plan: ondansetron  (ZOFRAN ) 4 MG tablet  Travel advice encounter - Plan: scopolamine  (TRANSDERM-SCOP) 1 MG/3DAYS  Type 2 diabetes mellitus with stage 2 chronic kidney disease, with long-term current use of insulin  (HCC)  Essential hypertension  Patient with acute viral URI symptoms.  Discussed continuing supportive care with OTC medications.  Consider stopping Mucinex as may elevate BP and blood sugar.  Zofran  sent to pharmacy for nausea.  Okay to use Safetussin, diabetic tussin, Coricidin HBP, gargling with warm salt water /Chloraseptic spray, rest, hydration, Tylenol .  Patient to check COVID test.  Given strict precautions.  Continue monitoring blood sugar via CGM.  Monitor blood pressure.  Rx for scopolamine  sent to pharmacy for upcoming cruise.   I discussed the assessment and treatment plan with the patient. The patient was provided an opportunity to ask questions and all  were answered. The patient agreed with the plan and demonstrated an understanding of the instructions.   The patient was advised to call back or seek an in-person evaluation if the symptoms worsen or if the condition fails to improve as anticipated.  , Viola Greulich, MD

## 2024-01-14 ENCOUNTER — Other Ambulatory Visit: Payer: Self-pay

## 2024-01-14 ENCOUNTER — Other Ambulatory Visit (HOSPITAL_BASED_OUTPATIENT_CLINIC_OR_DEPARTMENT_OTHER): Payer: Self-pay

## 2024-01-14 ENCOUNTER — Other Ambulatory Visit: Payer: Self-pay | Admitting: Family Medicine

## 2024-01-14 DIAGNOSIS — J3489 Other specified disorders of nose and nasal sinuses: Secondary | ICD-10-CM

## 2024-01-14 DIAGNOSIS — J069 Acute upper respiratory infection, unspecified: Secondary | ICD-10-CM

## 2024-02-10 ENCOUNTER — Ambulatory Visit: Admitting: Internal Medicine

## 2024-02-10 ENCOUNTER — Other Ambulatory Visit (HOSPITAL_BASED_OUTPATIENT_CLINIC_OR_DEPARTMENT_OTHER): Payer: Self-pay

## 2024-02-10 ENCOUNTER — Other Ambulatory Visit: Payer: Self-pay

## 2024-02-10 ENCOUNTER — Encounter: Payer: Self-pay | Admitting: Internal Medicine

## 2024-02-10 VITALS — BP 146/80 | HR 68 | Ht 65.5 in | Wt 300.0 lb

## 2024-02-10 DIAGNOSIS — Z794 Long term (current) use of insulin: Secondary | ICD-10-CM

## 2024-02-10 DIAGNOSIS — E66813 Obesity, class 3: Secondary | ICD-10-CM | POA: Diagnosis not present

## 2024-02-10 DIAGNOSIS — Z6841 Body Mass Index (BMI) 40.0 and over, adult: Secondary | ICD-10-CM

## 2024-02-10 DIAGNOSIS — E785 Hyperlipidemia, unspecified: Secondary | ICD-10-CM | POA: Diagnosis not present

## 2024-02-10 DIAGNOSIS — E1165 Type 2 diabetes mellitus with hyperglycemia: Secondary | ICD-10-CM

## 2024-02-10 LAB — POCT GLYCOSYLATED HEMOGLOBIN (HGB A1C): Hemoglobin A1C: 7.1 % — AB (ref 4.0–5.6)

## 2024-02-10 MED ORDER — FREESTYLE LIBRE 3 PLUS SENSOR MISC
1.0000 | 3 refills | Status: AC
  Filled 2024-02-10 – 2024-03-01 (×2): qty 6, 90d supply, fill #0
  Filled 2024-05-09 – 2024-05-12 (×3): qty 6, 90d supply, fill #1
  Filled 2024-08-08: qty 6, 90d supply, fill #2
  Filled ????-??-??: fill #1

## 2024-02-10 MED ORDER — TRESIBA FLEXTOUCH 200 UNIT/ML ~~LOC~~ SOPN
22.0000 [IU] | PEN_INJECTOR | Freq: Every day | SUBCUTANEOUS | 2 refills | Status: DC
  Filled 2024-02-10: qty 9, 81d supply, fill #0
  Filled 2024-05-09: qty 9, 81d supply, fill #1

## 2024-02-10 MED ORDER — BD PEN NEEDLE NANO 2ND GEN 32G X 4 MM MISC
1.0000 | Freq: Every day | 3 refills | Status: AC
  Filled 2024-02-10 (×2): qty 100, 90d supply, fill #0

## 2024-02-10 MED ORDER — DAPAGLIFLOZIN PROPANEDIOL 5 MG PO TABS
5.0000 mg | ORAL_TABLET | Freq: Every day | ORAL | 3 refills | Status: AC
  Filled 2024-02-10: qty 90, 90d supply, fill #0
  Filled 2024-05-09: qty 90, 90d supply, fill #1
  Filled 2024-08-07: qty 90, 90d supply, fill #2

## 2024-02-10 NOTE — Patient Instructions (Signed)
 Please continue: - Metformin  ER 1000 mg 2x a day with meals - Farxiga  5 mg before b'fast - Tresiba  22 units daily  Continue pravastatin  40 mg daily.  Please return in 4 months.

## 2024-02-10 NOTE — Progress Notes (Signed)
 Patient ID: Rachel Dawson, female   DOB: 1981/05/25, 43 y.o.   MRN: [id number]   HPI: Rachel Dawson is a 43 y.o.-year-old female, presenting for follow-up for DM2, dx in 2014, insulin -dependent, uncontrolled, without long term complications (but with hyperglycemia, yeast infections).  Last visit 9 months ago.  Previous visit was 1 year prior.  Interim history: No increased urination, blurry vision, nausea, chest pain.   3 weeks ago, she was on a cruise.  Sugars were higher.  Last hemoglobin A1c was: 05/27/2023: HbA1c 6.7% Lab Results  Component Value Date   HGBA1C 7.3 (A) 06/03/2022   HGBA1C 7.4 (H) 12/10/2021   HGBA1C 6.8 (H) 12/09/2020  04/30/2020: HbA1c calculated from fructosamine is 6.6% 04/14/2020: HbA1c 6.7% 09/23/2018: HbA1c calculated from fructosamine is 8.2% 06/13/2018: HbA1c from fructosamine: 7.44%  Pt is on a regimen of: - Metformin  ER 1000 mg 2x a day with meals - Farxiga  5 mg before breakfast-added 05/2022  -  >> Tresiba  28 units at bedtime >> 14 >> 11 units 2x a day >> 22 units daily She was on Victoza >> pancreatitis. She had nausea and diarrhea from regular metformin . She was on glimepiride  4 mg before dinner but ran out 08/2018 her last visit and we did not restart.   Pt checks her sugars >4x a day with her CGM :  Previously:  Prev.: - am: 109-115 >> 130s >> 89, 115-120 >> 118-156 - 2h after b'fast: n/c >> 90-143, 164 >> 125-130 >> n/c - before lunch: n/c >> her lunch >> 98-108 - 2h after lunch: n/c >> 100-139 >> n/c >> 137-163, 245 - before dinner: 110 >> 125-146 >> n/c >> 73-90s - 2h after dinner: 114-157 >> 138-140 >> n/c >> 110-120 - bedtime: n/c - nighttime: n/c Lowest sugar was 70s >> 69; it is unclear at which level she has hypoglycemia awareness. Highest sugar was 245 >> 200 >> 288.  Glucometer:  One Touch Ultra  She is seen in the [facility].  She eats a low-carb diet.  I advised her to stop juice at last visit.  She is now  only drinking occasional diet sodas.    -No CKD, last BUN/creatinine:  Lab Results  Component Value Date   BUN 18 05/27/2023   BUN 14 09/01/2022   CREATININE 0.95 05/27/2023   CREATININE 0.89 09/01/2022   No results found for: MICRALBCREAT On Irbesartan  - just increased 11/2023.  -+ HL; last set of lipids: Lab Results  Component Value Date   CHOL 192 05/27/2023   HDL 46.80 05/27/2023   LDLCALC 126 (H) 05/27/2023   LDLDIRECT 156.0 06/13/2018   TRIG 99.0 05/27/2023   CHOLHDL 4 05/27/2023  On pravastatin  40 mg daily, dose increased after the above results returned.  - last eye exam was 08/2023: No DR reportedly  - no numbness and tingling in her feet.  Latest foot exam was on 05/27/2023.  Pt has FH of DM in mother (?)  -She is adopted.  ROS: + see HPI  I reviewed pt's medications, allergies, PMH, social hx, family hx, and changes were documented in the history of present illness. Otherwise, unchanged from my initial visit note.  Past Medical History:  Diagnosis Date   Anemia    Fatty liver    History of blood transfusion    when I had tonsils taken out & w/hyster   Hypercholesterolemia    Hypertension    Menometrorrhagia    Microcytic anemia    Type II diabetes  mellitus (HCC)    Vitamin D  deficiency    Past Surgical History:  Procedure Laterality Date   ABDOMINAL HYSTERECTOMY  ~ 2012   CESAREAN SECTION  1999; 2004; 2006   HIP PINNING Bilateral ~ 1991   balls had dropped out of their sockets   TONSILLECTOMY AND ADENOIDECTOMY     TUBAL LIGATION  2006   Social History   Social History   Marital status: Single    Spouse name: N/A   Number of children: 2    Occupational History   Lab tech   Social History Main Topics   Smoking status: Former Smoker    Packs/day: 0.12 - 2 cigs a day    Years: 4.00    Types: Cigarettes    Quit date: 07/10/2014   Smokeless tobacco: Never Used   Alcohol use Yes     Comment: 11/22/2014 might drink a little a couple  times/month, if that   Drug use: No   Sexual activity: Not Currently    Birth control/ protection: Surgical   Current Outpatient Medications on File Prior to Visit  Medication Sig Dispense Refill   BD PEN NEEDLE NANO 2ND GEN 32G X 4 MM MISC USE ONE NEEDLE TWICE A DAY 200 each 3   Blood Pressure Monitoring (BLOOD PRESSURE MONITOR/L CUFF) MISC Check your blood pressure at the same time each day. 1 each 0   Continuous Blood Gluc Receiver (FREESTYLE LIBRE 2 READER) DEVI 1 each by Does not apply route daily. 1 each 0   Continuous Glucose Sensor (FREESTYLE LIBRE 2 SENSOR) MISC APPLY 1 SENSOR EVERY 14 (FOURTEEN) DAYS     Continuous Glucose Sensor (FREESTYLE LIBRE 2 SENSOR) MISC Use as directed & change every 14 days to check blood sugar. 6 each 3   dapagliflozin  propanediol (FARXIGA ) 5 MG TABS tablet Take 1 tablet (5 mg total) by mouth daily before breakfast. 30 tablet 3   fluticasone  (FLONASE ) 50 MCG/ACT nasal spray Place 1 spray into both nostrils daily. 16 g 0   glucose blood test strip Use as instructed once a day 100 each 3   hydrOXYzine  (ATARAX ) 50 MG tablet Take 1 tablet (50 mg total) by mouth 3 (three) times daily as needed for anxiety. 60 tablet 3   insulin  degludec (TRESIBA  FLEXTOUCH) 200 UNIT/ML FlexTouch Pen Inject 22 Units into the skin daily. 18 mL 2   Insulin  Pen Needle (BD PEN NEEDLE NANO 2ND GEN) 32G X 4 MM MISC USE ONE NEEDLE TWICE A DAY     irbesartan  (AVAPRO ) 150 MG tablet Take 1 tablet (150 mg total) by mouth daily. 90 tablet 1   metFORMIN  (GLUCOPHAGE -XR) 500 MG 24 hr tablet Take 2 tablets (1,000 mg total) by mouth 2 (two) times daily. Take with food 360 tablet 1   ondansetron  (ZOFRAN ) 4 MG tablet Take 1 tablet (4 mg total) by mouth every 8 (eight) hours as needed for nausea or vomiting. 12 tablet 0   pravastatin  (PRAVACHOL ) 40 MG tablet Take 1 tablet (40 mg total) by mouth daily. 90 tablet 3   Rimegepant Sulfate  (NURTEC) 75 MG TBDP Take 1 tablet (75 mg total) by mouth as needed.  8 tablet 6   scopolamine  (TRANSDERM-SCOP) 1 MG/3DAYS Place 1 patch (1.5 mg total) onto the skin every 3 (three) days. 2 patch 0   spironolactone  (ALDACTONE ) 50 MG tablet Take 1 tablet (50 mg total) by mouth daily. 90 tablet 3   Vitamin D , Ergocalciferol , (DRISDOL ) 1.25 MG (50000 UNIT) CAPS capsule Take  1 capsule (50,000 Units total) by mouth every 7 (seven) days. 12 capsule 0   [DISCONTINUED] ferrous fumarate (HEMOCYTE - 106 MG FE) 325 (106 FE) MG TABS Take 1 tablet by mouth daily.       [DISCONTINUED] medroxyPROGESTERone (PROVERA) 10 MG tablet Take 10 mg by mouth daily.       No current facility-administered medications on file prior to visit.   Allergies  Allergen Reactions   Latex Rash   Family History  Adopted: Yes  Problem Relation Age of Onset   Hypertension Daughter    PE: BP (!) 146/80   Pulse 68   Ht 5' 5.5 (1.664 m)   Wt 300 lb (136.1 kg)   LMP 01/05/2011 (Exact Date)   SpO2 99%   BMI 49.16 kg/m  Wt Readings from Last 10 Encounters:  02/10/24 300 lb (136.1 kg)  06/16/23 (!) 300 lb 12.8 oz (136.4 kg)  05/27/23 (!) 301 lb 3.2 oz (136.6 kg)  03/18/23 (!) 307 lb 12.8 oz (139.6 kg)  12/10/22 (!) 306 lb (138.8 kg)  10/05/22 (!) 311 lb 9.6 oz (141.3 kg)  09/17/22 (!) 313 lb 3.2 oz (142.1 kg)  09/09/22 (!) 309 lb 8 oz (140.4 kg)  09/03/22 (!) 308 lb 12.8 oz (140.1 kg)  09/01/22 (!) 314 lb (142.4 kg)   Constitutional: overweight, in NAD Eyes:  EOMI, no exophthalmos ENT: no neck masses, no cervical lymphadenopathy Cardiovascular: RRR, No MRG Respiratory: CTA B Musculoskeletal: no deformities Skin:no rashes Neurological: no tremor with outstretched hands Diabetic Foot Exam - Simple   Simple Foot Form Diabetic Foot exam was performed with the following findings: Yes 02/10/2024  1:53 PM  Visual Inspection No deformities, no ulcerations, no other skin breakdown bilaterally: Yes Sensation Testing Intact to touch and monofilament testing bilaterally: Yes Pulse  Check Posterior Tibialis and Dorsalis pulse intact bilaterally: Yes Comments    ASSESSMENT: 1. DM2, insulin -dependent, uncontrolled, without long term complications, but with hyperglycemia  2. Obesity class 3  3. HL  PLAN:  1. Patient with history of uncontrolled type 2 diabetes, on oral antidiabetic regimen with metformin  and SGLT2 inhibitor and also long-acting insulin , returning after another long absence.  Last visit was 9 months ago, previous visit was a year prior.  At last visit, sugars were fluctuating within the target range with only occasional hyperglycemic spikes but she had occasional blood sugars in the upper 60s in the morning especially if sleeping more during the weekend.  We discussed about the fact that it is continued, we had to reduce the dose of her long-acting insulin .  We did not change the regimen otherwise. CGM interpretation: -At today's visit, we reviewed her CGM downloads: It appears that 91% of values are in target range (goal >70%), while 9% are higher than 180 (goal <25%), and 0% are lower than 70 (goal <4%).  The calculated average blood sugar is 138.  The projected HbA1c for the next 3 months (GMI) is 6.4-6.5%. -Reviewing the CGM trends, sugars appear to be slightly more fluctuating than before, with occasional higher blood sugars, up to the 200s.  She mentions that her sugars increased while on the cruise 3 weeks ago but they are improving.  The HbA1c predicted from her CGM is actually better than the HbA1c obtained today, and it is at goal, so for now, we discussed about continuing the same regimen.  I refilled her prescriptions. - I suggested to:  Patient Instructions  Please continue: - Metformin  ER 1000 mg 2x a  day with meals - Farxiga  5 mg before b'fast - Tresiba  22 units daily  Continue pravastatin  40 mg daily.  Please return in 4 months.  - we checked her HbA1c: 7.1% (higher) - advised to check sugars at different times of the day - 4x a day,  rotating check times - advised for yearly eye exams >> she is UTD - will check an ACR today - return to clinic in 4 months  2. Obesity class 3 -Unfortunately, we cannot use a GLP-1 receptor agonist for her as she developed pancreatitis while on Victoza.   -She previously had increased urination on chlorthalidone  and also perineal infection, so we did not start SGLT2 inhibitors daily.  However, she is currently on Farxiga  with good tolerance. - She lost 13 pounds before last visit, previously lost 8 - There are no significant changes in her weight since last visit  3. HL - Latest lipid panel is from last visit and it showed an elevated LDL: Lab Results  Component Value Date   CHOL 192 05/27/2023   HDL 46.80 05/27/2023   LDLCALC 126 (H) 05/27/2023   LDLDIRECT 156.0 06/13/2018   TRIG 99.0 05/27/2023   CHOLHDL 4 05/27/2023  - She continues on pravastatin  40 once daily.   Lela Fendt, MD PhD [facility] Endocrinology

## 2024-02-11 ENCOUNTER — Ambulatory Visit: Payer: Self-pay | Admitting: Internal Medicine

## 2024-02-11 LAB — MICROALBUMIN / CREATININE URINE RATIO
Creatinine, Urine: 112 mg/dL (ref 20–275)
Microalb Creat Ratio: 27 mg/g{creat} (ref <30)
Microalb, Ur: 3 mg/dL

## 2024-02-14 ENCOUNTER — Other Ambulatory Visit (HOSPITAL_BASED_OUTPATIENT_CLINIC_OR_DEPARTMENT_OTHER): Payer: Self-pay

## 2024-02-14 ENCOUNTER — Other Ambulatory Visit: Payer: Self-pay | Admitting: Internal Medicine

## 2024-02-14 MED ORDER — FLUCONAZOLE 150 MG PO TABS
150.0000 mg | ORAL_TABLET | Freq: Once | ORAL | 1 refills | Status: AC
  Filled 2024-02-14: qty 1, 1d supply, fill #0

## 2024-02-22 DIAGNOSIS — G4733 Obstructive sleep apnea (adult) (pediatric): Secondary | ICD-10-CM | POA: Diagnosis not present

## 2024-02-24 ENCOUNTER — Ambulatory Visit: Admitting: Internal Medicine

## 2024-03-01 ENCOUNTER — Other Ambulatory Visit (HOSPITAL_BASED_OUTPATIENT_CLINIC_OR_DEPARTMENT_OTHER): Payer: Self-pay

## 2024-03-08 ENCOUNTER — Other Ambulatory Visit: Payer: Self-pay

## 2024-03-21 ENCOUNTER — Other Ambulatory Visit (HOSPITAL_BASED_OUTPATIENT_CLINIC_OR_DEPARTMENT_OTHER): Payer: Self-pay

## 2024-03-21 MED ORDER — PREDNISONE 10 MG (21) PO TBPK
ORAL_TABLET | ORAL | 1 refills | Status: AC
  Filled 2024-03-21: qty 21, 6d supply, fill #0

## 2024-04-05 ENCOUNTER — Ambulatory Visit: Admitting: Family Medicine

## 2024-04-05 ENCOUNTER — Other Ambulatory Visit (HOSPITAL_BASED_OUTPATIENT_CLINIC_OR_DEPARTMENT_OTHER): Payer: Self-pay

## 2024-04-05 VITALS — BP 136/80 | HR 65 | Temp 98.8°F | Ht 66.5 in | Wt 301.0 lb

## 2024-04-05 DIAGNOSIS — E1122 Type 2 diabetes mellitus with diabetic chronic kidney disease: Secondary | ICD-10-CM | POA: Diagnosis not present

## 2024-04-05 DIAGNOSIS — Z794 Long term (current) use of insulin: Secondary | ICD-10-CM

## 2024-04-05 DIAGNOSIS — N182 Chronic kidney disease, stage 2 (mild): Secondary | ICD-10-CM | POA: Diagnosis not present

## 2024-04-05 DIAGNOSIS — Z0001 Encounter for general adult medical examination with abnormal findings: Secondary | ICD-10-CM

## 2024-04-05 DIAGNOSIS — Z0000 Encounter for general adult medical examination without abnormal findings: Secondary | ICD-10-CM

## 2024-04-05 DIAGNOSIS — Z1211 Encounter for screening for malignant neoplasm of colon: Secondary | ICD-10-CM

## 2024-04-05 DIAGNOSIS — E538 Deficiency of other specified B group vitamins: Secondary | ICD-10-CM

## 2024-04-05 DIAGNOSIS — N760 Acute vaginitis: Secondary | ICD-10-CM

## 2024-04-05 DIAGNOSIS — I10 Essential (primary) hypertension: Secondary | ICD-10-CM

## 2024-04-05 DIAGNOSIS — F419 Anxiety disorder, unspecified: Secondary | ICD-10-CM

## 2024-04-05 DIAGNOSIS — E559 Vitamin D deficiency, unspecified: Secondary | ICD-10-CM

## 2024-04-05 LAB — POCT URINALYSIS DIPSTICK
Bilirubin, UA: NEGATIVE
Blood, UA: NEGATIVE
Glucose, UA: POSITIVE — AB
Ketones, UA: NEGATIVE
Leukocytes, UA: NEGATIVE
Nitrite, UA: NEGATIVE
Protein, UA: NEGATIVE
Spec Grav, UA: >=1.03 — AB (ref 1.010–1.025)
Urobilinogen, UA: 0.2 U/dL
pH, UA: 6 (ref 5.0–8.0)

## 2024-04-05 MED ORDER — FLUCONAZOLE 150 MG PO TABS
ORAL_TABLET | ORAL | 0 refills | Status: AC
  Filled 2024-04-05: qty 2, 3d supply, fill #0

## 2024-04-05 NOTE — Progress Notes (Signed)
 Established Patient Office Visit   Subjective  Patient ID: Rachel Dawson, female    DOB: [birth year]  Age: 43 y.o. MRN: [id number]  Chief Complaint  Patient presents with   Annual Exam    Pt is a 43 yo female seen for CPE.   States bs has been improving.  Followed by Endo.  Does fell like developing a yeast infection from the farxiga  and recent change in diet while on a cruise.  Bp has also been better.  In the process of having dental work done.  Was given rx for prednisone  but did not take it since it may raise bs.    Patient Active Problem List   Diagnosis Date Noted   Vitamin D  deficiency 06/17/2023   Vitamin B12 deficiency 06/17/2023   Mild obstructive sleep apnea 12/10/2022   Snoring 09/17/2022   Breast lesion 07/24/2022   Herpes simplex 05/25/2022   Pain of left breast 02/19/2022   Type 2 diabetes mellitus with stage 2 chronic kidney disease, with long-term current use of insulin  (HCC) 04/18/2020   Perirectal abscess 04/14/2020   Hyperlipidemia 09/23/2018   Anxiety and depression 02/08/2018   Diabetes mellitus (HCC) 10/07/2017   Acute mastitis 11/23/2014   Trichomonas vaginalis infection 11/23/2014   Morbid obesity with BMI of 45.0-49.9, adult (HCC) 11/23/2014   Steatohepatitis, non-alcoholic 11/23/2014   Hypertension, essential, benign 11/23/2014   Transaminitis 11/22/2014   Type 2 diabetes mellitus with hyperglycemia, with long-term current use of insulin  (HCC) 11/22/2014   Moderate dehydration 11/22/2014   Nausea vomiting and diarrhea 11/22/2014   Microcytic anemia    Uterine fibroid    Hypertension    Past Medical History:  Diagnosis Date   Anemia    Fatty liver    History of blood transfusion    when I had tonsils taken out & w/hyster   Hypercholesterolemia    Hypertension    Menometrorrhagia    Microcytic anemia    Type II diabetes mellitus (HCC)    Vitamin D  deficiency    Past Surgical History:  Procedure Laterality Date   ABDOMINAL  HYSTERECTOMY  ~ 2012   CESAREAN SECTION  1999; 2004; 2006   HIP PINNING Bilateral ~ 1991   balls had dropped out of their sockets   TONSILLECTOMY AND ADENOIDECTOMY     TUBAL LIGATION  2006   Social History   Tobacco Use   Smoking status: Former    Current packs/day: 0.00    Average packs/day: 0.1 packs/day for 4.0 years (0.5 ttl pk-yrs)    Types: Cigarettes    Start date: 07/10/2010    Quit date: 07/10/2014    Years since quitting: 9.7   Smokeless tobacco: Never  Vaping Use   Vaping status: Never Used  Substance Use Topics   Alcohol use: Yes    Comment: occ   Drug use: No   Family History  Adopted: Yes  Problem Relation Age of Onset   Hypertension Daughter    Allergies  Allergen Reactions   Latex Rash    ROS Negative unless stated above    Objective:     BP 136/80   Pulse 65   Temp 98.8 F (37.1 C) (Oral)   Ht 5' 6.5 (1.689 m)   Wt (!) 301 lb (136.5 kg)   LMP 01/05/2011 (Exact Date)   SpO2 98%   BMI 47.85 kg/m  BP Readings from Last 3 Encounters:  04/05/24 136/80  02/10/24 (!) 146/80  06/16/23 (!) 162/96   Wt Readings  from Last 3 Encounters:  04/05/24 (!) 301 lb (136.5 kg)  02/10/24 300 lb (136.1 kg)  06/16/23 (!) 300 lb 12.8 oz (136.4 kg)      Physical Exam Constitutional:      Appearance: Normal appearance.  HENT:     Head: Normocephalic and atraumatic.     Right Ear: Tympanic membrane, ear canal and external ear normal.     Left Ear: Tympanic membrane, ear canal and external ear normal.     Nose: Nose normal.     Mouth/Throat:     Mouth: Mucous membranes are moist.     Pharynx: No oropharyngeal exudate or posterior oropharyngeal erythema.  Eyes:     General: No scleral icterus.    Extraocular Movements: Extraocular movements intact.     Conjunctiva/sclera: Conjunctivae normal.     Pupils: Pupils are equal, round, and reactive to light.  Neck:     Thyroid : No thyromegaly.     Vascular: No carotid bruit.  Cardiovascular:     Rate  and Rhythm: Normal rate and regular rhythm.     Pulses: Normal pulses.     Heart sounds: Normal heart sounds. No murmur heard.    No friction rub.  Pulmonary:     Effort: Pulmonary effort is normal.     Breath sounds: Normal breath sounds. No wheezing, rhonchi or rales.  Abdominal:     General: Bowel sounds are normal.     Palpations: Abdomen is soft.     Tenderness: There is no abdominal tenderness.  Musculoskeletal:        General: No deformity. Normal range of motion.  Lymphadenopathy:     Cervical: No cervical adenopathy.  Skin:    General: Skin is warm and dry.     Findings: No lesion.  Neurological:     General: No focal deficit present.     Mental Status: She is alert and oriented to person, place, and time.  Psychiatric:        Mood and Affect: Mood normal.        Thought Content: Thought content normal.        04/05/2024    3:55 PM 12/27/2023    3:24 PM 03/18/2023    3:05 PM  Depression screen PHQ 2/9  Decreased Interest 0 0 0  Down, Depressed, Hopeless 0 0 0  PHQ - 2 Score 0 0 0  Altered sleeping 0  0  Tired, decreased energy 0  0  Change in appetite 0  0  Feeling bad or failure about yourself  0  0  Trouble concentrating 0  0  Moving slowly or fidgety/restless 0  0  Suicidal thoughts 0  0  PHQ-9 Score 0  0      12/27/2023    3:24 PM 03/18/2023    3:06 PM 09/03/2022    9:13 AM 12/09/2020    9:20 AM  GAD 7 : Generalized Anxiety Score  Nervous, Anxious, on Edge 0 0 1 0  Control/stop worrying 0 0 0 0  Worry too much - different things 0 0 0 0  Trouble relaxing 0 0 1 0  Restless 0 0 0 0  Easily annoyed or irritable 0 0 0 1  Afraid - awful might happen 0 0 0 0  Total GAD 7 Score 0 0 2 1  Anxiety Difficulty   Not difficult at all      No results found for any visits on 04/05/24.    Assessment & Plan:  Well adult exam -     CBC with Differential/Platelet; Future -     Comprehensive metabolic panel with GFR; Future -     Hemoglobin A1c; Future -      Lipid panel; Future -     T4, free; Future -     TSH; Future  Type 2 diabetes mellitus with stage 2 chronic kidney disease, with long-term current use of insulin  (HCC) -     Hemoglobin A1c; Future  Essential hypertension -     Comprehensive metabolic panel with GFR; Future -     T4, free; Future -     TSH; Future  Vitamin B12 deficiency -     Vitamin B12; Future  Vitamin D  deficiency -     VITAMIN D  25 Hydroxy (Vit-D Deficiency, Fractures); Future  Morbid obesity (HCC) -     Comprehensive metabolic panel with GFR; Future -     Hemoglobin A1c; Future -     T4, free; Future -     TSH; Future -     Vitamin B12; Future -     VITAMIN D  25 Hydroxy (Vit-D Deficiency, Fractures); Future  Acute vaginitis -     Fluconazole ; Take 1 tablet by mouth now.  Repeat dose in 3 days if needed.  Dispense: 2 tablet; Refill: 0 -     POCT urinalysis dipstick  Colon cancer screening -     Ambulatory referral to Gastroenterology  Age appropriate health screenings discussed.  Obtain labs. Immunizations reviewed.  Consider influenza when available.  Mammogram to be scheduled.  Eye exam coming up in Nov.  Pap done 03/17/23.  Colonoscopy ordered.  BP improved.  Continue lifestyle modifications and current meds.  Diflucan  sent for vaginitis, likely 2/2 yeast given farxiga  use.  Continue f/u with endo.  Return in about 5 months (around 09/05/2024).   Clotilda JONELLE Single, MD

## 2024-04-06 LAB — VITAMIN B12: Vitamin B-12: 149 pg/mL — ABNORMAL LOW (ref 211–911)

## 2024-04-06 LAB — CBC WITH DIFFERENTIAL/PLATELET
Basophils Absolute: 0.1 K/uL (ref 0.0–0.1)
Basophils Relative: 0.9 % (ref 0.0–3.0)
Eosinophils Absolute: 0.1 K/uL (ref 0.0–0.7)
Eosinophils Relative: 1.4 % (ref 0.0–5.0)
HCT: 40.1 % (ref 36.0–46.0)
Hemoglobin: 12.8 g/dL (ref 12.0–15.0)
Lymphocytes Relative: 25.9 % (ref 12.0–46.0)
Lymphs Abs: 2.2 K/uL (ref 0.7–4.0)
MCHC: 32 g/dL (ref 30.0–36.0)
MCV: 81.5 fl (ref 78.0–100.0)
Monocytes Absolute: 0.5 K/uL (ref 0.1–1.0)
Monocytes Relative: 5.8 % (ref 3.0–12.0)
Neutro Abs: 5.5 K/uL (ref 1.4–7.7)
Neutrophils Relative %: 66 % (ref 43.0–77.0)
Platelets: 332 K/uL (ref 150.0–400.0)
RBC: 4.92 Mil/uL (ref 3.87–5.11)
RDW: 14.7 % (ref 11.5–15.5)
WBC: 8.4 K/uL (ref 4.0–10.5)

## 2024-04-06 LAB — COMPREHENSIVE METABOLIC PANEL WITH GFR
ALT: 13 U/L (ref 0–35)
AST: 14 U/L (ref 0–37)
Albumin: 4.5 g/dL (ref 3.5–5.2)
Alkaline Phosphatase: 68 U/L (ref 39–117)
BUN: 17 mg/dL (ref 6–23)
CO2: 22 meq/L (ref 19–32)
Calcium: 9.4 mg/dL (ref 8.4–10.5)
Chloride: 104 meq/L (ref 96–112)
Creatinine, Ser: 1.08 mg/dL (ref 0.40–1.20)
GFR: 62.15 mL/min (ref >60.00)
Glucose, Bld: 102 mg/dL — ABNORMAL HIGH (ref 70–99)
Potassium: 3.9 meq/L (ref 3.5–5.1)
Sodium: 138 meq/L (ref 135–145)
Total Bilirubin: 0.5 mg/dL (ref 0.2–1.2)
Total Protein: 7.9 g/dL (ref 6.0–8.3)

## 2024-04-06 LAB — LIPID PANEL
Cholesterol: 173 mg/dL (ref 0–200)
HDL: 51 mg/dL (ref >39.00)
LDL Cholesterol: 104 mg/dL — ABNORMAL HIGH (ref 0–99)
NonHDL: 122.02
Total CHOL/HDL Ratio: 3
Triglycerides: 89 mg/dL (ref 0.0–149.0)
VLDL: 17.8 mg/dL (ref 0.0–40.0)

## 2024-04-06 LAB — TSH: TSH: 1.31 u[IU]/mL (ref 0.35–5.50)

## 2024-04-06 LAB — T4, FREE: Free T4: 0.84 ng/dL (ref 0.60–1.60)

## 2024-04-06 LAB — HEMOGLOBIN A1C: Hgb A1c MFr Bld: 7.4 % — ABNORMAL HIGH (ref 4.6–6.5)

## 2024-04-06 LAB — VITAMIN D 25 HYDROXY (VIT D DEFICIENCY, FRACTURES): VITD: 27.5 ng/mL — ABNORMAL LOW (ref 30.00–100.00)

## 2024-04-13 ENCOUNTER — Ambulatory Visit: Payer: Self-pay | Admitting: Family Medicine

## 2024-04-13 ENCOUNTER — Other Ambulatory Visit (HOSPITAL_BASED_OUTPATIENT_CLINIC_OR_DEPARTMENT_OTHER): Payer: Self-pay

## 2024-04-13 DIAGNOSIS — E559 Vitamin D deficiency, unspecified: Secondary | ICD-10-CM

## 2024-04-13 MED ORDER — VITAMIN D (ERGOCALCIFEROL) 1.25 MG (50000 UNIT) PO CAPS
50000.0000 [IU] | ORAL_CAPSULE | ORAL | 0 refills | Status: AC
Start: 2024-04-13 — End: ?
  Filled 2024-04-13: qty 12, 84d supply, fill #0

## 2024-05-03 DIAGNOSIS — Z1211 Encounter for screening for malignant neoplasm of colon: Secondary | ICD-10-CM | POA: Diagnosis not present

## 2024-05-03 DIAGNOSIS — Z01411 Encounter for gynecological examination (general) (routine) with abnormal findings: Secondary | ICD-10-CM | POA: Diagnosis not present

## 2024-05-03 DIAGNOSIS — Z01419 Encounter for gynecological examination (general) (routine) without abnormal findings: Secondary | ICD-10-CM | POA: Diagnosis not present

## 2024-05-03 DIAGNOSIS — N644 Mastodynia: Secondary | ICD-10-CM | POA: Diagnosis not present

## 2024-05-03 DIAGNOSIS — Z1231 Encounter for screening mammogram for malignant neoplasm of breast: Secondary | ICD-10-CM | POA: Diagnosis not present

## 2024-05-04 ENCOUNTER — Other Ambulatory Visit: Payer: Self-pay | Admitting: Family Medicine

## 2024-05-04 DIAGNOSIS — N760 Acute vaginitis: Secondary | ICD-10-CM

## 2024-05-09 ENCOUNTER — Encounter (HOSPITAL_BASED_OUTPATIENT_CLINIC_OR_DEPARTMENT_OTHER): Payer: Self-pay

## 2024-05-09 ENCOUNTER — Other Ambulatory Visit (HOSPITAL_BASED_OUTPATIENT_CLINIC_OR_DEPARTMENT_OTHER): Payer: Self-pay

## 2024-05-09 ENCOUNTER — Other Ambulatory Visit: Payer: Self-pay

## 2024-05-10 ENCOUNTER — Other Ambulatory Visit (HOSPITAL_BASED_OUTPATIENT_CLINIC_OR_DEPARTMENT_OTHER): Payer: Self-pay

## 2024-05-12 ENCOUNTER — Other Ambulatory Visit (HOSPITAL_BASED_OUTPATIENT_CLINIC_OR_DEPARTMENT_OTHER): Payer: Self-pay

## 2024-06-15 ENCOUNTER — Other Ambulatory Visit (HOSPITAL_BASED_OUTPATIENT_CLINIC_OR_DEPARTMENT_OTHER): Payer: Self-pay

## 2024-06-15 ENCOUNTER — Ambulatory Visit: Admitting: Internal Medicine

## 2024-06-15 ENCOUNTER — Encounter: Payer: Self-pay | Admitting: Internal Medicine

## 2024-06-15 VITALS — BP 130/70 | HR 76 | Ht 66.5 in | Wt 302.6 lb

## 2024-06-15 DIAGNOSIS — Z794 Long term (current) use of insulin: Secondary | ICD-10-CM

## 2024-06-15 DIAGNOSIS — Z6841 Body Mass Index (BMI) 40.0 and over, adult: Secondary | ICD-10-CM

## 2024-06-15 DIAGNOSIS — E66813 Obesity, class 3: Secondary | ICD-10-CM | POA: Diagnosis not present

## 2024-06-15 DIAGNOSIS — E785 Hyperlipidemia, unspecified: Secondary | ICD-10-CM

## 2024-06-15 DIAGNOSIS — E1165 Type 2 diabetes mellitus with hyperglycemia: Secondary | ICD-10-CM | POA: Diagnosis not present

## 2024-06-15 LAB — POCT GLYCOSYLATED HEMOGLOBIN (HGB A1C): Hemoglobin A1C: 6.8 % — AB (ref 4.0–5.6)

## 2024-06-15 MED ORDER — METFORMIN HCL ER 500 MG PO TB24
1000.0000 mg | ORAL_TABLET | Freq: Two times a day (BID) | ORAL | 3 refills | Status: AC
  Filled 2024-06-15: qty 360, 90d supply, fill #0

## 2024-06-15 MED ORDER — TRESIBA FLEXTOUCH 200 UNIT/ML ~~LOC~~ SOPN: 18.0000 [IU] | PEN_INJECTOR | Freq: Every day | SUBCUTANEOUS | Status: DC

## 2024-06-15 MED ORDER — TIRZEPATIDE 2.5 MG/0.5ML ~~LOC~~ SOAJ
2.5000 mg | SUBCUTANEOUS | 1 refills | Status: AC
  Filled 2024-06-15: qty 2, 28d supply, fill #0
  Filled 2024-08-07: qty 2, 28d supply, fill #1

## 2024-06-15 NOTE — Patient Instructions (Addendum)
 Please continue: - Metformin  ER 1000 mg 2x a day with meals - Farxiga  5 mg before b'fast  Please decrease: - Tresiba  18 units daily  Try to start: - Mounjaro 2.5 mg weekly  Please return in 4 months.

## 2024-06-15 NOTE — Progress Notes (Signed)
 Patient ID: Rachel Dawson, female   DOB: 1981-04-09, 43 y.o.   MRN: [id number]   HPI: Rachel Dawson is a 42 y.o.-year-old female, presenting for follow-up for DM2, dx in 2014, insulin -dependent, uncontrolled, without long term complications (but with hyperglycemia, yeast infections).  Last visit 3 months ago.  Interim history: No increased urination, blurry vision, nausea, chest pain.   She is frustrated about inability to lose weight.  Last hemoglobin A1c was: Lab Results  Component Value Date   HGBA1C 7.4 (H) 04/05/2024   HGBA1C 7.1 (A) 02/10/2024   HGBA1C 7.3 (A) 06/03/2022  05/27/2023: HbA1c 6.7% 04/30/2020: HbA1c calculated from fructosamine is 6.6% 04/14/2020: HbA1c 6.7% 09/23/2018: HbA1c calculated from fructosamine is 8.2% 06/13/2018: HbA1c from fructosamine: 7.44%  Pt is on a regimen of: - Metformin  ER 1000 mg 2x a day with meals - Farxiga  5 mg before breakfast-added 05/2022  - Tresiba  28 units at bedtime >> 14 >> 11 units 2x a day >> 22 units daily She was on Victoza >> pancreatitis - ~2016 She had nausea and diarrhea from regular metformin . She was on glimepiride  4 mg before dinner but ran out 08/2018 her last visit and we did not restart.  She was previously on Basaglar .  Pt checks her sugars >4x a day with her CGM :  Previously:  Previously:   Lowest sugar was 70s >> 69; it is unclear at which level she has hypoglycemia awareness. Highest sugar was 245 >> 200 >> 288 >> 200  Glucometer:  One Touch Ultra  She is seen in the [facility].  She eats a low-carb diet.  I advised her to stop juice at last visit.  She is now only drinking occasional diet sodas.    -No CKD, last BUN/creatinine:  Lab Results  Component Value Date   BUN 17 04/05/2024   BUN 18 05/27/2023   CREATININE 1.08 04/05/2024   CREATININE 0.95 05/27/2023   Lab Results  Component Value Date   MICRALBCREAT 27 02/10/2024   On Irbesartan  - just increased 11/2023.  -+ HL; last  set of lipids: Lab Results  Component Value Date   CHOL 173 04/05/2024   HDL 51.00 04/05/2024   LDLCALC 104 (H) 04/05/2024   LDLDIRECT 156.0 06/13/2018   TRIG 89.0 04/05/2024   CHOLHDL 3 04/05/2024  On pravastatin  40 mg daily, tolerated well.  - last eye exam was 08/2023: No DR reportedly  - no numbness and tingling in her feet.  Latest foot exam was on 02/10/2024.  Pt has FH of DM in mother (?)  -She is adopted.  ROS: + see HPI  I reviewed pt's medications, allergies, PMH, social hx, family hx, and changes were documented in the history of present illness. Otherwise, unchanged from my initial visit note.  Past Medical History:  Diagnosis Date   Anemia    Fatty liver    History of blood transfusion    when I had tonsils taken out & w/hyster   Hypercholesterolemia    Hypertension    Menometrorrhagia    Microcytic anemia    Type II diabetes mellitus (HCC)    Vitamin D  deficiency    Past Surgical History:  Procedure Laterality Date   ABDOMINAL HYSTERECTOMY  ~ 2012   CESAREAN SECTION  1999; 2004; 2006   HIP PINNING Bilateral ~ 1991   balls had dropped out of their sockets   TONSILLECTOMY AND ADENOIDECTOMY     TUBAL LIGATION  2006   Social History   Social  History   Marital status: Single    Spouse name: N/A   Number of children: 2    Occupational History   Lab tech   Social History Main Topics   Smoking status: Former Smoker    Packs/day: 0.12 - 2 cigs a day    Years: 4.00    Types: Cigarettes    Quit date: 07/10/2014   Smokeless tobacco: Never Used   Alcohol use Yes     Comment: 11/22/2014 might drink a little a couple times/month, if that   Drug use: No   Sexual activity: Not Currently    Birth control/ protection: Surgical   Current Outpatient Medications on File Prior to Visit  Medication Sig Dispense Refill   Blood Pressure Monitoring (BLOOD PRESSURE MONITOR/L CUFF) MISC Check your blood pressure at the same time each day. 1 each 0   Continuous  Blood Gluc Receiver (FREESTYLE LIBRE 2 READER) DEVI 1 each by Does not apply route daily. 1 each 0   Continuous Glucose Sensor (FREESTYLE LIBRE 3 PLUS SENSOR) MISC Use as directed and change every 15 (fifteen) days. 6 each 3   dapagliflozin  propanediol (FARXIGA ) 5 MG TABS tablet Take 1 tablet (5 mg total) by mouth daily before breakfast. 90 tablet 3   fluconazole  (DIFLUCAN ) 150 MG tablet Take 1 tablet by mouth now.  Repeat dose in 3 days if needed. 2 tablet 0   fluticasone  (FLONASE ) 50 MCG/ACT nasal spray Place 1 spray into both nostrils daily. 16 g 0   glucose blood test strip Use as instructed once a day 100 each 3   hydrOXYzine  (ATARAX ) 50 MG tablet Take 1 tablet (50 mg total) by mouth 3 (three) times daily as needed for anxiety. 60 tablet 3   insulin  degludec (TRESIBA  FLEXTOUCH) 200 UNIT/ML FlexTouch Pen Inject 22 Units into the skin daily. 18 mL 2   Insulin  Pen Needle (BD PEN NEEDLE NANO 2ND GEN) 32G X 4 MM MISC Use 1 daily as directed. 100 each 3   irbesartan  (AVAPRO ) 150 MG tablet Take 1 tablet (150 mg total) by mouth daily. 90 tablet 1   metFORMIN  (GLUCOPHAGE -XR) 500 MG 24 hr tablet Take 2 tablets (1,000 mg total) by mouth 2 (two) times daily. Take with food 360 tablet 1   ondansetron  (ZOFRAN ) 4 MG tablet Take 1 tablet (4 mg total) by mouth every 8 (eight) hours as needed for nausea or vomiting. 12 tablet 0   pravastatin  (PRAVACHOL ) 40 MG tablet Take 1 tablet (40 mg total) by mouth daily. 90 tablet 3   predniSONE  (STERAPRED UNI-PAK 21 TAB) 10 MG (21) TBPK tablet Take as directed on pack for 6 days. 21 each 1   Rimegepant Sulfate  (NURTEC) 75 MG TBDP Take 1 tablet (75 mg total) by mouth as needed. 8 tablet 6   scopolamine  (TRANSDERM-SCOP) 1 MG/3DAYS Place 1 patch (1.5 mg total) onto the skin every 3 (three) days. 2 patch 0   spironolactone  (ALDACTONE ) 50 MG tablet Take 1 tablet (50 mg total) by mouth daily. 90 tablet 3   Vitamin D , Ergocalciferol , (DRISDOL ) 1.25 MG (50000 UNIT) CAPS capsule  Take 1 capsule (50,000 Units total) by mouth every 7 (seven) days. 12 capsule 0   [DISCONTINUED] ferrous fumarate (HEMOCYTE - 106 MG FE) 325 (106 FE) MG TABS Take 1 tablet by mouth daily.       [DISCONTINUED] medroxyPROGESTERone (PROVERA) 10 MG tablet Take 10 mg by mouth daily.       No current facility-administered medications on file  prior to visit.   Allergies  Allergen Reactions   Latex Rash   Family History  Adopted: Yes  Problem Relation Age of Onset   Hypertension Daughter    PE: BP 130/70   Pulse 76   Ht 5' 6.5 (1.689 m)   Wt (!) 302 lb 9.6 oz (137.3 kg)   LMP 01/05/2011 (Exact Date)   SpO2 99%   BMI 48.11 kg/m  Wt Readings from Last 10 Encounters:  06/15/24 (!) 302 lb 9.6 oz (137.3 kg)  04/05/24 (!) 301 lb (136.5 kg)  02/10/24 300 lb (136.1 kg)  06/16/23 (!) 300 lb 12.8 oz (136.4 kg)  05/27/23 (!) 301 lb 3.2 oz (136.6 kg)  03/18/23 (!) 307 lb 12.8 oz (139.6 kg)  12/10/22 (!) 306 lb (138.8 kg)  10/05/22 (!) 311 lb 9.6 oz (141.3 kg)  09/17/22 (!) 313 lb 3.2 oz (142.1 kg)  09/09/22 (!) 309 lb 8 oz (140.4 kg)   Constitutional: overweight, in NAD Eyes:  EOMI, no exophthalmos ENT: no neck masses, no cervical lymphadenopathy Cardiovascular: RRR, No MRG Respiratory: CTA B Musculoskeletal: no deformities Skin:no rashes Neurological: no tremor with outstretched hands  ASSESSMENT: 1. DM2, insulin -dependent, uncontrolled, without long term complications, but with hyperglycemia  2. Obesity class 3  3. HL  PLAN:  1. Patient with history of uncontrolled type 2 diabetes, on oral antidiabetic regimen with metformin  and SGLT2 inhibitor along with long-acting insulin , who returns at last visit after another long absence of 9 months.  Sugars were more fluctuating than before, possibly due to the fact that she just returned from a cruise.  HbA1c slightly higher, but the HbA1c protected by her sensor for the previous 2 weeks was much better, at goal, so we discussed about  continuing the same regimen.  I refilled her prescriptions. CGM interpretation: -At today's visit, we reviewed her CGM downloads: It appears that 95% of values are in target range (goal >70%), while 2% are higher than 180 (goal <25%), and 3% are lower than 70 (goal <4%).  The calculated average blood sugar is 117.  The projected HbA1c for the next 3 months (GMI) is 6.1%. -Reviewing the CGM trends, sugars appear to be fluctuating in the normal range and dropping on the target throughout the day.  At today's visit we will go ahead and reduce her long-acting insulin  dose.  She is frustrated about inability to lose weight.  She inquires about weight loss medications.  She is already on Farxiga  which should help, but we reviewed her history of pancreatitis in 2016, while on Victoza.  Her diabetes is uncontrolled at that time, she mentions that she also feels that she has a better grip on her diet right now.  We discussed that a distant history of pancreatitis is not an absolute contraindication for GLP-1 receptor agonist anymore and decided to maybe give this class of medications another try.  I did advise her to start at a very low dose, and to try to increase the dose very slowly.  I also advised her to stop the medication immediately if she developed nausea, vomiting, abdominal pain.  She agrees with this.  As of now, we will send Mounjaro to her pharmacy, since this is a little bit better tolerated than Ozempic.  However, if this is not covered, we can microdose Ozempic, started at 1.25 mg weekly and increasing slowly from there.  I discussed with her that we probably will need to decrease the Tresiba  dose even more and I am  hoping that maybe she can even come off if she can tolerate Mounjaro well. - I suggested to:  Patient Instructions  Please continue: - Metformin  ER 1000 mg 2x a day with meals - Farxiga  5 mg before b'fast  Please decrease: - Tresiba  18 units daily  Try to start: - Mounjaro 2.5 mg  weekly  Please return in 4 months.  - we checked her HbA1c: 6.8% (lower) - advised to check sugars at different times of the day - 4x a day, rotating check times - advised for yearly eye exams >> she is UTD - return to clinic in 4 months  2. Obesity class 3  -She previously had increased urination on chlorthalidone  and also perineal infection, so we did not start SGLT2 inhibitors daily.  However, currently on Farxiga , tolerated well.  This should also help with weight loss. - Weight was stable at last visit, previously lost 13 pounds, but she gained 2 pounds since then - At today's visit, we decided to try a low-dose Mounjaro -please see above  3. HL - Latest lipid panel showed an LDL above our target, otherwise fractions at goal: Lab Results  Component Value Date   CHOL 173 04/05/2024   HDL 51.00 04/05/2024   LDLCALC 104 (H) 04/05/2024   LDLDIRECT 156.0 06/13/2018   TRIG 89.0 04/05/2024   CHOLHDL 3 04/05/2024  - She continues atorvastatin 40 mg daily-tolerated well  Lela Fendt, MD PhD [facility] Endocrinology

## 2024-06-16 ENCOUNTER — Other Ambulatory Visit: Payer: Self-pay

## 2024-06-16 ENCOUNTER — Other Ambulatory Visit: Payer: Self-pay | Admitting: Family Medicine

## 2024-06-16 ENCOUNTER — Other Ambulatory Visit (HOSPITAL_BASED_OUTPATIENT_CLINIC_OR_DEPARTMENT_OTHER): Payer: Self-pay

## 2024-06-16 DIAGNOSIS — I10 Essential (primary) hypertension: Secondary | ICD-10-CM

## 2024-06-19 ENCOUNTER — Other Ambulatory Visit (HOSPITAL_BASED_OUTPATIENT_CLINIC_OR_DEPARTMENT_OTHER): Payer: Self-pay

## 2024-06-19 MED ORDER — IRBESARTAN 150 MG PO TABS
150.0000 mg | ORAL_TABLET | Freq: Every day | ORAL | 1 refills | Status: AC
  Filled 2024-06-19: qty 90, 90d supply, fill #0

## 2024-06-20 ENCOUNTER — Other Ambulatory Visit (HOSPITAL_BASED_OUTPATIENT_CLINIC_OR_DEPARTMENT_OTHER): Payer: Self-pay

## 2024-06-21 ENCOUNTER — Other Ambulatory Visit (HOSPITAL_BASED_OUTPATIENT_CLINIC_OR_DEPARTMENT_OTHER): Payer: Self-pay

## 2024-06-22 ENCOUNTER — Other Ambulatory Visit (HOSPITAL_BASED_OUTPATIENT_CLINIC_OR_DEPARTMENT_OTHER): Payer: Self-pay

## 2024-06-22 NOTE — Addendum Note (Signed)
 Addended by: CLEOTILDE ROLIN RAMAN on: 06/22/2024 08:17 AM   Modules accepted: Orders

## 2024-06-23 ENCOUNTER — Encounter (HOSPITAL_BASED_OUTPATIENT_CLINIC_OR_DEPARTMENT_OTHER): Payer: Self-pay

## 2024-06-23 ENCOUNTER — Other Ambulatory Visit (HOSPITAL_BASED_OUTPATIENT_CLINIC_OR_DEPARTMENT_OTHER): Payer: Self-pay

## 2024-06-26 ENCOUNTER — Encounter: Payer: Self-pay | Admitting: Internal Medicine

## 2024-06-28 ENCOUNTER — Other Ambulatory Visit (HOSPITAL_COMMUNITY): Payer: Self-pay

## 2024-06-28 ENCOUNTER — Telehealth: Payer: Self-pay

## 2024-06-28 ENCOUNTER — Other Ambulatory Visit (HOSPITAL_BASED_OUTPATIENT_CLINIC_OR_DEPARTMENT_OTHER): Payer: Self-pay

## 2024-06-28 NOTE — Telephone Encounter (Signed)
 Pharmacy Patient Advocate Encounter   Received notification from Pt Calls Messages that prior authorization for Mounjaro  2.5MG /0.5ML auto-injectors is required/requested.   Insurance verification completed.   The patient is insured through [facility].   Per test claim: PA required; PA submitted to above mentioned insurance via Latent Key/confirmation #/EOC [id number] Status is pending

## 2024-06-29 ENCOUNTER — Other Ambulatory Visit (HOSPITAL_BASED_OUTPATIENT_CLINIC_OR_DEPARTMENT_OTHER): Payer: Self-pay

## 2024-07-03 NOTE — Telephone Encounter (Signed)
 Pharmacy Patient Advocate Encounter  Received notification from [facility] that Prior Authorization for Mounjaro  2.5MG /0.5ML auto-injectors  has been APPROVED from 06/29/2024 to 06/28/2024   PA #/Case ID/Reference #: [id number]

## 2024-08-08 ENCOUNTER — Other Ambulatory Visit (HOSPITAL_BASED_OUTPATIENT_CLINIC_OR_DEPARTMENT_OTHER): Payer: Self-pay

## 2024-09-04 ENCOUNTER — Other Ambulatory Visit: Payer: Self-pay | Admitting: Internal Medicine

## 2024-09-04 ENCOUNTER — Other Ambulatory Visit (HOSPITAL_BASED_OUTPATIENT_CLINIC_OR_DEPARTMENT_OTHER): Payer: Self-pay

## 2024-09-04 ENCOUNTER — Encounter: Payer: Self-pay | Admitting: Internal Medicine

## 2024-09-04 DIAGNOSIS — E1165 Type 2 diabetes mellitus with hyperglycemia: Secondary | ICD-10-CM

## 2024-09-04 MED ORDER — TRESIBA FLEXTOUCH 200 UNIT/ML ~~LOC~~ SOPN
18.0000 [IU] | PEN_INJECTOR | Freq: Every day | SUBCUTANEOUS | 3 refills | Status: AC
  Filled 2024-09-04: qty 9, 90d supply, fill #0

## 2024-10-19 ENCOUNTER — Ambulatory Visit: Admitting: Internal Medicine
